# Patient Record
Sex: Male | Born: 1965 | Race: Black or African American | Hispanic: No | Marital: Single | Smoking: Current every day smoker
Health system: Southern US, Community
[De-identification: ages and names within clinical notes are randomized; demographics above are authoritative.]

## PROBLEM LIST (undated history)

## (undated) DIAGNOSIS — F1010 Alcohol abuse, uncomplicated: Secondary | ICD-10-CM

## (undated) DIAGNOSIS — Z720 Tobacco use: Secondary | ICD-10-CM

## (undated) DIAGNOSIS — F1410 Cocaine abuse, uncomplicated: Secondary | ICD-10-CM

## (undated) DIAGNOSIS — I509 Heart failure, unspecified: Secondary | ICD-10-CM

## (undated) DIAGNOSIS — E119 Type 2 diabetes mellitus without complications: Secondary | ICD-10-CM

## (undated) DIAGNOSIS — I10 Essential (primary) hypertension: Secondary | ICD-10-CM

## (undated) DIAGNOSIS — I161 Hypertensive emergency: Secondary | ICD-10-CM

## (undated) HISTORY — DX: Hypertensive emergency: I16.1

---

## 1999-04-14 ENCOUNTER — Inpatient Hospital Stay (HOSPITAL_COMMUNITY): Admission: EM | Admit: 1999-04-14 | Discharge: 1999-04-17 | Payer: Self-pay | Admitting: *Deleted

## 1999-05-29 ENCOUNTER — Inpatient Hospital Stay (HOSPITAL_COMMUNITY): Admission: EM | Admit: 1999-05-29 | Discharge: 1999-06-02 | Payer: Self-pay | Admitting: *Deleted

## 1999-06-05 ENCOUNTER — Other Ambulatory Visit (HOSPITAL_COMMUNITY): Admission: RE | Admit: 1999-06-05 | Discharge: 1999-06-13 | Payer: Self-pay | Admitting: Psychiatry

## 2021-04-11 ENCOUNTER — Emergency Department (HOSPITAL_COMMUNITY)
Admission: EM | Admit: 2021-04-11 | Discharge: 2021-04-12 | Disposition: A | Discharge status: Home / Self Care | Payer: Medicaid Other | Attending: Emergency Medicine | Admitting: Emergency Medicine

## 2021-04-11 ENCOUNTER — Emergency Department (HOSPITAL_COMMUNITY): Payer: Medicaid Other

## 2021-04-11 ENCOUNTER — Encounter (HOSPITAL_COMMUNITY): Payer: Self-pay | Admitting: Emergency Medicine

## 2021-04-11 ENCOUNTER — Other Ambulatory Visit: Payer: Self-pay

## 2021-04-11 DIAGNOSIS — E119 Type 2 diabetes mellitus without complications: Secondary | ICD-10-CM | POA: Insufficient documentation

## 2021-04-11 DIAGNOSIS — I10 Essential (primary) hypertension: Secondary | ICD-10-CM | POA: Diagnosis not present

## 2021-04-11 DIAGNOSIS — Z20822 Contact with and (suspected) exposure to covid-19: Secondary | ICD-10-CM | POA: Diagnosis not present

## 2021-04-11 DIAGNOSIS — J36 Peritonsillar abscess: Secondary | ICD-10-CM | POA: Diagnosis not present

## 2021-04-11 DIAGNOSIS — E876 Hypokalemia: Secondary | ICD-10-CM | POA: Insufficient documentation

## 2021-04-11 DIAGNOSIS — J029 Acute pharyngitis, unspecified: Secondary | ICD-10-CM

## 2021-04-11 DIAGNOSIS — R0602 Shortness of breath: Secondary | ICD-10-CM | POA: Diagnosis present

## 2021-04-11 HISTORY — DX: Type 2 diabetes mellitus without complications: E11.9

## 2021-04-11 HISTORY — DX: Essential (primary) hypertension: I10

## 2021-04-11 LAB — BASIC METABOLIC PANEL
Anion gap: 12 (ref 5–15)
BUN: 7 mg/dL (ref 6–20)
CO2: 22 mmol/L (ref 22–32)
Calcium: 9.3 mg/dL (ref 8.9–10.3)
Chloride: 104 mmol/L (ref 98–111)
Creatinine, Ser: 0.88 mg/dL (ref 0.61–1.24)
GFR, Estimated: >60 mL/min (ref >60)
Glucose, Bld: 88 mg/dL (ref 70–99)
Potassium: 3.4 mmol/L — ABNORMAL LOW (ref 3.5–5.1)
Sodium: 138 mmol/L (ref 135–145)

## 2021-04-11 LAB — CBC WITH DIFFERENTIAL/PLATELET
Abs Immature Granulocytes: 0.06 10*3/uL (ref 0.00–0.07)
Basophils Absolute: 0 10*3/uL (ref 0.0–0.1)
Basophils Relative: 0 %
Eosinophils Absolute: 0 10*3/uL (ref 0.0–0.5)
Eosinophils Relative: 0 %
HCT: 45.2 % (ref 39.0–52.0)
Hemoglobin: 15.1 g/dL (ref 13.0–17.0)
Immature Granulocytes: 1 %
Lymphocytes Relative: 12 %
Lymphs Abs: 1.4 10*3/uL (ref 0.7–4.0)
MCH: 28 pg (ref 26.0–34.0)
MCHC: 33.4 g/dL (ref 30.0–36.0)
MCV: 83.9 fL (ref 80.0–100.0)
Monocytes Absolute: 1.3 10*3/uL — ABNORMAL HIGH (ref 0.1–1.0)
Monocytes Relative: 11 %
Neutro Abs: 9.1 10*3/uL — ABNORMAL HIGH (ref 1.7–7.7)
Neutrophils Relative %: 76 %
Platelets: 258 10*3/uL (ref 150–400)
RBC: 5.39 MIL/uL (ref 4.22–5.81)
RDW: 13.9 % (ref 11.5–15.5)
WBC: 11.9 10*3/uL — ABNORMAL HIGH (ref 4.0–10.5)
nRBC: 0 % (ref 0.0–0.2)

## 2021-04-11 MED ORDER — ACETAMINOPHEN 325 MG PO TABS
650.0000 mg | ORAL_TABLET | Freq: Once | ORAL | Status: AC
  Administered 2021-04-11: 650 mg via ORAL

## 2021-04-11 NOTE — ED Triage Notes (Signed)
Pt reports cough, "hard to breath", sore throat and feeling poorly.  States he has been dizzy b/c he has not taken his bp medications.

## 2021-04-11 NOTE — ED Provider Notes (Signed)
Emergency Medicine Provider Triage Evaluation Note  Joseph Reese , a 55 y.o. male  was evaluated in triage.  Pt complains of productive cough, shortness of breath, sore throat, and generalized weakness for about a week.  Patient reports he has not been taking his blood pressure medication for the past 3 months, and has had some intermittent dizziness and blurred vision.  He is tolerating p.o.  Review of Systems  Positive: Productive cough, sore throat, weakness Negative: Chest pain, abdominal pain, N/V/D  Physical Exam  BP (!) 162/107   Pulse (!) 109   Temp (!) 102 F (38.9 C) (Oral)   Resp 18   Ht 5\' 9"  (1.753 m)   Wt 97.5 kg   SpO2 98%   BMI 31.75 kg/m  Gen:   Awake, no distress   Resp:  Normal effort  MSK:   Moves extremities without difficulty  Other:  Expiratory wheezing and diffuse rhonchorous lung sounds in all fields  Medical Decision Making  Medically screening exam initiated at 10:02 PM.  Appropriate orders placed.  was informed that the remainder of the evaluation will be completed by another provider, this initial triage assessment does not replace that evaluation, and the importance of remaining in the ED until their evaluation is complete.     [name] 04/11/21 2203    2204, MD 04/17/21 1030

## 2021-04-12 ENCOUNTER — Emergency Department (HOSPITAL_COMMUNITY): Payer: Medicaid Other

## 2021-04-12 LAB — RESP PANEL BY RT-PCR (FLU A&B, COVID) ARPGX2
Influenza A by PCR: NEGATIVE
Influenza B by PCR: NEGATIVE
SARS Coronavirus 2 by RT PCR: NEGATIVE

## 2021-04-12 MED ORDER — DEXAMETHASONE SODIUM PHOSPHATE 10 MG/ML IJ SOLN
10.0000 mg | Freq: Once | INTRAMUSCULAR | Status: AC
  Administered 2021-04-12: 10 mg via INTRAVENOUS
  Filled 2021-04-12: qty 1

## 2021-04-12 MED ORDER — IOHEXOL 300 MG/ML  SOLN
75.0000 mL | Freq: Once | INTRAMUSCULAR | Status: AC | PRN
  Administered 2021-04-12: 75 mL via INTRAVENOUS

## 2021-04-12 MED ORDER — KETOROLAC TROMETHAMINE 15 MG/ML IJ SOLN
15.0000 mg | Freq: Once | INTRAMUSCULAR | Status: AC
  Administered 2021-04-12: 15 mg via INTRAVENOUS
  Filled 2021-04-12: qty 1

## 2021-04-12 MED ORDER — HYDROCODONE-ACETAMINOPHEN 7.5-325 MG/15ML PO SOLN: 15.0000 mL | Freq: Four times a day (QID) | ORAL | 0 refills | Status: DC | PRN

## 2021-04-12 MED ORDER — AMOXICILLIN-POT CLAVULANATE 875-125 MG PO TABS: 1.0000 | ORAL_TABLET | Freq: Two times a day (BID) | ORAL | 0 refills | Status: DC

## 2021-04-12 MED ORDER — NAPROXEN 250 MG PO TABS
500.0000 mg | ORAL_TABLET | Freq: Once | ORAL | Status: AC
  Administered 2021-04-12: 500 mg via ORAL
  Filled 2021-04-12: qty 2

## 2021-04-12 MED ORDER — AZITHROMYCIN 250 MG PO TABS: ORAL_TABLET | ORAL | 0 refills | Status: DC

## 2021-04-12 MED ORDER — SODIUM CHLORIDE 0.9 % IV SOLN
3.0000 g | Freq: Once | INTRAVENOUS | Status: AC
  Administered 2021-04-12: 3 g via INTRAVENOUS
  Filled 2021-04-12: qty 8

## 2021-04-12 MED ORDER — SODIUM CHLORIDE 0.9 % IV BOLUS
500.0000 mL | Freq: Once | INTRAVENOUS | Status: AC
  Administered 2021-04-12: 500 mL via INTRAVENOUS

## 2021-04-12 MED ORDER — CLINDAMYCIN PHOSPHATE 300 MG/50ML IV SOLN: 300.0000 mg | Freq: Once | INTRAVENOUS | Status: DC

## 2021-04-12 NOTE — ED Provider Notes (Signed)
[facility] EMERGENCY DEPARTMENT Provider Note   CSN: [id number] Arrival date & time: 04/11/21  2030     History Chief Complaint  Patient presents with   Shortness of Breath   Cough    Joseph Reese is a 55 y.o. male.  Patient presents with productive cough, lightheadedness, sore throat and weakness for almost 1 week.  Patient not been taking his blood pressure meds for 3 months due to financial reasons.  Intermittent dizziness.  No stroke symptoms or signs.  Tolerating oral liquids.  No chest pain or shortness of breath.  No fevers.      Past Medical History:  Diagnosis Date   Diabetes mellitus without complication (HCC)    Hypertension     There are no problems to display for this patient.        No family history on file.     Home Medications Prior to Admission medications   Medication Sig Start Date End Date Taking? Authorizing Provider  amoxicillin-clavulanate (AUGMENTIN) 875-125 MG tablet Take 1 tablet by mouth every 12 (twelve) hours. 04/12/21  Yes Lorre Nick, MD  HYDROcodone-acetaminophen (HYCET) 7.5-325 mg/15 ml solution Take 15 mLs by mouth every 6 (six) hours as needed for moderate pain. 04/12/21  Yes Lorre Nick, MD    Allergies    Patient has no known allergies.  Review of Systems   Review of Systems  Constitutional:  Negative for chills and fever.  HENT:  Positive for congestion.   Eyes:  Negative for visual disturbance.  Respiratory:  Positive for cough. Negative for shortness of breath.   Cardiovascular:  Negative for chest pain.  Gastrointestinal:  Negative for abdominal pain and vomiting.  Genitourinary:  Negative for dysuria and flank pain.  Musculoskeletal:  Positive for arthralgias. Negative for back pain, neck pain and neck stiffness.  Skin:  Negative for rash.  Neurological:  Positive for light-headedness. Negative for headaches.   Physical Exam Updated Vital Signs BP (!) 152/108   Pulse (!) 105   Temp  98.5 F (36.9 C) (Oral)   Resp 18   Ht 5\' 9"  (1.753 m)   Wt 97.5 kg   SpO2 97%   BMI 31.75 kg/m   Physical Exam Vitals and nursing note reviewed.  Constitutional:      General: He is not in acute distress.    Appearance: He is well-developed.  HENT:     Head: Normocephalic and atraumatic.     Comments: Posterior pharyngeal edema, garbled voice, no stridor, no trismus, mild deviation of uvula    Mouth/Throat:     Mouth: Mucous membranes are moist.     Pharynx: No pharyngeal swelling or oropharyngeal exudate.  Eyes:     General:        Right eye: No discharge.        Left eye: No discharge.     Conjunctiva/sclera: Conjunctivae normal.  Neck:     Trachea: No tracheal deviation.  Cardiovascular:     Rate and Rhythm: Normal rate and regular rhythm.     Heart sounds: No murmur heard. Pulmonary:     Effort: Pulmonary effort is normal.     Breath sounds: Normal breath sounds.  Abdominal:     General: There is no distension.     Palpations: Abdomen is soft.     Tenderness: There is no abdominal tenderness. There is no guarding.  Musculoskeletal:     Cervical back: Normal range of motion and neck supple. No rigidity.  Right lower leg: No edema.     Left lower leg: No edema.  Lymphadenopathy:     Cervical: Cervical adenopathy present.  Skin:    General: Skin is warm.     Capillary Refill: Capillary refill takes less than 2 seconds.     Findings: No rash.  Neurological:     General: No focal deficit present.     Mental Status: He is alert.     Cranial Nerves: No cranial nerve deficit.  Psychiatric:        Mood and Affect: Mood normal.    ED Results / Procedures / Treatments   Labs (all labs ordered are listed, but only abnormal results are displayed) Labs Reviewed  CBC WITH DIFFERENTIAL/PLATELET - Abnormal; Notable for the following components:      Result Value   WBC 11.9 (*)    Neutro Abs 9.1 (*)    Monocytes Absolute 1.3 (*)    All other components within  normal limits  BASIC METABOLIC PANEL - Abnormal; Notable for the following components:   Potassium 3.4 (*)    All other components within normal limits  RESP PANEL BY RT-PCR (FLU A&B, COVID) ARPGX2    EKG None  Radiology DG Chest 2 View  Result Date: 04/11/2021 CLINICAL DATA:  Cough and difficulty breathing. EXAM: CHEST - 2 VIEW COMPARISON:  None. FINDINGS: Low lung volumes are seen with subsequent crowding of the bibasilar bronchovascular lung markings. Very mild bibasilar atelectasis is also noted. There is no evidence of acute infiltrate, pleural effusion or pneumothorax. The heart size and mediastinal contours are within normal limits. The visualized skeletal structures are unremarkable. IMPRESSION: Low lung volumes without acute or active cardiopulmonary disease. Electronically Signed   By: Virgina Norfolk M.D.   On: 04/11/2021 23:04   CT Soft Tissue Neck W Contrast  Result Date: 04/12/2021 CLINICAL DATA:  Neck deep tissue abscess.  Hoarseness.  Neck pain EXAM: CT NECK WITH CONTRAST TECHNIQUE: Multidetector CT imaging of the neck was performed using the standard protocol following the bolus administration of intravenous contrast. CONTRAST:  85mL OMNIPAQUE IOHEXOL 300 MG/ML  SOLN COMPARISON:  None. FINDINGS: Pharynx and larynx: Extensive mucosal and submucosal edema involving the left lateral pharyngeal wall and tonsil. This extends down to the piriform sinus. Within the edema there are 2 small areas of fluid density compatible with small abscesses measuring approximately 10 mm each. Airway is displaced to the right. Mild narrowing of the supraglottic airway. Salivary glands: No inflammation, mass, or stone. Thyroid: Negative Lymph nodes: No enlarged or necrotic lymph nodes in the neck. Vascular: Normal vascular enhancement. Limited intracranial: Negative Visualized orbits: Fracture left orbital floor which appears chronic. Mastoids and visualized paranasal sinuses: Mild mucosal edema right  ethmoid sinuses. Remaining sinuses clear. Mastoid clear. Skeleton: Cervical spondylosis causing spinal and foraminal stenosis at multiple levels. No acute skeletal abnormality. Upper chest: Lung apices clear bilaterally. Other: None IMPRESSION: Extensive soft tissue edema in the left lateral pharynx. The airway is displaced to the right and mildly narrowed. There are 2 small areas of abscess formation within the parapharyngeal edema on the left. Findings compatible with acute infection. Electronically Signed   By: Franchot Gallo M.D.   On: 04/12/2021 18:31    Procedures Procedures   Medications Ordered in ED Medications  acetaminophen (TYLENOL) tablet 650 mg (650 mg Oral Given 04/11/21 2202)  naproxen (NAPROSYN) tablet 500 mg (500 mg Oral Given 04/12/21 0204)  ketorolac (TORADOL) 15 MG/ML injection 15 mg (15 mg Intravenous  Given 04/12/21 1525)  dexamethasone (DECADRON) injection 10 mg (10 mg Intravenous Given 04/12/21 1525)  sodium chloride 0.9 % bolus 500 mL (0 mLs Intravenous Stopped 04/12/21 1610)  iohexol (OMNIPAQUE) 300 MG/ML solution 75 mL (75 mLs Intravenous Contrast Given 04/12/21 1753)  Ampicillin-Sulbactam (UNASYN) 3 g in sodium chloride 0.9 % 100 mL IVPB (0 g Intravenous Stopped 04/12/21 2045)    ED Course  I have reviewed the triage vital signs and the nursing notes.  Pertinent labs & imaging results that were available during my care of the patient were reviewed by me and considered in my medical decision making (see chart for details).    MDM Rules/Calculators/A&P                           Patient presents with clinical concern for pharyngitis either bacterial/viral versus viral syndrome versus atypical pneumonia.  Chest x-ray reviewed no acute infiltrate.  Blood work reviewed reassuring white count 11.9, potassium mild low 3.4.  Patient's vital signs improved with medications.  Patient has not taken blood pressure meds he understands he needs to start taking them and  follow-up outpatient.   On reassessment patient has muffled voice, initially in the hallway and able to get further details on pharyngeal exam with significant edema.  Differential includes peritonsillar abscess/deep space infection.  Discussed with nursing IV placed and plan for CT scan to look for PTA, steroid dose, pain meds and IV fluids.  Final Clinical Impression(s) / ED Diagnoses Final diagnoses:  Acute pharyngitis, unspecified etiology  Primary hypertension  Peritonsillar abscess    Rx / DC Orders ED Discharge Orders          Ordered    azithromycin (ZITHROMAX Z-PAK) 250 MG tablet  Status:  Discontinued        04/12/21 1449    amoxicillin-clavulanate (AUGMENTIN) 875-125 MG tablet  Every 12 hours        04/12/21 2029    HYDROcodone-acetaminophen (HYCET) 7.5-325 mg/15 ml solution  Every 6 hours PRN        04/12/21 2029             Elnora Morrison, MD 04/13/21 1320

## 2021-04-12 NOTE — ED Provider Notes (Signed)
Patient signed to me by Dr. Jodi Mourning pending results of his neck CT was done showed small abscesses.  Patient received therapy from doctor's [facility] earlier and his swallowing is greatly improved.  He has no trismus.  He denies any trouble breathing..  Case discussed with ENT on-call, Dr. Annalee Genta, who recommends patient get a dose of IV Unasyn here and be placed on Augmentin and he will see the patient in follow-up.   Lorre Nick, MD 04/12/21 2028

## 2021-04-12 NOTE — ED Notes (Signed)
Patient friend, Gildardo Pounds, calling to check on the patient, [phone].

## 2021-04-12 NOTE — ED Notes (Signed)
Called for patient no answer

## 2021-05-06 ENCOUNTER — Inpatient Hospital Stay (HOSPITAL_COMMUNITY)
Admission: EM | Admit: 2021-05-06 | Discharge: 2021-05-09 | DRG: 291 | Disposition: A | Discharge status: Home / Self Care | Payer: Medicaid Other | Attending: Internal Medicine | Admitting: Internal Medicine

## 2021-05-06 ENCOUNTER — Encounter (HOSPITAL_COMMUNITY): Payer: Self-pay

## 2021-05-06 ENCOUNTER — Emergency Department (HOSPITAL_COMMUNITY): Payer: Medicaid Other

## 2021-05-06 ENCOUNTER — Other Ambulatory Visit: Payer: Self-pay

## 2021-05-06 ENCOUNTER — Observation Stay (HOSPITAL_COMMUNITY): Payer: Medicaid Other

## 2021-05-06 DIAGNOSIS — E669 Obesity, unspecified: Secondary | ICD-10-CM | POA: Diagnosis present

## 2021-05-06 DIAGNOSIS — E1165 Type 2 diabetes mellitus with hyperglycemia: Secondary | ICD-10-CM | POA: Diagnosis present

## 2021-05-06 DIAGNOSIS — I110 Hypertensive heart disease with heart failure: Secondary | ICD-10-CM | POA: Diagnosis present

## 2021-05-06 DIAGNOSIS — E876 Hypokalemia: Secondary | ICD-10-CM | POA: Diagnosis present

## 2021-05-06 DIAGNOSIS — F1010 Alcohol abuse, uncomplicated: Secondary | ICD-10-CM | POA: Diagnosis present

## 2021-05-06 DIAGNOSIS — J810 Acute pulmonary edema: Secondary | ICD-10-CM

## 2021-05-06 DIAGNOSIS — I429 Cardiomyopathy, unspecified: Secondary | ICD-10-CM | POA: Diagnosis present

## 2021-05-06 DIAGNOSIS — Z6831 Body mass index (BMI) 31.0-31.9, adult: Secondary | ICD-10-CM | POA: Diagnosis not present

## 2021-05-06 DIAGNOSIS — F17210 Nicotine dependence, cigarettes, uncomplicated: Secondary | ICD-10-CM | POA: Diagnosis present

## 2021-05-06 DIAGNOSIS — Z833 Family history of diabetes mellitus: Secondary | ICD-10-CM | POA: Diagnosis not present

## 2021-05-06 DIAGNOSIS — Z5986 Financial insecurity: Secondary | ICD-10-CM

## 2021-05-06 DIAGNOSIS — Z8249 Family history of ischemic heart disease and other diseases of the circulatory system: Secondary | ICD-10-CM

## 2021-05-06 DIAGNOSIS — I5023 Acute on chronic systolic (congestive) heart failure: Secondary | ICD-10-CM | POA: Diagnosis present

## 2021-05-06 DIAGNOSIS — E785 Hyperlipidemia, unspecified: Secondary | ICD-10-CM | POA: Diagnosis present

## 2021-05-06 DIAGNOSIS — Z20822 Contact with and (suspected) exposure to covid-19: Secondary | ICD-10-CM | POA: Diagnosis present

## 2021-05-06 DIAGNOSIS — F1410 Cocaine abuse, uncomplicated: Secondary | ICD-10-CM | POA: Diagnosis present

## 2021-05-06 DIAGNOSIS — Z9114 Patient's other noncompliance with medication regimen: Secondary | ICD-10-CM

## 2021-05-06 DIAGNOSIS — Z79899 Other long term (current) drug therapy: Secondary | ICD-10-CM | POA: Diagnosis not present

## 2021-05-06 DIAGNOSIS — I161 Hypertensive emergency: Secondary | ICD-10-CM | POA: Diagnosis present

## 2021-05-06 DIAGNOSIS — R739 Hyperglycemia, unspecified: Secondary | ICD-10-CM

## 2021-05-06 DIAGNOSIS — J9601 Acute respiratory failure with hypoxia: Secondary | ICD-10-CM

## 2021-05-06 HISTORY — DX: Acute respiratory failure with hypoxia: J96.01

## 2021-05-06 HISTORY — DX: Acute pulmonary edema: J81.0

## 2021-05-06 LAB — ECHOCARDIOGRAM COMPLETE
AR max vel: 3.69 cm2
AV Area VTI: 4.18 cm2
AV Area mean vel: 3.26 cm2
AV Mean grad: 3 mmHg
AV Peak grad: 5.5 mmHg
Ao pk vel: 1.17 m/s
Height: 69 in
MV VTI: 3.39 cm2
S' Lateral: 5.1 cm
Weight: 3456.81 oz

## 2021-05-06 LAB — CBC WITH DIFFERENTIAL/PLATELET
Abs Immature Granulocytes: 0.09 10*3/uL — ABNORMAL HIGH (ref 0.00–0.07)
Basophils Absolute: 0 10*3/uL (ref 0.0–0.1)
Basophils Relative: 0 %
Eosinophils Absolute: 0.2 10*3/uL (ref 0.0–0.5)
Eosinophils Relative: 4 %
HCT: 44.7 % (ref 39.0–52.0)
Hemoglobin: 14 g/dL (ref 13.0–17.0)
Immature Granulocytes: 1 %
Lymphocytes Relative: 47 %
Lymphs Abs: 3.2 10*3/uL (ref 0.7–4.0)
MCH: 26.8 pg (ref 26.0–34.0)
MCHC: 31.3 g/dL (ref 30.0–36.0)
MCV: 85.6 fL (ref 80.0–100.0)
Monocytes Absolute: 0.4 10*3/uL (ref 0.1–1.0)
Monocytes Relative: 6 %
Neutro Abs: 2.9 10*3/uL (ref 1.7–7.7)
Neutrophils Relative %: 42 %
Platelets: 266 10*3/uL (ref 150–400)
RBC: 5.22 MIL/uL (ref 4.22–5.81)
RDW: 13.7 % (ref 11.5–15.5)
WBC: 6.9 10*3/uL (ref 4.0–10.5)
nRBC: 0 % (ref 0.0–0.2)

## 2021-05-06 LAB — RAPID URINE DRUG SCREEN, HOSP PERFORMED
Amphetamines: NOT DETECTED
Barbiturates: NOT DETECTED
Benzodiazepines: NOT DETECTED
Cocaine: POSITIVE — AB
Opiates: NOT DETECTED
Tetrahydrocannabinol: NOT DETECTED

## 2021-05-06 LAB — BASIC METABOLIC PANEL
Anion gap: 8 (ref 5–15)
BUN: 12 mg/dL (ref 6–20)
CO2: 23 mmol/L (ref 22–32)
Calcium: 8.8 mg/dL — ABNORMAL LOW (ref 8.9–10.3)
Chloride: 107 mmol/L (ref 98–111)
Creatinine, Ser: 1 mg/dL (ref 0.61–1.24)
GFR, Estimated: >60 mL/min (ref >60)
Glucose, Bld: 97 mg/dL (ref 70–99)
Potassium: 4.2 mmol/L (ref 3.5–5.1)
Sodium: 138 mmol/L (ref 135–145)

## 2021-05-06 LAB — CBC
HCT: 45.4 % (ref 39.0–52.0)
Hemoglobin: 14.7 g/dL (ref 13.0–17.0)
MCH: 27.2 pg (ref 26.0–34.0)
MCHC: 32.4 g/dL (ref 30.0–36.0)
MCV: 84.1 fL (ref 80.0–100.0)
Platelets: 267 10*3/uL (ref 150–400)
RBC: 5.4 MIL/uL (ref 4.22–5.81)
RDW: 13.8 % (ref 11.5–15.5)
WBC: 7.4 10*3/uL (ref 4.0–10.5)
nRBC: 0 % (ref 0.0–0.2)

## 2021-05-06 LAB — COMPREHENSIVE METABOLIC PANEL
ALT: 32 U/L (ref 0–44)
AST: 41 U/L (ref 15–41)
Albumin: 3.2 g/dL — ABNORMAL LOW (ref 3.5–5.0)
Alkaline Phosphatase: 74 U/L (ref 38–126)
Anion gap: 11 (ref 5–15)
BUN: 11 mg/dL (ref 6–20)
CO2: 19 mmol/L — ABNORMAL LOW (ref 22–32)
Calcium: 8.5 mg/dL — ABNORMAL LOW (ref 8.9–10.3)
Chloride: 106 mmol/L (ref 98–111)
Creatinine, Ser: 1.23 mg/dL (ref 0.61–1.24)
GFR, Estimated: >60 mL/min (ref >60)
Glucose, Bld: 294 mg/dL — ABNORMAL HIGH (ref 70–99)
Potassium: 3.3 mmol/L — ABNORMAL LOW (ref 3.5–5.1)
Sodium: 136 mmol/L (ref 135–145)
Total Bilirubin: 0.6 mg/dL (ref 0.3–1.2)
Total Protein: 7.4 g/dL (ref 6.5–8.1)

## 2021-05-06 LAB — RESP PANEL BY RT-PCR (FLU A&B, COVID) ARPGX2
Influenza A by PCR: NEGATIVE
Influenza B by PCR: NEGATIVE
SARS Coronavirus 2 by RT PCR: NEGATIVE

## 2021-05-06 LAB — I-STAT ARTERIAL BLOOD GAS, ED
Acid-base deficit: 4 mmol/L — ABNORMAL HIGH (ref 0.0–2.0)
Bicarbonate: 23.2 mmol/L (ref 20.0–28.0)
Calcium, Ion: 1.24 mmol/L (ref 1.15–1.40)
HCT: 43 % (ref 39.0–52.0)
Hemoglobin: 14.6 g/dL (ref 13.0–17.0)
O2 Saturation: 96 %
Potassium: 3.2 mmol/L — ABNORMAL LOW (ref 3.5–5.1)
Sodium: 141 mmol/L (ref 135–145)
TCO2: 25 mmol/L (ref 22–32)
pCO2 arterial: 50.1 mmHg — ABNORMAL HIGH (ref 32.0–48.0)
pH, Arterial: 7.274 — ABNORMAL LOW (ref 7.350–7.450)
pO2, Arterial: 95 mmHg (ref 83.0–108.0)

## 2021-05-06 LAB — CREATININE, SERUM
Creatinine, Ser: 1.05 mg/dL (ref 0.61–1.24)
GFR, Estimated: >60 mL/min (ref >60)

## 2021-05-06 LAB — HEMOGLOBIN A1C
Hgb A1c MFr Bld: 6.4 % — ABNORMAL HIGH (ref 4.8–5.6)
Mean Plasma Glucose: 136.98 mg/dL

## 2021-05-06 LAB — LIPID PANEL
Cholesterol: 296 mg/dL — ABNORMAL HIGH (ref 0–200)
HDL: 55 mg/dL (ref >40)
LDL Cholesterol: 219 mg/dL — ABNORMAL HIGH (ref 0–99)
Total CHOL/HDL Ratio: 5.4 RATIO
Triglycerides: 111 mg/dL (ref <150)
VLDL: 22 mg/dL (ref 0–40)

## 2021-05-06 LAB — PROTIME-INR
INR: 1 (ref 0.8–1.2)
Prothrombin Time: 12.9 seconds (ref 11.4–15.2)

## 2021-05-06 LAB — HIV ANTIBODY (ROUTINE TESTING W REFLEX): HIV Screen 4th Generation wRfx: NONREACTIVE

## 2021-05-06 LAB — APTT: aPTT: 27 seconds (ref 24–36)

## 2021-05-06 LAB — TSH: TSH: 0.746 u[IU]/mL (ref 0.350–4.500)

## 2021-05-06 LAB — MAGNESIUM: Magnesium: 2.1 mg/dL (ref 1.7–2.4)

## 2021-05-06 LAB — TROPONIN I (HIGH SENSITIVITY)
Troponin I (High Sensitivity): 32 ng/L — ABNORMAL HIGH (ref <18)
Troponin I (High Sensitivity): 58 ng/L — ABNORMAL HIGH (ref <18)
Troponin I (High Sensitivity): 54 ng/L — ABNORMAL HIGH (ref <18)
Troponin I (High Sensitivity): 65 ng/L — ABNORMAL HIGH (ref <18)

## 2021-05-06 LAB — BRAIN NATRIURETIC PEPTIDE: B Natriuretic Peptide: 571.8 pg/mL — ABNORMAL HIGH (ref 0.0–100.0)

## 2021-05-06 MED ORDER — FUROSEMIDE 10 MG/ML IJ SOLN
40.0000 mg | Freq: Every day | INTRAMUSCULAR | Status: DC
  Administered 2021-05-07 – 2021-05-09 (×3): 40 mg via INTRAVENOUS
  Filled 2021-05-06 (×3): qty 4

## 2021-05-06 MED ORDER — LOSARTAN POTASSIUM 50 MG PO TABS: 50.0000 mg | ORAL_TABLET | Freq: Every day | ORAL | Status: DC

## 2021-05-06 MED ORDER — FUROSEMIDE 10 MG/ML IJ SOLN
40.0000 mg | Freq: Once | INTRAMUSCULAR | Status: AC
  Administered 2021-05-06: 05:00:00 40 mg via INTRAVENOUS
  Filled 2021-05-06: qty 4

## 2021-05-06 MED ORDER — SODIUM CHLORIDE 0.9 % IV SOLN: INTRAVENOUS | Status: DC

## 2021-05-06 MED ORDER — LOSARTAN POTASSIUM 50 MG PO TABS
50.0000 mg | ORAL_TABLET | Freq: Every day | ORAL | Status: DC
  Administered 2021-05-06 – 2021-05-09 (×4): 50 mg via ORAL
  Filled 2021-05-06 (×4): qty 1

## 2021-05-06 MED ORDER — ENOXAPARIN SODIUM 60 MG/0.6ML IJ SOSY
50.0000 mg | PREFILLED_SYRINGE | INTRAMUSCULAR | Status: DC
  Administered 2021-05-06 – 2021-05-08 (×3): 50 mg via SUBCUTANEOUS
  Filled 2021-05-06 (×2): qty 0.6
  Filled 2021-05-06: qty 0.5

## 2021-05-06 MED ORDER — LORAZEPAM 2 MG/ML IJ SOLN
0.5000 mg | Freq: Once | INTRAMUSCULAR | Status: AC
  Administered 2021-05-06: 03:00:00 0.5 mg via INTRAVENOUS
  Filled 2021-05-06: qty 1

## 2021-05-06 MED ORDER — LORAZEPAM 1 MG PO TABS: 1.0000 mg | ORAL_TABLET | ORAL | Status: DC | PRN

## 2021-05-06 MED ORDER — THIAMINE HCL 100 MG/ML IJ SOLN
100.0000 mg | Freq: Every day | INTRAMUSCULAR | Status: DC
  Filled 2021-05-06: qty 2

## 2021-05-06 MED ORDER — ADULT MULTIVITAMIN W/MINERALS CH
1.0000 | ORAL_TABLET | Freq: Every day | ORAL | Status: DC
  Administered 2021-05-06 – 2021-05-09 (×4): 1 via ORAL
  Filled 2021-05-06 (×4): qty 1

## 2021-05-06 MED ORDER — ACETAMINOPHEN 325 MG PO TABS
650.0000 mg | ORAL_TABLET | Freq: Four times a day (QID) | ORAL | Status: DC | PRN
  Administered 2021-05-06 – 2021-05-08 (×2): 650 mg via ORAL
  Filled 2021-05-06 (×2): qty 2

## 2021-05-06 MED ORDER — FOLIC ACID 1 MG PO TABS
1.0000 mg | ORAL_TABLET | Freq: Every day | ORAL | Status: DC
  Administered 2021-05-06 – 2021-05-09 (×4): 1 mg via ORAL
  Filled 2021-05-06 (×4): qty 1

## 2021-05-06 MED ORDER — HYDRALAZINE HCL 20 MG/ML IJ SOLN: 10.0000 mg | INTRAMUSCULAR | Status: DC | PRN

## 2021-05-06 MED ORDER — ATORVASTATIN CALCIUM 40 MG PO TABS
40.0000 mg | ORAL_TABLET | Freq: Every day | ORAL | Status: DC
  Administered 2021-05-06 – 2021-05-09 (×4): 40 mg via ORAL
  Filled 2021-05-06 (×4): qty 1

## 2021-05-06 MED ORDER — FUROSEMIDE 10 MG/ML IJ SOLN
40.0000 mg | Freq: Two times a day (BID) | INTRAMUSCULAR | Status: DC
  Administered 2021-05-06: 13:00:00 40 mg via INTRAVENOUS
  Filled 2021-05-06: qty 4

## 2021-05-06 MED ORDER — NITROGLYCERIN IN D5W 200-5 MCG/ML-% IV SOLN: 0.0000 ug/min | INTRAVENOUS | Status: DC

## 2021-05-06 MED ORDER — ASPIRIN 81 MG PO CHEW
324.0000 mg | CHEWABLE_TABLET | Freq: Once | ORAL | Status: AC
  Administered 2021-05-06: 03:00:00 324 mg via ORAL
  Filled 2021-05-06: qty 4

## 2021-05-06 MED ORDER — POTASSIUM CHLORIDE CRYS ER 20 MEQ PO TBCR
40.0000 meq | EXTENDED_RELEASE_TABLET | Freq: Once | ORAL | Status: AC
  Administered 2021-05-06: 07:00:00 40 meq via ORAL
  Filled 2021-05-06: qty 2

## 2021-05-06 MED ORDER — THIAMINE HCL 100 MG PO TABS
100.0000 mg | ORAL_TABLET | Freq: Every day | ORAL | Status: DC
  Administered 2021-05-06 – 2021-05-09 (×4): 100 mg via ORAL
  Filled 2021-05-06 (×4): qty 1

## 2021-05-06 MED ORDER — HEPARIN SODIUM (PORCINE) 5000 UNIT/ML IJ SOLN
4000.0000 [IU] | Freq: Once | INTRAMUSCULAR | Status: DC
  Filled 2021-05-06: qty 1

## 2021-05-06 MED ORDER — LORAZEPAM 2 MG/ML IJ SOLN: 1.0000 mg | INTRAMUSCULAR | Status: DC | PRN

## 2021-05-06 MED ORDER — NITROGLYCERIN IN D5W 200-5 MCG/ML-% IV SOLN
INTRAVENOUS | Status: AC
  Filled 2021-05-06: qty 250

## 2021-05-06 NOTE — ED Provider Notes (Signed)
[facility] EMERGENCY DEPARTMENT Provider Note   CSN: [id number] Arrival date & time:        History Chief Complaint  Patient presents with   Shortness of Breath    Joseph Reese is a 55 y.o. male.  HPI     This is a 55 year old male with a history of diabetes, hypertension, hyperlipidemia, congestive heart failure who presents with shortness of breath and chest pain.  Patient presents by EMS on CPAP.  Noted to be hypoxic in the 70s upon arrival.  Patient reports that he woke up gasping for breath and "I could not take a deep breath."  Also reports chest pressure.  Denies any history of CAD but states he had a brother that died of heart attack at 33.  States that the pain is pressure-like and anterior.  It is nonradiating.  Shortness of breath was sudden onset.  No recent fevers or cough.  Denies any lower extremity swelling.  Has been unable to take his medication.  His primary physicians are in [place].  I reviewed care everywhere.  I cannot see any hospital notes but I do see a hospital follow-up that indicates an EF of approximately 35%.  Level 5 caveat for acuity of condition  Past Medical History:  Diagnosis Date   Diabetes mellitus without complication (HCC)    Hypertension     Patient Active Problem List   Diagnosis Date Noted   Hypertensive emergency     History reviewed. No pertinent surgical history.     History reviewed. No pertinent family history.     Home Medications Prior to Admission medications   Medication Sig Start Date End Date Taking? Authorizing Provider  amoxicillin-clavulanate (AUGMENTIN) 875-125 MG tablet Take 1 tablet by mouth every 12 (twelve) hours. 04/12/21   Lorre Nick, MD  HYDROcodone-acetaminophen (HYCET) 7.5-325 mg/15 ml solution Take 15 mLs by mouth every 6 (six) hours as needed for moderate pain. 04/12/21   Lorre Nick, MD    Allergies    Patient has no known allergies.  Review of Systems   Review  of Systems  Constitutional:  Negative for fever.  Respiratory:  Positive for chest tightness and shortness of breath.   Cardiovascular:  Negative for leg swelling.  Gastrointestinal:  Negative for abdominal pain, nausea and vomiting.  All other systems reviewed and are negative.  Physical Exam Updated Vital Signs BP (!) 144/94    Pulse 96    Temp 98.2 F (36.8 C) (Oral)    Resp (!) 24    Ht 1.753 m (5\' 9" )    Wt 98 kg    SpO2 98%    BMI 31.91 kg/m   Physical Exam Vitals and nursing note reviewed.  Constitutional:      Appearance: He is well-developed. He is obese. He is ill-appearing and diaphoretic.  HENT:     Head: Normocephalic and atraumatic.  Eyes:     Pupils: Pupils are equal, round, and reactive to light.  Cardiovascular:     Rate and Rhythm: Regular rhythm. Tachycardia present.     Heart sounds: Normal heart sounds. No murmur heard. Pulmonary:     Effort: Tachypnea and accessory muscle usage present. No respiratory distress.     Breath sounds: Normal breath sounds. No wheezing.     Comments: Increased work of breathing, speaking in short sentences, on BiPAP, coarse breath sounds bilaterally Abdominal:     General: Bowel sounds are normal.     Palpations: Abdomen is soft.  Tenderness: There is no abdominal tenderness. There is no rebound.  Musculoskeletal:     Cervical back: Neck supple.     Comments: Trace bilateral lower extremity edema  Lymphadenopathy:     Cervical: No cervical adenopathy.  Skin:    General: Skin is warm.  Neurological:     Mental Status: He is alert and oriented to person, place, and time.  Psychiatric:     Comments: Anxious appearing    ED Results / Procedures / Treatments   Labs (all labs ordered are listed, but only abnormal results are displayed) Labs Reviewed  CBC WITH DIFFERENTIAL/PLATELET - Abnormal; Notable for the following components:      Result Value   Abs Immature Granulocytes 0.09 (*)    All other components within  normal limits  HEMOGLOBIN A1C - Abnormal; Notable for the following components:   Hgb A1c MFr Bld 6.4 (*)    All other components within normal limits  COMPREHENSIVE METABOLIC PANEL - Abnormal; Notable for the following components:   Potassium 3.3 (*)    CO2 19 (*)    Glucose, Bld 294 (*)    Calcium 8.5 (*)    Albumin 3.2 (*)    All other components within normal limits  LIPID PANEL - Abnormal; Notable for the following components:   Cholesterol 296 (*)    LDL Cholesterol 219 (*)    All other components within normal limits  I-STAT ARTERIAL BLOOD GAS, ED - Abnormal; Notable for the following components:   pH, Arterial 7.274 (*)    pCO2 arterial 50.1 (*)    Acid-base deficit 4.0 (*)    Potassium 3.2 (*)    All other components within normal limits  TROPONIN I (HIGH SENSITIVITY) - Abnormal; Notable for the following components:   Troponin I (High Sensitivity) 32 (*)    All other components within normal limits  RESP PANEL BY RT-PCR (FLU A&B, COVID) ARPGX2  PROTIME-INR  APTT  BRAIN NATRIURETIC PEPTIDE  TROPONIN I (HIGH SENSITIVITY)    EKG EKG Interpretation  Date/Time:  Saturday May 06 2021 02:23:47 EST Ventricular Rate:  129 PR Interval:  150 QRS Duration: 100 QT Interval:  331 QTC Calculation: 485 R Axis:   67 Text Interpretation: Sinus tachycardia Left atrial enlargement LVH with secondary repolarization abnormality Anterior ST elevation No prior for comparison Confirmed by Ross Marcus ([id number]) on 05/06/2021 2:53:31 AM  Radiology DG Chest Portable 1 View  Result Date: 05/06/2021 CLINICAL DATA:  Shortness of breath. EXAM: PORTABLE CHEST 1 VIEW COMPARISON:  April 11, 2021 FINDINGS: Mild, diffusely increased interstitial lung markings are noted. Mild to moderate severity areas of atelectasis and/or infiltrate are seen within the bilateral lung bases. There is no evidence of a pleural effusion or pneumothorax. The cardiac silhouette is borderline in size and  unchanged in appearance. The visualized skeletal structures are unremarkable. IMPRESSION: Mild interstitial edema with mild to moderate severity bibasilar atelectasis and/or infiltrate. Electronically Signed   By: Aram Candela M.D.   On: 05/06/2021 02:44    Procedures .Critical Care Performed by: Shon Baton, MD Authorized by: Shon Baton, MD   Critical care provider statement:    Critical care time (minutes):  60   Critical care was necessary to treat or prevent imminent or life-threatening deterioration of the following conditions:  Respiratory failure   Critical care was time spent personally by me on the following activities:  Development of treatment plan with patient or surrogate, discussions with consultants, evaluation of patient's response  to treatment, examination of patient, ordering and review of laboratory studies, ordering and review of radiographic studies, ordering and performing treatments and interventions, pulse oximetry, re-evaluation of patient's condition and review of old charts   Medications Ordered in ED Medications  0.9 %  sodium chloride infusion ( Intravenous Not Given 05/06/21 0248)  heparin injection 4,000 Units (4,000 Units Intravenous Not Given 05/06/21 0248)  furosemide (LASIX) injection 40 mg (has no administration in time range)  aspirin chewable tablet 324 mg (324 mg Oral Given 05/06/21 0259)  LORazepam (ATIVAN) injection 0.5 mg (0.5 mg Intravenous Given 05/06/21 0259)    ED Course  I have reviewed the triage vital signs and the nursing notes.  Pertinent labs & imaging results that were available during my care of the patient were reviewed by me and considered in my medical decision making (see chart for details).  Clinical Course as of 05/06/21 0415  Sat May 06, 2021  0240 spoke with Dr. Allyson Sabal, cardiology.  Does not feel EKG consistent with STEMI.  We will continue to manage patient for likely CHF and hypertensive urgency/emergency.  [CH]  0414 On recheck, patient resting comfortably on BiPAP.  Vital signs have normalized.  O2 sat 100%, heart rate in the 90s, blood pressure has trended down without intervention.  Patient was given a dose of Lasix.  States he feels much better on BiPAP.  Will attempt to wean. [CH]    Clinical Course User Index [CH] Adysen Raphael, Mayer Masker, MD   MDM Rules/Calculators/A&P                          Patient presents in acute respiratory distress.  He has tachypnea with increased work of breathing.  Clinically highly suggestive of flash pulmonary edema and heart failure.  Reports a history of heart failure in the past but has never been on BiPAP.  He is also significantly diaphoretic and having chest discomfort.  Initial EKG shows some anterior ST elevation with lateral depressions and T wave inversions.  Could be rate related although given that he is having chest pain, will initiate STEMI.  Please see clinical course above.  This was canceled by Dr. Allyson Sabal.  Labs obtained and notable for elevated hemoglobin A1c and glucose, no leukocytosis, no significant metabolic derangements, COVID-negative.  Chest x-ray obtained and shows interstitial edema.  I feel this is consistent with patient's presentation.  Troponin slightly elevated at 32.  No prior for comparison.  Could be demand related versus ACS.  Cardiology is following and has consulted on the patient.  We will plan for admission to the hospitalist given hypoxic respiratory failure in the setting of likely CHF.     Final Clinical Impression(s) / ED Diagnoses Final diagnoses:  Acute respiratory failure with hypoxia (HCC)  Acute on chronic systolic heart failure [facility])    Rx / DC Orders ED Discharge Orders     None        Shon Baton, MD 05/06/21 [phone]

## 2021-05-06 NOTE — ED Triage Notes (Signed)
Pt BIB [facility] for eval of SOB acute onset this evening. Pt reports hx of CHF, states has not felt like this before. Pt states he started earlier this evenign w/ sx, EMS reports initial sats in the 70s, placed on CPAP en route w/ improvement of sats to 97 on arrival. EMS admin 125mg  of solumedrol en route. Pt diaphoretic and tachypneic on arrival to ED

## 2021-05-06 NOTE — ED Notes (Signed)
Per Dr. Toniann Fail, pt removed from BiPAP by this RN and tolerating well. Pt placed on 2l O2 via nasal cannula.

## 2021-05-06 NOTE — Progress Notes (Signed)
° °  Progress Note  Patient Name: Joseph Reese Date of Encounter: 05/06/2021  Primary Cardiologist:  New to [facility]  Cardiology consultation note from Dr. Deforest Hoyles reviewed.  Patient presents with hypertensive emergency and flash pulmonary edema, UDS positive for cocaine.  High-sensitivity troponin I levels are minimally elevated and flat pattern not suggestive of ACS.  Records indicate history of hypertension and type 2 diabetes mellitus.  LDL is significantly elevated at 219 as well.  Hemoglobin A1c 6.4%.  ECG shows sinus tachycardia with LVH and repolarization abnormalities.  Echocardiogram shows severe LV dysfunction with LVEF 15 to 20%, global hypokinesis with akinesis of the mid to basal inferoseptal wall, mildly reduced RV contraction as well.  Outside records indicate prior documentation of LV dysfunction as well, LVEF 25 to 30% in January 2021.  Agree with initiation of losartan for now, although he may ultimately be a candidate for Entresto.  Increase Lipitor to 80 mg daily.  Continue to optimize fluid status.  He could have a largely nonischemic cardiomyopathy with history of uncontrolled hypertension and substance abuse, although his risk factor profile is significant and he needs to have exclusion of underlying CAD.  Would suggest having him transferred over to the hospitalist service at [facility], we will follow him and ultimately make plans for a right and left heart catheterization and further medication adjustments.  Signed, Nona Dell, MD  05/06/2021, 1:30 PM

## 2021-05-06 NOTE — H&P (Signed)
History and Physical    Joseph Reese MPN:[id number] DOB: 08/03/1965 DOA: 05/06/2021  PCP: Pcp, No  Patient coming from: Home.  Chief Complaint: Shortness of breath.  HPI: Joseph Reese is a 55 y.o. male with history of hypertension, hyperlipidemia, CHF presents to the ER with sudden onset of shortness of breath when he was sleeping which woke him up from the sleep.  Denies any chest pain.  Patient states last few days he has been mildly short of breath.  Patient states he has history of hypertension which was diagnosed in 2019 and was prescribed medications which he has not taken for the last 3 months because he was not able to afford.  Admits to taking cocaine about 3 days ago drinks alcohol every day and smokes cigarettes.  ED Course: In the ER patient was found to have blood pressure systolic more than 200 diastolic more than 110.  Cardiology on-call was consulted.  EKG shows sinus tachycardia chest x-ray shows congestion.  Cardiology recommended admitting for acute pulmonary edema Lasix 40 mg IV was given but blood pressure improved before nitroglycerin could be started.  BNP is 571 isolated troponin 32 hemoglobin A1c is 6.3.  COVID test negative.  Patient admitted for acute pulmonary edema with hypertensive emergency.  Review of Systems: As per HPI, rest all negative.   Past Medical History:  Diagnosis Date   Diabetes mellitus without complication (HCC)    Hypertension     History reviewed. No pertinent surgical history.   reports that he has been smoking cigarettes. He has never used smokeless tobacco. He reports current alcohol use. He reports current drug use. Drug: Cocaine.  No Known Allergies  Family History  Problem Relation Age of Onset   Diabetes Mellitus II Brother     Prior to Admission medications   Medication Sig Start Date End Date Taking? Authorizing Provider  HYDROcodone-acetaminophen (HYCET) 7.5-325 mg/15 ml solution Take 15 mLs by mouth every 6 (six) hours as  needed for moderate pain. 04/12/21  Yes Lorre Nick, MD  amoxicillin-clavulanate (AUGMENTIN) 875-125 MG tablet Take 1 tablet by mouth every 12 (twelve) hours. Patient not taking: Reported on 05/06/2021 04/12/21   Lorre Nick, MD  atorvastatin (LIPITOR) 40 MG tablet Take 40 mg by mouth daily. Patient not taking: Reported on 05/06/2021 05/25/19   [provider]  carvedilol (COREG) 6.25 MG tablet Take 6.25 mg by mouth 2 (two) times daily. Patient not taking: Reported on 05/06/2021 05/25/19   [provider]  furosemide (LASIX) 40 MG tablet Take 40 mg by mouth daily. Patient not taking: Reported on 05/06/2021 05/25/19   [provider]  losartan (COZAAR) 50 MG tablet Take 50 mg by mouth daily. Patient not taking: Reported on 05/06/2021 05/25/19   [provider]  spironolactone (ALDACTONE) 25 MG tablet Take 25 mg by mouth daily. Patient not taking: Reported on 05/06/2021 05/25/19   [provider]    Physical Exam: Constitutional: Moderately built and nourished. Vitals:   05/06/21 0306 05/06/21 0306 05/06/21 0321 05/06/21 0525  BP:    (!) 134/100  Pulse:   96 (!) 101  Resp:   (!) 24 18  Temp: 98.2 F (36.8 C)     TempSrc: Oral     SpO2:   98% 96%  Weight:  98 kg    Height:  5\' 9"  (1.753 m)     Eyes: Anicteric no pallor. ENMT: No discharge from the ears eyes nose and mouth. Neck: JVD elevated no mass felt.  Respiratory: No rhonchi or crepitations. Cardiovascular: S1-S2 heard. Abdomen: Soft nontender bowel sound present. Musculoskeletal: No edema. Skin: No rash. Neurologic: Alert awake oriented time place and person.  Moves all extremities. Psychiatric: Appears normal.  Normal affect.   Labs on Admission: I have personally reviewed following labs and imaging studies  CBC: Recent Labs  Lab 05/06/21 0226 05/06/21 0249  WBC 6.9  --   NEUTROABS 2.9  --   HGB 14.0 14.6  HCT 44.7 43.0  MCV 85.6  --   PLT 266  --    Basic  Metabolic Panel: Recent Labs  Lab 05/06/21 0233 05/06/21 0249  NA 136 141  K 3.3* 3.2*  CL 106  --   CO2 19*  --   GLUCOSE 294*  --   BUN 11  --   CREATININE 1.23  --   CALCIUM 8.5*  --    GFR: Estimated Creatinine Clearance: 76.5 mL/min (by C-G formula based on SCr of 1.23 mg/dL). Liver Function Tests: Recent Labs  Lab 05/06/21 0233  AST 41  ALT 32  ALKPHOS 74  BILITOT 0.6  PROT 7.4  ALBUMIN 3.2*   No results for input(s): LIPASE, AMYLASE in the last 168 hours. No results for input(s): AMMONIA in the last 168 hours. Coagulation Profile: Recent Labs  Lab 05/06/21 0226  INR 1.0   Cardiac Enzymes: No results for input(s): CKTOTAL, CKMB, CKMBINDEX, TROPONINI in the last 168 hours. BNP (last 3 results) No results for input(s): PROBNP in the last 8760 hours. HbA1C: Recent Labs    05/06/21 0233  HGBA1C 6.4*   CBG: No results for input(s): GLUCAP in the last 168 hours. Lipid Profile: Recent Labs    05/06/21 0233  CHOL 296*  HDL 55  LDLCALC 219*  TRIG 111  CHOLHDL 5.4   Thyroid Function Tests: No results for input(s): TSH, T4TOTAL, FREET4, T3FREE, THYROIDAB in the last 72 hours. Anemia Panel: No results for input(s): VITAMINB12, FOLATE, FERRITIN, TIBC, IRON, RETICCTPCT in the last 72 hours. Urine analysis: No results found for: COLORURINE, APPEARANCEUR, LABSPEC, PHURINE, GLUCOSEU, HGBUR, BILIRUBINUR, KETONESUR, PROTEINUR, UROBILINOGEN, NITRITE, LEUKOCYTESUR Sepsis Labs: @LABRCNTIP (procalcitonin:4,lacticidven:4) ) Recent Results (from the past 240 hour(s))  Resp Panel by RT-PCR (Flu A&B, Covid) Nasopharyngeal Swab     Status: None   Collection Time: 05/06/21  2:27 AM   Specimen: Nasopharyngeal Swab; Nasopharyngeal(NP) swabs in vial transport medium  Result Value Ref Range Status   SARS Coronavirus 2 by RT PCR NEGATIVE NEGATIVE Final    Comment: (NOTE) SARS-CoV-2 target nucleic acids are NOT DETECTED.  The SARS-CoV-2 RNA is generally detectable in  upper respiratory specimens during the acute phase of infection. The lowest concentration of SARS-CoV-2 viral copies this assay can detect is 138 copies/mL. A negative result does not preclude SARS-Cov-2 infection and should not be used as the sole basis for treatment or other patient management decisions. A negative result may occur with  improper specimen collection/handling, submission of specimen other than nasopharyngeal swab, presence of viral mutation(s) within the areas targeted by this assay, and inadequate number of viral copies(<138 copies/mL). A negative result must be combined with clinical observations, patient history, and epidemiological information. The expected result is Negative.  Fact Sheet for Patients:  05/08/21  Fact Sheet for Healthcare Providers:  BloggerCourse.com  This test is no t yet approved or cleared by the [place] FDA and  has been authorized for detection and/or diagnosis of SARS-CoV-2 by FDA under an Emergency Use Authorization (EUA). This EUA will  remain  in effect (meaning this test can be used) for the duration of the COVID-19 declaration under Section 564(b)(1) of the Act, 21 U.S.C.section 360bbb-3(b)(1), unless the authorization is terminated  or revoked sooner.       Influenza A by PCR NEGATIVE NEGATIVE Final   Influenza B by PCR NEGATIVE NEGATIVE Final    Comment: (NOTE) The Xpert Xpress SARS-CoV-2/FLU/RSV plus assay is intended as an aid in the diagnosis of influenza from Nasopharyngeal swab specimens and should not be used as a sole basis for treatment. Nasal washings and aspirates are unacceptable for Xpert Xpress SARS-CoV-2/FLU/RSV testing.  Fact Sheet for Patients: BloggerCourse.com  Fact Sheet for Healthcare Providers: SeriousBroker.it  This test is not yet approved or cleared by the [place] FDA and has been  authorized for detection and/or diagnosis of SARS-CoV-2 by FDA under an Emergency Use Authorization (EUA). This EUA will remain in effect (meaning this test can be used) for the duration of the COVID-19 declaration under Section 564(b)(1) of the Act, 21 U.S.C. section 360bbb-3(b)(1), unless the authorization is terminated or revoked.  Performed at [facility], [address] [place], [place] [address]      Radiological Exams on Admission: DG Chest Portable 1 View  Result Date: 05/06/2021 CLINICAL DATA:  Shortness of breath. EXAM: PORTABLE CHEST 1 VIEW COMPARISON:  April 11, 2021 FINDINGS: Mild, diffusely increased interstitial lung markings are noted. Mild to moderate severity areas of atelectasis and/or infiltrate are seen within the bilateral lung bases. There is no evidence of a pleural effusion or pneumothorax. The cardiac silhouette is borderline in size and unchanged in appearance. The visualized skeletal structures are unremarkable. IMPRESSION: Mild interstitial edema with mild to moderate severity bibasilar atelectasis and/or infiltrate. Electronically Signed   By: Aram Candela M.D.   On: 05/06/2021 02:44    EKG: Independently reviewed.  Sinus tachycardia with LVH and early repolarization changes.  Assessment/Plan Principal Problem:   Acute respiratory failure with hypoxia (HCC) Active Problems:   Hypertensive emergency   Acute pulmonary edema (HCC)    Acute respiratory failure with hypoxia on BiPAP secondary to acute pulmonary edema in the setting of hypertensive emergency -appreciate cardiology consult.  Patient was given Lasix 40 mg IV blood pressure improved without starting nitroglycerin.  Denies any chest pain.  We will try to wean off BiPAP.  Trend cardiac markers check 2D echo.  Based on the response to Lasix we will decide further dose of Lasix.  Follow intake output Daily weights and metabolic panel. Hypertensive emergency patient had stopped taking all  his antihypertensives and diuretics.  Will restart ARB keep patient on as needed IV hydralazine.  If blood pressure does increase again may need nitroglycerin infusion. Cocaine alcohol and tobacco abuse advised about quitting.  We will keep patient on CIWA. History of hyperlipidemia used to be on statins.  Check lipid panel. Mild hypokalemia replace and recheck. Hyperglycemia hemoglobin A1c is 6.4 patient is prediabetic will need nutrition counseling.   DVT prophylaxis: Lovenox. Code Status: Full code. Family Communication: Discussed with patient. Disposition Plan: Home. Consults called: Cardiology. Admission status: Observation.   Eduard Clos MD [facility] Pager [phone].  If 7PM-7AM, please contact night-coverage [email or web address] Password [facility]  05/06/2021, 5:38 AM

## 2021-05-06 NOTE — ED Notes (Signed)
Echo at the bedside °

## 2021-05-06 NOTE — Progress Notes (Addendum)
PROGRESS NOTE    Joseph Reese  OBS:[id number] DOB: 06/16/1965 DOA: 05/06/2021 PCP: Oneita Hurt, No   Brief Narrative:  Joseph Reese is a 55 y.o. male with history of hypertension, hyperlipidemia, CHF presents to the ER with sudden onset of shortness of breath when he was sleeping which woke him up from the sleep.  Denies any chest pain.  Patient states last few days he has been mildly short of breath.  Patient states he has history of hypertension which was diagnosed in 2019 and was prescribed medications which he has not taken for the last 3 months because he was not able to afford.  Admits to taking cocaine about 3 days ago drinks alcohol every day and smokes cigarettes.   ED Course: In the ER patient was found to have blood pressure systolic more than 200 diastolic more than 110.  Cardiology on-call was consulted.  EKG shows sinus tachycardia chest x-ray shows congestion.  Cardiology recommended admitting for acute pulmonary edema Lasix 40 mg IV was given but blood pressure improved before nitroglycerin could be started.  BNP is 571 isolated troponin 32 hemoglobin A1c is 6.3.  COVID test negative.  Patient admitted for acute pulmonary edema with hypertensive emergency.  Assessment & Plan:  Acute hypoxemic respiratory failure in the setting of flash pulmonary edema and hypertensive emergency: -Initially requiring BiPAP however currently on 4 L nasal cannula. -Chest x-ray suggestive of flash pulmonary edema with severely elevated blood pressure -Trend troponin.-58--> 32--> 54--> 65.  BNP: 571.  TSH: WNL - serial EKG.  Transthoracic echo shows severe LV dysfunction with ejection fraction of 15 to 20%, global hypokinesis with akinesis of the mid to basal inferior septal wall, mildly reduced RV contraction as well.-Lasix 40 mg IV  daily -Appreciate cardiology recommendations-plan is right and left heart catheterization  -Strict INO's and daily weight.  Monitor electrolytes closely and kidney  function  Hypertensive emergency: Secondary to noncompliance with medications. -Blood pressure is improving. -Continue ARB and IV hydralazine as needed. -Monitor closely.  Substance abuse disorder: -History of cocaine abuse, tobacco abuse and alcohol abuse -Urine drug is positive for cocaine. -On CIWA protocol.  Continue multivitamin, thiamine and folic acid -Recommend cessation.  Hypokalemia: Replenished.  Repeat BMP tomorrow a.m. magnesium: WNL  Prediabetes: A1c 6.4% -Monitor blood sugar closely.  Hyperlipidemia: Elevated total cholesterol and LDL -Start atorvastatin 80 mg once daily. Obesity with BMI of 31: Healthy lifestyle modifications recommended   DVT prophylaxis: Lovenox Code Status: Full code Family Communication: None present at bedside.  Plan of care discussed with patient in length and he verbalized understanding and agreed with it. Disposition Plan: to be determined  Consultants:  cardilogy  Procedures:  none  Antimicrobials:  none   Status is: Observation    Subjective: Patient seen and examined.  Sitting comfortably on the bed.  On nasal cannula.  Tells me that his breathing is better right now.  Denies chest pain, palpitations, leg swelling, lightheadedness, dizziness, fever, chills, nausea, vomiting.  His last dose of cocaine was 2 days ago.  Objective: Vitals:   05/06/21 1015 05/06/21 1030 05/06/21 1045 05/06/21 1100  BP:  (!) 145/118  (!) 126/92  Pulse: 97 89 91 82  Resp: (!) 22 15 20  (!) 32  Temp:      TempSrc:      SpO2: 99% 98% 98% 98%  Weight:      Height:        Intake/Output Summary (Last 24 hours) at 05/06/2021 1146 Last data filed at  05/06/2021 0725 Gross per 24 hour  Intake --  Output 1000 ml  Net -1000 ml   Filed Weights   05/06/21 0306  Weight: 98 kg    Examination:  General exam: Appears calm and comfortable, on [place], sitting comfortably but appears tired Respiratory system: decrease BS on the bases, no wheezing or  crackles Cardiovascular system: S1 & S2 heard, RRR. No JVD, murmurs, rubs, gallops or clicks. No pedal edema. Gastrointestinal system: Abdomen is nondistended, soft and nontender. No organomegaly or masses felt. Normal bowel sounds heard. Central nervous system: Alert and oriented. No focal neurological deficits. Extremities: Symmetric 5 x 5 power. Skin: No rashes, lesions or ulcers Psychiatry: Judgement and insight appear normal. Mood & affect appropriate.    Data Reviewed: I have personally reviewed following labs and imaging studies  CBC: Recent Labs  Lab 05/06/21 0226 05/06/21 0249 05/06/21 0645  WBC 6.9  --  7.4  NEUTROABS 2.9  --   --   HGB 14.0 14.6 14.7  HCT 44.7 43.0 45.4  MCV 85.6  --  84.1  PLT 266  --  267   Basic Metabolic Panel: Recent Labs  Lab 05/06/21 0233 05/06/21 0249 05/06/21 0535 05/06/21 0645  NA 136 141 138  --   K 3.3* 3.2* 4.2  --   CL 106  --  107  --   CO2 19*  --  23  --   GLUCOSE 294*  --  97  --   BUN 11  --  12  --   CREATININE 1.23  --  1.00 1.05  CALCIUM 8.5*  --  8.8*  --   MG  --   --   --  2.1   GFR: Estimated Creatinine Clearance: 89.6 mL/min (by C-G formula based on SCr of 1.05 mg/dL). Liver Function Tests: Recent Labs  Lab 05/06/21 0233  AST 41  ALT 32  ALKPHOS 74  BILITOT 0.6  PROT 7.4  ALBUMIN 3.2*   No results for input(s): LIPASE, AMYLASE in the last 168 hours. No results for input(s): AMMONIA in the last 168 hours. Coagulation Profile: Recent Labs  Lab 05/06/21 0226  INR 1.0   Cardiac Enzymes: No results for input(s): CKTOTAL, CKMB, CKMBINDEX, TROPONINI in the last 168 hours. BNP (last 3 results) No results for input(s): PROBNP in the last 8760 hours. HbA1C: Recent Labs    05/06/21 0233  HGBA1C 6.4*   CBG: No results for input(s): GLUCAP in the last 168 hours. Lipid Profile: Recent Labs    05/06/21 0233  CHOL 296*  HDL 55  LDLCALC 219*  TRIG 111  CHOLHDL 5.4   Thyroid Function  Tests: Recent Labs    05/06/21 0645  TSH 0.746   Anemia Panel: No results for input(s): VITAMINB12, FOLATE, FERRITIN, TIBC, IRON, RETICCTPCT in the last 72 hours. Sepsis Labs: No results for input(s): PROCALCITON, LATICACIDVEN in the last 168 hours.  Recent Results (from the past 240 hour(s))  Resp Panel by RT-PCR (Flu A&B, Covid) Nasopharyngeal Swab     Status: None   Collection Time: 05/06/21  2:27 AM   Specimen: Nasopharyngeal Swab; Nasopharyngeal(NP) swabs in vial transport medium  Result Value Ref Range Status   SARS Coronavirus 2 by RT PCR NEGATIVE NEGATIVE Final    Comment: (NOTE) SARS-CoV-2 target nucleic acids are NOT DETECTED.  The SARS-CoV-2 RNA is generally detectable in upper respiratory specimens during the acute phase of infection. The lowest concentration of SARS-CoV-2 viral copies this assay can detect is  138 copies/mL. A negative result does not preclude SARS-Cov-2 infection and should not be used as the sole basis for treatment or other patient management decisions. A negative result may occur with  improper specimen collection/handling, submission of specimen other than nasopharyngeal swab, presence of viral mutation(s) within the areas targeted by this assay, and inadequate number of viral copies(<138 copies/mL). A negative result must be combined with clinical observations, patient history, and epidemiological information. The expected result is Negative.  Fact Sheet for Patients:  BloggerCourse.com  Fact Sheet for Healthcare Providers:  SeriousBroker.it  This test is no t yet approved or cleared by the [place] FDA and  has been authorized for detection and/or diagnosis of SARS-CoV-2 by FDA under an Emergency Use Authorization (EUA). This EUA will remain  in effect (meaning this test can be used) for the duration of the COVID-19 declaration under Section 564(b)(1) of the Act, 21 U.S.C.section  360bbb-3(b)(1), unless the authorization is terminated  or revoked sooner.       Influenza A by PCR NEGATIVE NEGATIVE Final   Influenza B by PCR NEGATIVE NEGATIVE Final    Comment: (NOTE) The Xpert Xpress SARS-CoV-2/FLU/RSV plus assay is intended as an aid in the diagnosis of influenza from Nasopharyngeal swab specimens and should not be used as a sole basis for treatment. Nasal washings and aspirates are unacceptable for Xpert Xpress SARS-CoV-2/FLU/RSV testing.  Fact Sheet for Patients: BloggerCourse.com  Fact Sheet for Healthcare Providers: SeriousBroker.it  This test is not yet approved or cleared by the [place] FDA and has been authorized for detection and/or diagnosis of SARS-CoV-2 by FDA under an Emergency Use Authorization (EUA). This EUA will remain in effect (meaning this test can be used) for the duration of the COVID-19 declaration under Section 564(b)(1) of the Act, 21 U.S.C. section 360bbb-3(b)(1), unless the authorization is terminated or revoked.  Performed at [facility], [address], [place], [place] [address]       Radiology Studies: DG Chest Portable 1 View  Result Date: 05/06/2021 CLINICAL DATA:  Shortness of breath. EXAM: PORTABLE CHEST 1 VIEW COMPARISON:  April 11, 2021 FINDINGS: Mild, diffusely increased interstitial lung markings are noted. Mild to moderate severity areas of atelectasis and/or infiltrate are seen within the bilateral lung bases. There is no evidence of a pleural effusion or pneumothorax. The cardiac silhouette is borderline in size and unchanged in appearance. The visualized skeletal structures are unremarkable. IMPRESSION: Mild interstitial edema with mild to moderate severity bibasilar atelectasis and/or infiltrate. Electronically Signed   By: Aram Candela M.D.   On: 05/06/2021 02:44    Scheduled Meds:  enoxaparin (LOVENOX) injection  50 mg Subcutaneous Q24H    folic acid  1 mg Oral Daily   losartan  50 mg Oral Daily   multivitamin with minerals  1 tablet Oral Daily   thiamine  100 mg Oral Daily   Or   thiamine  100 mg Intravenous Daily   Continuous Infusions:   LOS: 0 days   Time spent: 35 minutes   Darcell Yacoub Estill Cotta, MD [facility]  If 7PM-7AM, please contact night-coverage [email or web address] 05/06/2021, 11:46 AM

## 2021-05-06 NOTE — Progress Notes (Signed)
Pt stated he has an 8/10 headache. Notified provider

## 2021-05-06 NOTE — Consult Note (Signed)
[facility] Consult Note   Primary Physician: NONE Primary Cardiologist:  NONE  Reason for Consultation: Acute onset shortness of breath  HPI:    Joseph Reese is a 55 year old gentleman with a past medical history of ? Cardiomyopathy, essential hypertension, obesity, tobacco abuse, and hyperlipidemia, not taking any medications, who was admitted with acute onset of shortness of breath.  In the ED initial vital signs were: Blood pressure 192/133 mmHg, heart rate 129 bpm, respiratory rate 38.  EMS were called to his house for shortness of breath.  The patient reported being unable to breathe in the evening.  Initial sats were in the 70s.  He was placed on CPAP and given 125 mg of Solu-Medrol en route. In the hospital, the ECG from 05/06/2021 02:23, that I reviewed personally, showed sinus tachycardia, rate 129 bpm, LVH and repolarization abnormality.  Initially code STEMI was called but later canceled.  For BP of 210/140 mmHg, patient placed on IV nitroglycerin infusion.  Also given 324 mg of aspirin.  Chest x-ray showing mild interstitial edema with mild to moderate severity bibasilar atelectasis and/or infiltrate   Home Medications Prior to Admission medications   Medication Sig Start Date End Date Taking? Authorizing Provider  amoxicillin-clavulanate (AUGMENTIN) 875-125 MG tablet Take 1 tablet by mouth every 12 (twelve) hours. 04/12/21   Lorre Nick, MD  HYDROcodone-acetaminophen (HYCET) 7.5-325 mg/15 ml solution Take 15 mLs by mouth every 6 (six) hours as needed for moderate pain. 04/12/21   Lorre Nick, MD    Past Medical History: Past Medical History:  Diagnosis Date   Diabetes mellitus without complication (HCC)    Hypertension     Past Surgical History: History reviewed. No pertinent surgical history.  Family History: History reviewed. No pertinent family history.  Social History: Social History   Socioeconomic History   Marital status: Divorced     Spouse name: Not on file   Number of children: Not on file   Years of education: Not on file   Highest education level: Not on file  Occupational History   Not on file  Tobacco Use   Smoking status: Not on file   Smokeless tobacco: Not on file  Substance and Sexual Activity   Alcohol use: Not on file   Drug use: Not on file   Sexual activity: Not on file  Other Topics Concern   Not on file  Social History Narrative   Not on file   Social Determinants of Health   Financial Resource Strain: Not on file  Food Insecurity: Not on file  Transportation Needs: Not on file  Physical Activity: Not on file  Stress: Not on file  Social Connections: Not on file    Allergies:  No Known Allergies   Review of Systems: [y] = yes, [ ]  = no   General: Weight gain [ ] ; Weight loss [ ] ; Anorexia [ ] ; Fatigue [ ] ; Fever [ ] ; Chills [ ] ; Weakness [ ]   Cardiac: Chest pain/pressure [Y]; Resting SOB [Y]; Exertional SOB [Y]; Orthopnea [ ] ; Pedal Edema [ ] ; Palpitations [ ] ; Syncope [ ] ; Presyncope [ ] ; Paroxysmal nocturnal dyspnea[ ]   Pulmonary: Cough [ ] ; Wheezing[ ] ; Hemoptysis[ ] ; Sputum [ ] ; Snoring [ ]   GI: Vomiting[ ] ; Dysphagia[ ] ; Melena[ ] ; Hematochezia [ ] ; Heartburn[ ] ; Abdominal pain [ ] ; Constipation [ ] ; Diarrhea [ ] ; BRBPR [ ]   GU: Hematuria[ ] ; Dysuria [ ] ; Nocturia[ ]   Vascular: Pain in legs with walking [ ] ; Pain  in feet with lying flat [ ] ; Non-healing sores [ ] ; Stroke [ ] ; TIA [ ] ; Slurred speech [ ] ;  Neuro: Headaches[ ] ; Vertigo[ ] ; Seizures[ ] ; Paresthesias[ ] ;Blurred vision [ ] ; Diplopia [ ] ; Vision changes [ ]   Ortho/Skin: Arthritis [ ] ; Joint pain [ ] ; Muscle pain [ ] ; Joint swelling [ ] ; Back Pain [ ] ; Rash [ ]   Psych: Depression[ ] ; Anxiety[ ]   Heme: Bleeding problems [ ] ; Clotting disorders [ ] ; Anemia [ ]   Endocrine: Diabetes [ ] ; Thyroid dysfunction[ ]      Objective:    Vital Signs:   Pulse Rate:  [105-130] 105 (12/24 0245) Resp:  [21-38] 21 (12/24 0245) BP:  (146-210)/(98-140) 146/98 (12/24 0245) SpO2:  [98 %-100 %] 98 % (12/24 0245) FiO2 (%):  [60 %] 60 % (12/24 0225)    Weight change: There were no vitals filed for this visit.  Intake/Output:  No intake or output data in the 24 hours ending 05/06/21 0256    Physical Exam    General: Significantly tachypneic, appears in distress, obese, diaphoretic HEENT: normal Neck: supple. Carotids 2+ bilat; no bruits. No lymphadenopathy or thyromegaly appreciated. Cor: Tachycardic Lungs: Diminished air movement bilaterally, + use of accessory muscles Abdomen: soft, nontender, nondistended. No hepatosplenomegaly. No bruits or masses.  Extremities: no cyanosis, clubbing, rash, trace edema Neuro: alert & orientedx3,  Affect: agitated, anxious    EKG    the ECG from 05/06/2021 02:23, that I reviewed personally, showed sinus tachycardia, rate 129 bpm, LVH and repolarization abnormality.   Labs   Basic Metabolic Panel: Recent Labs  Lab 05/06/21 0249  NA 141  K 3.2*    Liver Function Tests: No results for input(s): AST, ALT, ALKPHOS, BILITOT, PROT, ALBUMIN in the last 168 hours. No results for input(s): LIPASE, AMYLASE in the last 168 hours. No results for input(s): AMMONIA in the last 168 hours.  CBC: Recent Labs  Lab 05/06/21 0226 05/06/21 0249  WBC 6.9  --   NEUTROABS 2.9  --   HGB 14.0 14.6  HCT 44.7 43.0  MCV 85.6  --   PLT 266  --     Cardiac Enzymes: No results for input(s): CKTOTAL, CKMB, CKMBINDEX, TROPONINI in the last 168 hours.  BNP: BNP (last 3 results) No results for input(s): BNP in the last 8760 hours.  ProBNP (last 3 results) No results for input(s): PROBNP in the last 8760 hours.   CBG: No results for input(s): GLUCAP in the last 168 hours.  Coagulation Studies: No results for input(s): LABPROT, INR in the last 72 hours.   Imaging   DG Chest Portable 1 View  Result Date: 05/06/2021 CLINICAL DATA:  Shortness of breath. EXAM: PORTABLE CHEST 1  VIEW COMPARISON:  April 11, 2021 FINDINGS: Mild, diffusely increased interstitial lung markings are noted. Mild to moderate severity areas of atelectasis and/or infiltrate are seen within the bilateral lung bases. There is no evidence of a pleural effusion or pneumothorax. The cardiac silhouette is borderline in size and unchanged in appearance. The visualized skeletal structures are unremarkable. IMPRESSION: Mild interstitial edema with mild to moderate severity bibasilar atelectasis and/or infiltrate. Electronically Signed   By: Virgina Norfolk M.D.   On: 05/06/2021 02:44     Medications:     Current Medications:  aspirin  324 mg Oral Once   heparin  4,000 Units Intravenous Once   LORazepam  0.5 mg Intravenous Once    Infusions:  sodium chloride     nitroGLYCERIN  Assessment/Plan   Hypertensive emergency with flash pulmonary edema  Patient has a history of hypertension but is currently not on any medical therapy.  He reports acute onset of shortness of breath this evening.  Clinical exam and chest x-ray are suggestive of flash pulmonary edema with severely elevated blood pressure. Clinic note from 05/25/2019 (scanned in Care Everywhere) seems to indicate that he has an EF of 25-30% on echo.  -Infusion of IV vasodilator (e.g: nitroglycerin) -Monitor I and Os -IV lasix bolus -Trend cardiac enzymes -Serial ECGs -Check BNP -Correct electrolyte abnormalities (maintain K >4.0 and Mg >2.0) -Transthoracic echocardiogram  If initial troponin elevated then would consider IV heparin   Meade Maw, MD  05/06/2021, 2:56 AM  Cardiology Overnight Team Please contact [facility] for night-coverage after hours (4p -7a ) and weekends on [email or web address]

## 2021-05-06 NOTE — Progress Notes (Signed)
°  Echocardiogram 2D Echocardiogram has been performed.  Joseph Reese 05/06/2021, 9:11 AM

## 2021-05-07 LAB — BASIC METABOLIC PANEL
Anion gap: 8 (ref 5–15)
BUN: 13 mg/dL (ref 6–20)
CO2: 26 mmol/L (ref 22–32)
Calcium: 9 mg/dL (ref 8.9–10.3)
Chloride: 105 mmol/L (ref 98–111)
Creatinine, Ser: 0.96 mg/dL (ref 0.61–1.24)
GFR, Estimated: >60 mL/min (ref >60)
Glucose, Bld: 115 mg/dL — ABNORMAL HIGH (ref 70–99)
Potassium: 3.9 mmol/L (ref 3.5–5.1)
Sodium: 139 mmol/L (ref 135–145)

## 2021-05-07 LAB — MAGNESIUM: Magnesium: 1.9 mg/dL (ref 1.7–2.4)

## 2021-05-07 MED ORDER — POTASSIUM CHLORIDE 20 MEQ PO PACK
40.0000 meq | PACK | Freq: Once | ORAL | Status: AC
  Administered 2021-05-07: 16:00:00 40 meq via ORAL
  Filled 2021-05-07: qty 2

## 2021-05-07 MED ORDER — MAGNESIUM SULFATE 2 GM/50ML IV SOLN
2.0000 g | Freq: Once | INTRAVENOUS | Status: AC
  Administered 2021-05-07: 09:00:00 2 g via INTRAVENOUS
  Filled 2021-05-07: qty 50

## 2021-05-07 MED ORDER — CARVEDILOL 6.25 MG PO TABS
6.2500 mg | ORAL_TABLET | Freq: Two times a day (BID) | ORAL | Status: DC
  Administered 2021-05-07 – 2021-05-09 (×3): 6.25 mg via ORAL
  Filled 2021-05-07 (×4): qty 1

## 2021-05-07 NOTE — Progress Notes (Signed)
PROGRESS NOTE    Joseph Reese  XBM:[id number] DOB: June 19, 1965 DOA: 05/06/2021 PCP: Oneita Hurt, No   Brief Narrative:  Joseph Reese is a 55 y.o. male with history of hypertension, hyperlipidemia, CHF presents to the ER with sudden onset of shortness of breath when he was sleeping which woke him up from the sleep.  Denies any chest pain.  Patient states last few days he has been mildly short of breath.  Patient states he has history of hypertension which was diagnosed in 2019 and was prescribed medications which he has not taken for the last 3 months because he was not able to afford.  Admits to taking cocaine about 3 days ago drinks alcohol every day and smokes cigarettes.   ED Course: In the ER patient was found to have blood pressure systolic more than 200 diastolic more than 110.  Cardiology on-call was consulted.  EKG shows sinus tachycardia chest x-ray shows congestion.  Cardiology recommended admitting for acute pulmonary edema Lasix 40 mg IV was given but blood pressure improved before nitroglycerin could be started.  BNP is 571 isolated troponin 32 hemoglobin A1c is 6.3.  COVID test negative.  Patient admitted for acute pulmonary edema with hypertensive emergency.  Assessment & Plan:  Acute hypoxemic respiratory failure in the setting of flash pulmonary edema and hypertensive emergency: Improving -Initially requiring BiPAP, now on room air -Chest x-ray suggestive of flash pulmonary edema with severely elevated blood pressure -troponin.-58--> 32--> 54--> 65.  BNP: 571.  TSH: WNL -Transthoracic echo shows severe LV dysfunction with ejection fraction of 15 to 20%, global hypokinesis with akinesis of the mid to basal inferior septal wall, mildly reduced RV contraction as well. -Lasix 40 mg IV  daily-transition to p.o. tomorrow.  Added Coreg by cardiology -Appreciate cardiology recommendations-plan is left heart catheterization  -Strict INO's and daily weight.  Monitor electrolytes closely and kidney  function  Hypertensive emergency: Improving -Secondary to noncompliance with medications. -Blood pressure is morning -Continue ARB, Coreg and IV hydralazine as needed. -Monitor closely.  Substance abuse disorder: -History of cocaine abuse, tobacco abuse and alcohol abuse -Urine drug is positive for cocaine. -On CIWA protocol.  Continue multivitamin, thiamine and folic acid -Recommend cessation.  Hypokalemia/hypomagnesemia: Replenished.   -Repeat BMP and magnesium level tomorrow a.m.  Prediabetes: A1c 6.4% -Monitor blood sugar closely.  Hyperlipidemia: Elevated total cholesterol and LDL -Start atorvastatin 80 mg once daily.  Obesity with BMI of 31: Healthy lifestyle modifications recommended   DVT prophylaxis: Lovenox Code Status: Full code Family Communication: None present at bedside.  Plan of care discussed with patient in length and he verbalized understanding and agreed with it. Disposition Plan: to be determined  Consultants:  cardilogy  Procedures:  none  Antimicrobials:  none   Status is: Observation    Subjective: Patient seen and examined.  Sitting comfortably on the bed.  On nasal cannula.  Tells me that patient seen and examined.  Resting comfortably on the bed.  Tells me that he is doing much better this morning and wishes to go home.  Denies chest pain, shortness of breath, leg swelling, orthopnea or PND  Objective: Vitals:   05/06/21 1522 05/06/21 2026 05/07/21 0535 05/07/21 0919  BP: (!) 142/102 127/90 (!) 135/102 (!) 136/92  Pulse: 97 93 (!) 103 (!) 102  Resp: Temp: 98.9 F (37.2 C) 98.9 F (37.2 C) 98.1 F (36.7 C)   TempSrc: Oral Oral Oral   SpO2: 100% 100% 98% 97%  Weight: 101.3  kg  98.3 kg   Height: 5\' 9"  (1.753 m)       Intake/Output Summary (Last 24 hours) at 05/07/2021 1350 Last data filed at 05/07/2021 1310 Gross per 24 hour  Intake 290 ml  Output 2700 ml  Net -2410 ml    Filed Weights   05/06/21 0306 05/06/21  1522 05/07/21 0535  Weight: 98 kg 101.3 kg 98.3 kg    Examination:  General exam: Appears calm and comfortable, on [place], sitting comfortably but appears tired Respiratory system: Clear to auscultation.  No wheezing or rhonchi.   Cardiovascular system: S1 & S2 heard, RRR. No JVD, murmurs, rubs, gallops or clicks. No pedal edema. Gastrointestinal system: Abdomen is nondistended, soft and nontender. No organomegaly or masses felt. Normal bowel sounds heard. Central nervous system: Alert and oriented. No focal neurological deficits. Extremities: Symmetric 5 x 5 power. Skin: No rashes, lesions or ulcers Psychiatry: Judgement and insight appear normal. Mood & affect appropriate.    Data Reviewed: I have personally reviewed following labs and imaging studies  CBC: Recent Labs  Lab 05/06/21 0226 05/06/21 0249 05/06/21 0645  WBC 6.9  --  7.4  NEUTROABS 2.9  --   --   HGB 14.0 14.6 14.7  HCT 44.7 43.0 45.4  MCV 85.6  --  84.1  PLT 266  --  267    Basic Metabolic Panel: Recent Labs  Lab 05/06/21 0233 05/06/21 0249 05/06/21 0535 05/06/21 0645 05/07/21 0334  NA 136 141 138  --  139  K 3.3* 3.2* 4.2  --  3.9  CL 106  --  107  --  105  CO2 19*  --  23  --  26  GLUCOSE 294*  --  97  --  115*  BUN 11  --  12  --  13  CREATININE 1.23  --  1.00 1.05 0.96  CALCIUM 8.5*  --  8.8*  --  9.0  MG  --   --   --  2.1 1.9    GFR: Estimated Creatinine Clearance: 98.1 mL/min (by C-G formula based on SCr of 0.96 mg/dL). Liver Function Tests: Recent Labs  Lab 05/06/21 0233  AST 41  ALT 32  ALKPHOS 74  BILITOT 0.6  PROT 7.4  ALBUMIN 3.2*    No results for input(s): LIPASE, AMYLASE in the last 168 hours. No results for input(s): AMMONIA in the last 168 hours. Coagulation Profile: Recent Labs  Lab 05/06/21 0226  INR 1.0    Cardiac Enzymes: No results for input(s): CKTOTAL, CKMB, CKMBINDEX, TROPONINI in the last 168 hours. BNP (last 3 results) No results for input(s): PROBNP in  the last 8760 hours. HbA1C: Recent Labs    05/06/21 0233  HGBA1C 6.4*    CBG: No results for input(s): GLUCAP in the last 168 hours. Lipid Profile: Recent Labs    05/06/21 0233  CHOL 296*  HDL 55  LDLCALC 219*  TRIG 111  CHOLHDL 5.4    Thyroid Function Tests: Recent Labs    05/06/21 0645  TSH 0.746    Anemia Panel: No results for input(s): VITAMINB12, FOLATE, FERRITIN, TIBC, IRON, RETICCTPCT in the last 72 hours. Sepsis Labs: No results for input(s): PROCALCITON, LATICACIDVEN in the last 168 hours.  Recent Results (from the past 240 hour(s))  Resp Panel by RT-PCR (Flu A&B, Covid) Nasopharyngeal Swab     Status: None   Collection Time: 05/06/21  2:27 AM   Specimen: Nasopharyngeal Swab; Nasopharyngeal(NP) swabs in vial transport medium  Result Value Ref Range Status   SARS Coronavirus 2 by RT PCR NEGATIVE NEGATIVE Final    Comment: (NOTE) SARS-CoV-2 target nucleic acids are NOT DETECTED.  The SARS-CoV-2 RNA is generally detectable in upper respiratory specimens during the acute phase of infection. The lowest concentration of SARS-CoV-2 viral copies this assay can detect is 138 copies/mL. A negative result does not preclude SARS-Cov-2 infection and should not be used as the sole basis for treatment or other patient management decisions. A negative result may occur with  improper specimen collection/handling, submission of specimen other than nasopharyngeal swab, presence of viral mutation(s) within the areas targeted by this assay, and inadequate number of viral copies(<138 copies/mL). A negative result must be combined with clinical observations, patient history, and epidemiological information. The expected result is Negative.  Fact Sheet for Patients:  BloggerCourse.com  Fact Sheet for Healthcare Providers:  SeriousBroker.it  This test is no t yet approved or cleared by the [place] FDA and  has been  authorized for detection and/or diagnosis of SARS-CoV-2 by FDA under an Emergency Use Authorization (EUA). This EUA will remain  in effect (meaning this test can be used) for the duration of the COVID-19 declaration under Section 564(b)(1) of the Act, 21 U.S.C.section 360bbb-3(b)(1), unless the authorization is terminated  or revoked sooner.       Influenza A by PCR NEGATIVE NEGATIVE Final   Influenza B by PCR NEGATIVE NEGATIVE Final    Comment: (NOTE) The Xpert Xpress SARS-CoV-2/FLU/RSV plus assay is intended as an aid in the diagnosis of influenza from Nasopharyngeal swab specimens and should not be used as a sole basis for treatment. Nasal washings and aspirates are unacceptable for Xpert Xpress SARS-CoV-2/FLU/RSV testing.  Fact Sheet for Patients: BloggerCourse.com  Fact Sheet for Healthcare Providers: SeriousBroker.it  This test is not yet approved or cleared by the [place] FDA and has been authorized for detection and/or diagnosis of SARS-CoV-2 by FDA under an Emergency Use Authorization (EUA). This EUA will remain in effect (meaning this test can be used) for the duration of the COVID-19 declaration under Section 564(b)(1) of the Act, 21 U.S.C. section 360bbb-3(b)(1), unless the authorization is terminated or revoked.  Performed at [facility], [address] [place], [place] [address]        Radiology Studies: DG Chest Portable 1 View  Result Date: 05/06/2021 CLINICAL DATA:  Shortness of breath. EXAM: PORTABLE CHEST 1 VIEW COMPARISON:  April 11, 2021 FINDINGS: Mild, diffusely increased interstitial lung markings are noted. Mild to moderate severity areas of atelectasis and/or infiltrate are seen within the bilateral lung bases. There is no evidence of a pleural effusion or pneumothorax. The cardiac silhouette is borderline in size and unchanged in appearance. The visualized skeletal structures are  unremarkable. IMPRESSION: Mild interstitial edema with mild to moderate severity bibasilar atelectasis and/or infiltrate. Electronically Signed   By: Aram Candela M.D.   On: 05/06/2021 02:44   ECHOCARDIOGRAM COMPLETE  Result Date: 05/06/2021    ECHOCARDIOGRAM REPORT   Patient Name:   MECCA BARGA Date of Exam: 05/06/2021 Medical Rec #:  [id number]   Height:       69.0 in Accession #:    [id number]  Weight:       216.0 lb Date of Birth:  May 07, 1966    BSA:          2.135 m Patient Age:    55 years    BP:  134/100 mmHg Patient Gender: M           HR:           84 bpm. Exam Location:  Inpatient Procedure: 2D Echo, Cardiac Doppler and Color Doppler Indications:    CHF  History:        Patient has no prior history of Echocardiogram examinations.                 CHF, Signs/Symptoms:Shortness of Breath; Risk Factors:Diabetes,                 Hypertension, Dyslipidemia and Current Smoker. Cocaine use.  Sonographer:    Ross Ludwig RDCS (AE) Referring Phys: [id number] [address]eryle Ready [facility] IMPRESSIONS  1. Left ventricular ejection fraction, by estimation, is 15-20%. The left ventricle has severely decreased function. The left ventricle demonstrates regional wall motion abnormalities (see scoring diagram/findings for description). There is moderate left ventricular hypertrophy. Left ventricular diastolic parameters were normal.  2. Right ventricular systolic function is mildly reduced. The right ventricular size is normal. Tricuspid regurgitation signal is inadequate for assessing PA pressure.  3. Left atrial size was mild to moderately dilated.  4. The mitral valve is grossly normal. Mild mitral valve regurgitation.  5. The aortic valve is tricuspid. There is mild calcification of the aortic valve. Aortic valve regurgitation is mild. No aortic stenosis is present. Aortic valve mean gradient measures 3.0 mmHg.  6. The inferior vena cava is normal in size with greater than 50% respiratory variability, suggesting right  atrial pressure of 3 mmHg. Comparison(s): No prior Echocardiogram. FINDINGS  Left Ventricle: Left ventricular ejection fraction, by estimation, is 15-20%. The left ventricle has severely decreased function. The left ventricle demonstrates regional wall motion abnormalities. The left ventricular internal cavity size was normal in  size. There is moderate left ventricular hypertrophy. Left ventricular diastolic parameters were normal.  LV Wall Scoring: The inferior wall, mid inferolateral segment, mid inferoseptal segment, and basal inferoseptal segment are akinetic. The entire anterior wall, antero-lateral wall, entire anterior septum, entire apex, and basal inferolateral segment are hypokinetic. Right Ventricle: The right ventricular size is normal. No increase in right ventricular wall thickness. Right ventricular systolic function is mildly reduced. Tricuspid regurgitation signal is inadequate for assessing PA pressure. Left Atrium: Left atrial size was mild to moderately dilated. Right Atrium: Right atrial size was normal in size. Pericardium: There is no evidence of pericardial effusion. Mitral Valve: The mitral valve is grossly normal. There is mild thickening of the mitral valve leaflet(s). Mild mitral valve regurgitation. MV peak gradient, 3.0 mmHg. The mean mitral valve gradient is 2.0 mmHg. Tricuspid Valve: The tricuspid valve is grossly normal. Tricuspid valve regurgitation is trivial. Aortic Valve: The aortic valve is tricuspid. There is mild calcification of the aortic valve. There is mild aortic valve annular calcification. Aortic valve regurgitation is mild. No aortic stenosis is present. Aortic valve mean gradient measures 3.0 mmHg. Aortic valve peak gradient measures 5.5 mmHg. Aortic valve area, by VTI measures 4.18 cm. Pulmonic Valve: The pulmonic valve was grossly normal. Pulmonic valve regurgitation is trivial. Aorta: The aortic root is normal in size and structure. Venous: The inferior vena cava  is normal in size with greater than 50% respiratory variability, suggesting right atrial pressure of 3 mmHg. IAS/Shunts: No atrial level shunt detected by color flow Doppler.  LEFT VENTRICLE PLAX 2D LVIDd:         5.60 cm   Diastology LVIDs:  5.10 cm   LV e' medial:  5.77 cm/s LV PW:         2.00 cm   LV e' lateral: 7.15 cm/s LV IVS:        1.60 cm LVOT diam:     2.60 cm LV SV:         75 LV SV Index:   35 LVOT Area:     5.31 cm  RIGHT VENTRICLE            IVC RV Basal diam:  3.50 cm    IVC diam: 1.90 cm RV S prime:     8.73 cm/s TAPSE (M-mode): 1.7 cm LEFT ATRIUM             Index        RIGHT ATRIUM           Index LA diam:        5.40 cm 2.53 cm/m   RA Area:     11.10 cm LA Vol (A2C):   99.0 ml 46.37 ml/m  RA Volume:   23.30 ml  10.91 ml/m LA Vol (A4C):   91.3 ml 42.77 ml/m LA Biplane Vol: 98.5 ml 46.14 ml/m  AORTIC VALVE AV Area (Vmax):    3.69 cm AV Area (Vmean):   3.26 cm AV Area (VTI):     4.18 cm AV Vmax:           117.00 cm/s AV Vmean:          84.200 cm/s AV VTI:            0.179 m AV Peak Grad:      5.5 mmHg AV Mean Grad:      3.0 mmHg LVOT Vmax:         81.30 cm/s LVOT Vmean:        51.700 cm/s LVOT VTI:          0.141 m LVOT/AV VTI ratio: 0.79  AORTA Ao Root diam: 3.60 cm Ao Asc diam:  3.50 cm MITRAL VALVE MV Area VTI:  3.39 cm   SHUNTS MV Peak grad: 3.0 mmHg   Systemic VTI:  0.14 m MV Mean grad: 2.0 mmHg   Systemic Diam: 2.60 cm MV Vmax:      0.87 m/s MV Vmean:     65.4 cm/s Nona Dell MD Electronically signed by Nona Dell MD Signature Date/Time: 05/06/2021/1:11:44 PM    Final     Scheduled Meds:  atorvastatin  40 mg Oral Daily   carvedilol  6.25 mg Oral BID WC   enoxaparin (LOVENOX) injection  50 mg Subcutaneous Q24H   folic acid  1 mg Oral Daily   furosemide  40 mg Intravenous Daily   losartan  50 mg Oral Daily   multivitamin with minerals  1 tablet Oral Daily   thiamine  100 mg Oral Daily   Or   thiamine  100 mg Intravenous Daily   Continuous Infusions:    LOS: 1 day   Time spent: 35 minutes   Greer Koeppen Estill Cotta, MD [facility]  If 7PM-7AM, please contact night-coverage [email or web address] 05/07/2021, 1:50 PM

## 2021-05-07 NOTE — Progress Notes (Addendum)
Progress Note  Patient Name: Joseph Reese Date of Encounter: 05/07/2021  [facility] Cardiologist: used to be followed by cardiologist in [place]  Subjective   Denies any CP or SOB. Felt his breathing is back to baseline  Inpatient Medications    Scheduled Meds:  atorvastatin  40 mg Oral Daily   enoxaparin (LOVENOX) injection  50 mg Subcutaneous Q24H   folic acid  1 mg Oral Daily   furosemide  40 mg Intravenous Daily   losartan  50 mg Oral Daily   multivitamin with minerals  1 tablet Oral Daily   thiamine  100 mg Oral Daily   Or   thiamine  100 mg Intravenous Daily   Continuous Infusions:  magnesium sulfate bolus IVPB     PRN Meds: acetaminophen, hydrALAZINE, LORazepam **OR** LORazepam   Vital Signs    Vitals:   05/06/21 1445 05/06/21 1522 05/06/21 2026 05/07/21 0535  BP:  (!) 142/102 127/90 (!) 135/102  Pulse: 90 97 93 (!) 103  Resp: (!) Temp:  98.9 F (37.2 C) 98.9 F (37.2 C) 98.1 F (36.7 C)  TempSrc:  Oral Oral Oral  SpO2: 98% 100% 100% 98%  Weight:  101.3 kg  98.3 kg  Height:   (1.753 m)      Intake/Output Summary (Last 24 hours) at 05/07/2021 0801 Last data filed at 05/07/2021 0745 Gross per 24 hour  Intake 240 ml  Output 300 ml  Net -60 ml   Last 3 Weights 05/07/2021 05/06/2021 05/06/2021  Weight (lbs) 216 lb 11.2 oz 223 lb 5.2 oz 216 lb 0.8 oz  Weight (kg) 98.294 kg 101.3 kg 98 kg      Telemetry    NSR without significant ventricular ectopy - Personally Reviewed  ECG    Sinus tachycardia, ST depression in the lateral leads - Personally Reviewed  Physical Exam   GEN: No acute distress.   Neck: No JVD Cardiac: RRR, no murmurs, rubs, or gallops.  Respiratory: Clear to auscultation bilaterally. GI: Soft, nontender, non-distended  MS: No edema; No deformity. Neuro:  Nonfocal  Psych: Normal affect   Labs    High Sensitivity Troponin:   Recent Labs  Lab 05/06/21 0222 05/06/21 0226 05/06/21 0535  05/06/21 0645  TROPONINIHS 58* 32* 54* 65*     Chemistry Recent Labs  Lab 05/06/21 0233 05/06/21 0249 05/06/21 0535 05/06/21 0645 05/07/21 0334  NA 136 141 138  --  139  K 3.3* 3.2* 4.2  --  3.9  CL 106  --  107  --  105  CO2 19*  --  23  --  26  GLUCOSE 294*  --  97  --  115*  BUN 11  --  12  --  13  CREATININE 1.23  --  1.00 1.05 0.96  CALCIUM 8.5*  --  8.8*  --  9.0  MG  --   --   --  2.1 1.9  PROT 7.4  --   --   --   --   ALBUMIN 3.2*  --   --   --   --   AST 41  --   --   --   --   ALT 32  --   --   --   --   ALKPHOS 74  --   --   --   --   BILITOT 0.6  --   --   --   --  GFRNONAA >60  --  >60 >60 >60  ANIONGAP 11  --  8  --  8    Lipids  Recent Labs  Lab 05/06/21 0233  CHOL 296*  TRIG 111  HDL 55  LDLCALC 219*  CHOLHDL 5.4    Hematology Recent Labs  Lab 05/06/21 0226 05/06/21 0249 05/06/21 0645  WBC 6.9  --  7.4  RBC 5.22  --  5.40  HGB 14.0 14.6 14.7  HCT 44.7 43.0 45.4  MCV 85.6  --  84.1  MCH 26.8  --  27.2  MCHC 31.3  --  32.4  RDW 13.7  --  13.8  PLT 266  --  267   Thyroid  Recent Labs  Lab 05/06/21 0645  TSH 0.746    BNP Recent Labs  Lab 05/06/21 0226  BNP 571.8*    DDimer No results for input(s): DDIMER in the last 168 hours.   Radiology    DG Chest Portable 1 View  Result Date: 05/06/2021 CLINICAL DATA:  Shortness of breath. EXAM: PORTABLE CHEST 1 VIEW COMPARISON:  April 11, 2021 FINDINGS: Mild, diffusely increased interstitial lung markings are noted. Mild to moderate severity areas of atelectasis and/or infiltrate are seen within the bilateral lung bases. There is no evidence of a pleural effusion or pneumothorax. The cardiac silhouette is borderline in size and unchanged in appearance. The visualized skeletal structures are unremarkable. IMPRESSION: Mild interstitial edema with mild to moderate severity bibasilar atelectasis and/or infiltrate. Electronically Signed   By: Aram Candela M.D.   On: 05/06/2021 02:44    ECHOCARDIOGRAM COMPLETE  Result Date: 05/06/2021    ECHOCARDIOGRAM REPORT   Patient Name:   Joseph Reese Date of Exam: 05/06/2021 Medical Rec #:  [id number]   Height:       69.0 in Accession #:    [id number]  Weight:       216.0 lb Date of Birth:  Apr 07, 1966    BSA:          2.135 m Patient Age:    55 years    BP:           134/100 mmHg Patient Gender: M           HR:           84 bpm. Exam Location:  Inpatient Procedure: 2D Echo, Cardiac Doppler and Color Doppler Indications:    CHF  History:        Patient has no prior history of Echocardiogram examinations.                 CHF, Signs/Symptoms:Shortness of Breath; Risk Factors:Diabetes,                 Hypertension, Dyslipidemia and Current Smoker. Cocaine use.  Sonographer:    Ross Ludwig RDCS (AE) Referring Phys: [id number] Meryle Ready [facility] IMPRESSIONS  1. Left ventricular ejection fraction, by estimation, is 15-20%. The left ventricle has severely decreased function. The left ventricle demonstrates regional wall motion abnormalities (see scoring diagram/findings for description). There is moderate left ventricular hypertrophy. Left ventricular diastolic parameters were normal.  2. Right ventricular systolic function is mildly reduced. The right ventricular size is normal. Tricuspid regurgitation signal is inadequate for assessing PA pressure.  3. Left atrial size was mild to moderately dilated.  4. The mitral valve is grossly normal. Mild mitral valve regurgitation.  5. The aortic valve is tricuspid. There is mild calcification of the aortic valve. Aortic valve regurgitation is mild. No aortic  stenosis is present. Aortic valve mean gradient measures 3.0 mmHg.  6. The inferior vena cava is normal in size with greater than 50% respiratory variability, suggesting right atrial pressure of 3 mmHg. Comparison(s): No prior Echocardiogram. FINDINGS  Left Ventricle: Left ventricular ejection fraction, by estimation, is 15-20%. The left ventricle has severely decreased  function. The left ventricle demonstrates regional wall motion abnormalities. The left ventricular internal cavity size was normal in  size. There is moderate left ventricular hypertrophy. Left ventricular diastolic parameters were normal.  LV Wall Scoring: The inferior wall, mid inferolateral segment, mid inferoseptal segment, and basal inferoseptal segment are akinetic. The entire anterior wall, antero-lateral wall, entire anterior septum, entire apex, and basal inferolateral segment are hypokinetic. Right Ventricle: The right ventricular size is normal. No increase in right ventricular wall thickness. Right ventricular systolic function is mildly reduced. Tricuspid regurgitation signal is inadequate for assessing PA pressure. Left Atrium: Left atrial size was mild to moderately dilated. Right Atrium: Right atrial size was normal in size. Pericardium: There is no evidence of pericardial effusion. Mitral Valve: The mitral valve is grossly normal. There is mild thickening of the mitral valve leaflet(s). Mild mitral valve regurgitation. MV peak gradient, 3.0 mmHg. The mean mitral valve gradient is 2.0 mmHg. Tricuspid Valve: The tricuspid valve is grossly normal. Tricuspid valve regurgitation is trivial. Aortic Valve: The aortic valve is tricuspid. There is mild calcification of the aortic valve. There is mild aortic valve annular calcification. Aortic valve regurgitation is mild. No aortic stenosis is present. Aortic valve mean gradient measures 3.0 mmHg. Aortic valve peak gradient measures 5.5 mmHg. Aortic valve area, by VTI measures 4.18 cm. Pulmonic Valve: The pulmonic valve was grossly normal. Pulmonic valve regurgitation is trivial. Aorta: The aortic root is normal in size and structure. Venous: The inferior vena cava is normal in size with greater than 50% respiratory variability, suggesting right atrial pressure of 3 mmHg. IAS/Shunts: No atrial level shunt detected by color flow Doppler.  LEFT VENTRICLE PLAX  2D LVIDd:         5.60 cm   Diastology LVIDs:         5.10 cm   LV e' medial:  5.77 cm/s LV PW:         2.00 cm   LV e' lateral: 7.15 cm/s LV IVS:        1.60 cm LVOT diam:     2.60 cm LV SV:         75 LV SV Index:   35 LVOT Area:     5.31 cm  RIGHT VENTRICLE            IVC RV Basal diam:  3.50 cm    IVC diam: 1.90 cm RV S prime:     8.73 cm/s TAPSE (M-mode): 1.7 cm LEFT ATRIUM             Index        RIGHT ATRIUM           Index LA diam:        5.40 cm 2.53 cm/m   RA Area:     11.10 cm LA Vol (A2C):   99.0 ml 46.37 ml/m  RA Volume:   23.30 ml  10.91 ml/m LA Vol (A4C):   91.3 ml 42.77 ml/m LA Biplane Vol: 98.5 ml 46.14 ml/m  AORTIC VALVE AV Area (Vmax):    3.69 cm AV Area (Vmean):   3.26 cm AV Area (VTI):     4.18  cm AV Vmax:           117.00 cm/s AV Vmean:          84.200 cm/s AV VTI:            0.179 m AV Peak Grad:      5.5 mmHg AV Mean Grad:      3.0 mmHg LVOT Vmax:         81.30 cm/s LVOT Vmean:        51.700 cm/s LVOT VTI:          0.141 m LVOT/AV VTI ratio: 0.79  AORTA Ao Root diam: 3.60 cm Ao Asc diam:  3.50 cm MITRAL VALVE MV Area VTI:  3.39 cm   SHUNTS MV Peak grad: 3.0 mmHg   Systemic VTI:  0.14 m MV Mean grad: 2.0 mmHg   Systemic Diam: 2.60 cm MV Vmax:      0.87 m/s MV Vmean:     65.4 cm/s Nona Dell MD Electronically signed by Nona Dell MD Signature Date/Time: 05/06/2021/1:11:44 PM    Final     Cardiac Studies   Echo 05/06/2021 IMPRESSIONS     1. Left ventricular ejection fraction, by estimation, is 15-20%. The left  ventricle has severely decreased function. The left ventricle demonstrates  regional wall motion abnormalities (see scoring diagram/findings for  description). There is moderate  left ventricular hypertrophy. Left ventricular diastolic parameters were  normal.   2. Right ventricular systolic function is mildly reduced. The right  ventricular size is normal. Tricuspid regurgitation signal is inadequate  for assessing PA pressure.   3. Left atrial size  was mild to moderately dilated.   4. The mitral valve is grossly normal. Mild mitral valve regurgitation.   5. The aortic valve is tricuspid. There is mild calcification of the  aortic valve. Aortic valve regurgitation is mild. No aortic stenosis is  present. Aortic valve mean gradient measures 3.0 mmHg.   6. The inferior vena cava is normal in size with greater than 50%  respiratory variability, suggesting right atrial pressure of 3 mmHg.   Comparison(s): No prior Echocardiogram.  Patient Profile     55 y.o. male with PMH of HTN, HLD, tobacco abuse, and cardiomyopathy presented with hypertensive emergency and acute CHF.   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   - I/O -1.3 L. Volume status near euvolemic level. Consider addition of home dose of coreg 6.25mg  BID (nonselective BB better in case he does cocaine again) and 12.5mg  daily spironolactone (home dose  daily). Potentially transition to  daily PO lasix tomorrow. Hesitant to add entresto or SGLT 2i given financial restraint unless he qualifies for medication assistance program.   - per patient, he works as a Designer, fashion/clothing. Used to follow by a cardiologist in [place]. He has not been able to pay for medications for the past 3 month. Denies any prior cardiac catheterization or stress testing. Likely Sonya Gunnoe need L and R heart cath for ischemic eval. Xaiden Fleig defer to MD.   Hypertensive emergency: out of cardiac meds for 3 month due to financial restraint.   +Cocaine: positive UDT. Need to stop. Discussed cocaine's effect on the heart.  HLD: continue home lipitor.   Prediabetes: hemoglobin A1C 6.4.  For questions or updates, please contact [facility] Please consult [email or web address] for contact  info under        Signed, Azalee Course, PA  05/07/2021, 8:01 AM    I have seen and examined this patient with Azalee Course.  Agree with above, note added to reflect my findings.  Currently feeling well.  Breathing is close to normal.  He continues to diurese well.  Net -1.7 L.  Creatinine has improved with diuresis.  GEN: Well nourished, well developed, in no acute distress  HEENT: normal  Neck: no JVD, carotid bruits, or masses Cardiac: RRR; no murmurs, rubs, or gallops,no edema  Respiratory:  clear to auscultation bilaterally, normal work of breathing GI: soft, nontender, nondistended, + BS MS: no deformity or atrophy  Skin: warm and dry Neuro:  Strength and sensation are intact Psych: euthymic mood, full affect   Acute on chronic systolic heart failure: Outside records show an ejection fraction of 25 to 30%.  Echo here shows an ejection fraction of 15 to 20%.  His volume status appears euvolemic.  Agree with adding carvedilol.  We Nataya Bastedo transition to p.o. Lasix tomorrow.  Left heart catheterization would be a reasonable option to determine the cause of his reduced ejection fraction.  We Rowen Wilmer continue to evaluate. Hypertensive emergency: Blood pressure better controlled.  Continue with current management.  Lenzie Sandler M. Liona Wengert MD 05/07/2021 10:55 AM

## 2021-05-08 LAB — BASIC METABOLIC PANEL
Anion gap: 10 (ref 5–15)
BUN: 13 mg/dL (ref 6–20)
CO2: 24 mmol/L (ref 22–32)
Calcium: 9 mg/dL (ref 8.9–10.3)
Chloride: 103 mmol/L (ref 98–111)
Creatinine, Ser: 0.8 mg/dL (ref 0.61–1.24)
GFR, Estimated: >60 mL/min (ref >60)
Glucose, Bld: 98 mg/dL (ref 70–99)
Potassium: 3.8 mmol/L (ref 3.5–5.1)
Sodium: 137 mmol/L (ref 135–145)

## 2021-05-08 LAB — MAGNESIUM: Magnesium: 1.8 mg/dL (ref 1.7–2.4)

## 2021-05-08 MED ORDER — MAGNESIUM SULFATE 2 GM/50ML IV SOLN
2.0000 g | Freq: Once | INTRAVENOUS | Status: AC
  Administered 2021-05-08: 08:00:00 2 g via INTRAVENOUS
  Filled 2021-05-08: qty 50

## 2021-05-08 MED ORDER — SPIRONOLACTONE 12.5 MG HALF TABLET: 12.5000 mg | ORAL_TABLET | Freq: Every day | ORAL | Status: DC

## 2021-05-08 MED ORDER — SPIRONOLACTONE 25 MG PO TABS
25.0000 mg | ORAL_TABLET | Freq: Every day | ORAL | Status: DC
  Administered 2021-05-08 – 2021-05-09 (×2): 25 mg via ORAL
  Filled 2021-05-08 (×2): qty 1

## 2021-05-08 MED ORDER — POTASSIUM CHLORIDE 20 MEQ PO PACK
40.0000 meq | PACK | Freq: Once | ORAL | Status: AC
  Administered 2021-05-08: 08:00:00 40 meq via ORAL
  Filled 2021-05-08: qty 2

## 2021-05-08 NOTE — Progress Notes (Signed)
Mobility Specialist Progress Note    05/08/21 1654  Mobility  Activity Ambulated in hall  Level of Assistance Independent  Assistive Device None  Distance Ambulated (ft) 480 ft  Mobility Ambulated independently in hallway  Mobility Response Tolerated well  Mobility performed by Mobility specialist  Bed Position Chair  $Mobility charge 1 Mobility   Pre-Mobility: 94 HR During Mobility: 100 HR  Pt received in bed and agreeable. No complaints. Returned to chair with call bell in reach.   [facility] Mobility Specialist  M.S. Primary Phone: [phone] M.S. Secondary Phone: [phone]

## 2021-05-08 NOTE — Progress Notes (Signed)
Progress Note  Patient Name: Joseph Reese Date of Encounter: 05/08/2021  [facility] Cardiologist: used to be followed by cardiologist in [place]  Subjective   He is feeling better today and close to his baseline. He still thinks he needs another day.   Crt remains normal I/O net negative 1.9 L; total 3.2 L   Inpatient Medications    Scheduled Meds:  atorvastatin  40 mg Oral Daily   carvedilol  6.25 mg Oral BID WC   enoxaparin (LOVENOX) injection  50 mg Subcutaneous Q24H   folic acid  1 mg Oral Daily   furosemide  40 mg Intravenous Daily   losartan  50 mg Oral Daily   multivitamin with minerals  1 tablet Oral Daily   thiamine  100 mg Oral Daily   Or   thiamine  100 mg Intravenous Daily   Continuous Infusions:   PRN Meds: acetaminophen, hydrALAZINE, LORazepam **OR** LORazepam   Vital Signs    Vitals:   05/07/21 2000 05/08/21 0013 05/08/21 0426 05/08/21 0757  BP: 116/89 (!) 118/97 (!) 122/97 (!) 121/98  Pulse: 90 81 91 88  Resp: 18     Temp: 98.1 F (36.7 C) 98.4 F (36.9 C) 97.8 F (36.6 C)   TempSrc: Oral Oral Oral   SpO2: 98% 96% 94%   Weight:   96.8 kg   Height:        Intake/Output Summary (Last 24 hours) at 05/08/2021 0909 Last data filed at 05/08/2021 0700 Gross per 24 hour  Intake 770 ml  Output 3000 ml  Net -2230 ml   Last 3 Weights 05/08/2021 05/07/2021 05/06/2021  Weight (lbs) 213 lb 6.4 oz 216 lb 11.2 oz 223 lb 5.2 oz  Weight (kg) 96.798 kg 98.294 kg 101.3 kg      Telemetry    NSR without significant ventricular ectopy - Personally Reviewed  ECG   No new-personally reviewed   Prior Sinus tachycardia, ST depression in the lateral leads  Physical Exam   GEN: No acute distress.  Lying flat comfortably Neck: No JVD Cardiac: RRR, no murmurs, rubs, or gallops.  Respiratory: Clear to auscultation bilaterally. GI: Soft, nontender, non-distended  MS: No edema; No deformity. Neuro:  Nonfocal  Psych: Normal affect    Labs    High Sensitivity Troponin:   Recent Labs  Lab 05/06/21 0222 05/06/21 0226 05/06/21 0535 05/06/21 0645  TROPONINIHS 58* 32* 54* 65*     Chemistry Recent Labs  Lab 05/06/21 0233 05/06/21 0249 05/06/21 0535 05/06/21 0645 05/07/21 0334 05/08/21 0456  NA 136   < > 138  --  139 137  K 3.3*   < > 4.2  --  3.9 3.8  CL 106  --  107  --  105 103  CO2 19*  --  23  --  26 24  GLUCOSE 294*  --  97  --  115* 98  BUN 11  --  12  --  13 13  CREATININE 1.23  --  1.00 1.05 0.96 0.80  CALCIUM 8.5*  --  8.8*  --  9.0 9.0  MG  --   --   --  2.1 1.9 1.8  PROT 7.4  --   --   --   --   --   ALBUMIN 3.2*  --   --   --   --   --   AST 41  --   --   --   --   --  ALT 32  --   --   --   --   --   ALKPHOS 74  --   --   --   --   --   BILITOT 0.6  --   --   --   --   --   GFRNONAA >60  --  >60 >60 >60 >60  ANIONGAP 11  --  8  --  8 10   < > = values in this interval not displayed.    Lipids  Recent Labs  Lab 05/06/21 0233  CHOL 296*  TRIG 111  HDL 55  LDLCALC 219*  CHOLHDL 5.4    Hematology Recent Labs  Lab 05/06/21 0226 05/06/21 0249 05/06/21 0645  WBC 6.9  --  7.4  RBC 5.22  --  5.40  HGB 14.0 14.6 14.7  HCT 44.7 43.0 45.4  MCV 85.6  --  84.1  MCH 26.8  --  27.2  MCHC 31.3  --  32.4  RDW 13.7  --  13.8  PLT 266  --  267   Thyroid  Recent Labs  Lab 05/06/21 0645  TSH 0.746    BNP Recent Labs  Lab 05/06/21 0226  BNP 571.8*    DDimer No results for input(s): DDIMER in the last 168 hours.   Radiology    ECHOCARDIOGRAM COMPLETE  Result Date: 05/06/2021    ECHOCARDIOGRAM REPORT   Patient Name:   Joseph Reese Date of Exam: 05/06/2021 Medical Rec #:  [id number]   Height:       69.0 in Accession #:    [id number]  Weight:       216.0 lb Date of Birth:  23-Feb-1966    BSA:          2.135 m Patient Age:    55 years    BP:           134/100 mmHg Patient Gender: M           HR:           84 bpm. Exam Location:  Inpatient Procedure: 2D Echo, Cardiac Doppler and Color  Doppler Indications:    CHF  History:        Patient has no prior history of Echocardiogram examinations.                 CHF, Signs/Symptoms:Shortness of Breath; Risk Factors:Diabetes,                 Hypertension, Dyslipidemia and Current Smoker. Cocaine use.  Sonographer:    Ross Ludwig RDCS (AE) Referring Phys: [id number] Meryle Ready [facility] IMPRESSIONS  1. Left ventricular ejection fraction, by estimation, is 15-20%. The left ventricle has severely decreased function. The left ventricle demonstrates regional wall motion abnormalities (see scoring diagram/findings for description). There is moderate left ventricular hypertrophy. Left ventricular diastolic parameters were normal.  2. Right ventricular systolic function is mildly reduced. The right ventricular size is normal. Tricuspid regurgitation signal is inadequate for assessing PA pressure.  3. Left atrial size was mild to moderately dilated.  4. The mitral valve is grossly normal. Mild mitral valve regurgitation.  5. The aortic valve is tricuspid. There is mild calcification of the aortic valve. Aortic valve regurgitation is mild. No aortic stenosis is present. Aortic valve mean gradient measures 3.0 mmHg.  6. The inferior vena cava is normal in size with greater than 50% respiratory variability, suggesting right atrial pressure of 3 mmHg. Comparison(s): No prior Echocardiogram. FINDINGS  Left Ventricle: Left ventricular ejection fraction, by estimation, is 15-20%. The left ventricle has severely decreased function. The left ventricle demonstrates regional wall motion abnormalities. The left ventricular internal cavity size was normal in  size. There is moderate left ventricular hypertrophy. Left ventricular diastolic parameters were normal.  LV Wall Scoring: The inferior wall, mid inferolateral segment, mid inferoseptal segment, and basal inferoseptal segment are akinetic. The entire anterior wall, antero-lateral wall, entire anterior septum, entire apex, and  basal inferolateral segment are hypokinetic. Right Ventricle: The right ventricular size is normal. No increase in right ventricular wall thickness. Right ventricular systolic function is mildly reduced. Tricuspid regurgitation signal is inadequate for assessing PA pressure. Left Atrium: Left atrial size was mild to moderately dilated. Right Atrium: Right atrial size was normal in size. Pericardium: There is no evidence of pericardial effusion. Mitral Valve: The mitral valve is grossly normal. There is mild thickening of the mitral valve leaflet(s). Mild mitral valve regurgitation. MV peak gradient, 3.0 mmHg. The mean mitral valve gradient is 2.0 mmHg. Tricuspid Valve: The tricuspid valve is grossly normal. Tricuspid valve regurgitation is trivial. Aortic Valve: The aortic valve is tricuspid. There is mild calcification of the aortic valve. There is mild aortic valve annular calcification. Aortic valve regurgitation is mild. No aortic stenosis is present. Aortic valve mean gradient measures 3.0 mmHg. Aortic valve peak gradient measures 5.5 mmHg. Aortic valve area, by VTI measures 4.18 cm. Pulmonic Valve: The pulmonic valve was grossly normal. Pulmonic valve regurgitation is trivial. Aorta: The aortic root is normal in size and structure. Venous: The inferior vena cava is normal in size with greater than 50% respiratory variability, suggesting right atrial pressure of 3 mmHg. IAS/Shunts: No atrial level shunt detected by color flow Doppler.  LEFT VENTRICLE PLAX 2D LVIDd:         5.60 cm   Diastology LVIDs:         5.10 cm   LV e' medial:  5.77 cm/s LV PW:         2.00 cm   LV e' lateral: 7.15 cm/s LV IVS:        1.60 cm LVOT diam:     2.60 cm LV SV:         75 LV SV Index:   35 LVOT Area:     5.31 cm  RIGHT VENTRICLE            IVC RV Basal diam:  3.50 cm    IVC diam: 1.90 cm RV S prime:     8.73 cm/s TAPSE (M-mode): 1.7 cm LEFT ATRIUM             Index        RIGHT ATRIUM           Index LA diam:        5.40 cm  2.53 cm/m   RA Area:     11.10 cm LA Vol (A2C):   99.0 ml 46.37 ml/m  RA Volume:   23.30 ml  10.91 ml/m LA Vol (A4C):   91.3 ml 42.77 ml/m LA Biplane Vol: 98.5 ml 46.14 ml/m  AORTIC VALVE AV Area (Vmax):    3.69 cm AV Area (Vmean):   3.26 cm AV Area (VTI):     4.18 cm AV Vmax:           117.00 cm/s AV Vmean:          84.200 cm/s AV VTI:  0.179 m AV Peak Grad:      5.5 mmHg AV Mean Grad:      3.0 mmHg LVOT Vmax:         81.30 cm/s LVOT Vmean:        51.700 cm/s LVOT VTI:          0.141 m LVOT/AV VTI ratio: 0.79  AORTA Ao Root diam: 3.60 cm Ao Asc diam:  3.50 cm MITRAL VALVE MV Area VTI:  3.39 cm   SHUNTS MV Peak grad: 3.0 mmHg   Systemic VTI:  0.14 m MV Mean grad: 2.0 mmHg   Systemic Diam: 2.60 cm MV Vmax:      0.87 m/s MV Vmean:     65.4 cm/s Nona Dell MD Electronically signed by Nona Dell MD Signature Date/Time: 05/06/2021/1:11:44 PM    Final     Cardiac Studies   Echo 05/06/2021 IMPRESSIONS     1. Left ventricular ejection fraction, by estimation, is 15-20%. The left  ventricle has severely decreased function. The left ventricle demonstrates  regional wall motion abnormalities (see scoring diagram/findings for  description). There is moderate  left ventricular hypertrophy. Left ventricular diastolic parameters were  normal.   2. Right ventricular systolic function is mildly reduced. The right  ventricular size is normal. Tricuspid regurgitation signal is inadequate  for assessing PA pressure.   3. Left atrial size was mild to moderately dilated.   4. The mitral valve is grossly normal. Mild mitral valve regurgitation.   5. The aortic valve is tricuspid. There is mild calcification of the  aortic valve. Aortic valve regurgitation is mild. No aortic stenosis is  present. Aortic valve mean gradient measures 3.0 mmHg.   6. The inferior vena cava is normal in size with greater than 50%  respiratory variability, suggesting right atrial pressure of 3 mmHg.    Comparison(s): No prior Echocardiogram.  Patient Profile     55 y.o. male with PMH of HTN, HLD, tobacco abuse, and cardiomyopathy presented with hypertensive emergency and acute CHF.   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   -Likely can transition to oral lasix tomorrow (40 mg daily)  -continue prescribed coreg 6.25 mg BID (with cocaine +)  -restarted prescribed spironolactone 25 mg daily  -Hesitant to add entresto or SGLT 2i given financial restraint unless he qualifies for medication assistance program.(Can engage HF pharmacy for assistance)  - per patient, he works as a Designer, fashion/clothing. Used to follow by a cardiologist in [place]. He has not been able to pay for medications for the past 3 month. Denies any prior cardiac catheterization or stress testing. Likely will need L and R heart cath for ischemic eval in the future, can be done as an outpatient  Hypertensive emergency: out of cardiac meds for 3 month due to financial restraint. Restarted here.  +Cocaine: positive UDT. Need to stop. Discussed cocaine's effect on the heart.  HLD: continue home lipitor.   Prediabetes: hemoglobin A1C 6.4.       For questions or updates, please contact [facility] Please consult [email or web address] for contact info under        Signed, Maisie Fus, MD  05/08/2021, 9:09 AM

## 2021-05-08 NOTE — Progress Notes (Addendum)
Progress Note  Patient Name: Joseph Reese Date of Encounter: 05/09/2021  [facility] Cardiologist: Maisie Fus, MD New this admission   Subjective   Patient reports that he feels great today and is ready to go home.  Denies any chest pain, shortness of breath, palpitations, lightheadedness.  Reports that his cough and swelling in his legs have resolved.   Inpatient Medications    Scheduled Meds:  atorvastatin  40 mg Oral Daily   carvedilol  6.25 mg Oral BID WC   enoxaparin (LOVENOX) injection  50 mg Subcutaneous Q24H   folic acid  1 mg Oral Daily   furosemide  40 mg Intravenous Daily   losartan  50 mg Oral Daily   multivitamin with minerals  1 tablet Oral Daily   spironolactone  25 mg Oral Daily   thiamine  100 mg Oral Daily   Or   thiamine  100 mg Intravenous Daily   Continuous Infusions:  PRN Meds: acetaminophen, hydrALAZINE   Vital Signs    Vitals:   05/08/21 1838 05/08/21 2128 05/09/21 0433 05/09/21 0813  BP: 92/80 (!) 124/98 (!) 131/98 113/64  Pulse: 85 81 70 87  Resp:  18 16   Temp:  98.5 F (36.9 C) 98.3 F (36.8 C)   TempSrc:  Oral Oral   SpO2:  95% 97%   Weight:   96.8 kg   Height:        Intake/Output Summary (Last 24 hours) at 05/09/2021 0853 Last data filed at 05/09/2021 0230 Gross per 24 hour  Intake 50 ml  Output 1680 ml  Net -1630 ml   Last 3 Weights 05/09/2021 05/08/2021 05/07/2021  Weight (lbs) 213 lb 6.4 oz 213 lb 6.4 oz 216 lb 11.2 oz  Weight (kg) 96.798 kg 96.798 kg 98.294 kg      Telemetry    Normal sinus rhythm, occasional PVCs, heart rate in the 80s- Personally Reviewed  ECG    No new tracings - Personally Reviewed  Physical Exam   GEN: No acute distress.   Neck: No JVD, no carotid bruits  Cardiac: RRR, no murmurs, rubs, or gallops.  Respiratory: Distant lung sounds, no wheezes, rales, rhonchi  GI: Soft, nontender, non-distended  MS: No edema; No deformity. Neuro:  Nonfocal  Psych: Normal affect   Labs     High Sensitivity Troponin:   Recent Labs  Lab 05/06/21 0222 05/06/21 0226 05/06/21 0535 05/06/21 0645  TROPONINIHS 58* 32* 54* 65*     Chemistry Recent Labs  Lab 05/06/21 0233 05/06/21 0249 05/07/21 0334 05/08/21 0456 05/09/21 0320  NA 136   < > 139 137 138  K 3.3*   < > 3.9 3.8 3.8  CL 106   < > 105 103 102  CO2 19*   < > 26 24 26   GLUCOSE 294*   < > 115* 98 103*  BUN 11   < > 13 13 16   CREATININE 1.23   < > 0.96 0.80 0.93  CALCIUM 8.5*   < > 9.0 9.0 9.2  MG  --    < > 1.9 1.8 2.1  PROT 7.4  --   --   --   --   ALBUMIN 3.2*  --   --   --   --   AST 41  --   --   --   --   ALT 32  --   --   --   --   ALKPHOS 74  --   --   --   --  BILITOT 0.6  --   --   --   --   GFRNONAA >60   < > >60 >60 >60  ANIONGAP 11   < > 8 10 10    < > = values in this interval not displayed.    Lipids  Recent Labs  Lab 05/06/21 0233  CHOL 296*  TRIG 111  HDL 55  LDLCALC 219*  CHOLHDL 5.4    Hematology Recent Labs  Lab 05/06/21 0226 05/06/21 0249 05/06/21 0645  WBC 6.9  --  7.4  RBC 5.22  --  5.40  HGB 14.0 14.6 14.7  HCT 44.7 43.0 45.4  MCV 85.6  --  84.1  MCH 26.8  --  27.2  MCHC 31.3  --  32.4  RDW 13.7  --  13.8  PLT 266  --  267   Thyroid  Recent Labs  Lab 05/06/21 0645  TSH 0.746    BNP Recent Labs  Lab 05/06/21 0226  BNP 571.8*    DDimer No results for input(s): DDIMER in the last 168 hours.   Radiology    No results found.  Cardiac Studies   Echo 05/06/2021 IMPRESSIONS   1. Left ventricular ejection fraction, by estimation, is 15-20%. The left  ventricle has severely decreased function. The left ventricle demonstrates  regional wall motion abnormalities (see scoring diagram/findings for  description). There is moderate  left ventricular hypertrophy. Left ventricular diastolic parameters were  normal.   2. Right ventricular systolic function is mildly reduced. The right  ventricular size is normal. Tricuspid regurgitation signal is inadequate   for assessing PA pressure.   3. Left atrial size was mild to moderately dilated.   4. The mitral valve is grossly normal. Mild mitral valve regurgitation.   5. The aortic valve is tricuspid. There is mild calcification of the  aortic valve. Aortic valve regurgitation is mild. No aortic stenosis is  present. Aortic valve mean gradient measures 3.0 mmHg.   6. The inferior vena cava is normal in size with greater than 50%  respiratory variability, suggesting right atrial pressure of 3 mmHg.   Comparison(s): No prior Echocardiogram.  Patient Profile     55 y.o. male with PMH of HTN, HLD, tobacco abuse, and cardiomyopathy who presented with hypertensive emergency and acute CHF.  Assessment & Plan    #Acute on Chronic Combined Systolic Heart Failure: -Outside record indicate prior echo in Jan 2021 showed EF 25-30% -Echo 05/06/2021 showed EF 15-20%, severe LV dysfunction, moderate LVH, (normal diastolic parameter?), mildly reduced RVSF, mild AI - Patient euvolemic with net -5.12 L fluids since admission  - Transition to oral lasix 40 mg daily (home dose)  - Continue coreg 6.25mg  BID (nonselective BB chosen due to cocaine, counseled on abstinence of cocaine this admission) - Continue spiro 25mg  daily - Patient on losartan 50 mg daily, consider transitioning to entresto but would like to minimize barriers to initial GDMT as cost has a been problem - Consider SGLT2i as outpatient - Will discuss timing of R/LHC with MD prior to discharge - may be somewhat challenging as OP given lack of insurance - Reached out to [facility] HF navigator, they will also see to arrange close f/u  #Hypertensive Emergency: Occurred in the setting of being off medications due to financial issues. Medications restarted inpatient  - BP 113/64 on 12/27  #Cocaine abuse/tobacco abuse/ETOH abuse: - Patient counseled on the effects of substances on the heart and encouraged to stop   #HLD: LDL 219 and  HDL 55 on 12/24 -  Increased lipitor to 80 mg daily   #Mildly elevated troponin - low/flat peak of 65, suspect demand ischemia but will clarify cath plan  For questions or updates, please contact [facility] Please consult [email or web address] for contact info under     Signed, Jonita Albee, PA-C  05/09/2021, 8:53 AM     Patient seen and examined and agree with Robet Leu, PA-C as detailed above.  In brief, the patient is a 55 year old male with history of known heart failure with reduced ejection fraction, HTN, HLD, tobacco abuse, alcohol abuse, and history of noncompliance who presented to the ER with increasing SOB found to have acute on chronic HF exacerbation and hypertensive emergency for which Cardiology was consulted.  Of note, the patient has history of HFrEF with EF in 05/2019 from OSH 25-30%. Repeat TTE here with EF 15-20%, moderate LVH, mildly reduced RV systolic function and mild AI. Had been followed in [place] and records not available. Per patient, no history of cath/ischemic work-up. Has been noncompliant with medications and ran out prior to his admission here likely contributing to his acute presentation.  Here, he has been successfully diuresed and resumed on antihypertensives with significant improvement. Given concern for noncompliance, RHC/LHC deferred to the out-patient setting to ensure he is following up regularly and is compliant with medications as would need to be compliant with DAPT if PCI needed/performed.  GEN: No acute distress.   Neck: No JVD Cardiac: RRR, no murmurs, rubs, or gallops.  Respiratory: Diminished but clear GI: Soft, nontender, non-distended  MS: No edema; No deformity. Neuro:  Nonfocal  Psych: Normal affect    Plan: -Transitioned to lasix 40mg  PO daily -Continue coreg 6.25mg  BID (needs nonselective BB due to cocaine use) -Continue spiro 25mg  daily, continue losartan 50mg  daily -Goal eventually is to transition to entresto and start SGLT2i,  however, patient does not have insurance at this time -TOC HF consulted and follow-up arranged; will try to get him on Medicaid -Encourage alcohol, drug and tobacco cessation -If compliant as an out-patient, will need RHC/LHC at that time  , MD

## 2021-05-08 NOTE — Progress Notes (Signed)
PROGRESS NOTE    Joseph Reese  JXB:[id number] DOB: October 24, 1965 DOA: 05/06/2021 PCP: Oneita Hurt, No   Brief Narrative:  Joseph Reese is a 55 y.o. male with history of hypertension, hyperlipidemia, CHF presents to the ER with sudden onset of shortness of breath when he was sleeping which woke him up from the sleep.  Denies any chest pain.  Patient states last few days he has been mildly short of breath.  Patient states he has history of hypertension which was diagnosed in 2019 and was prescribed medications which he has not taken for the last 3 months because he was not able to afford.  Admits to taking cocaine about 3 days ago drinks alcohol every day and smokes cigarettes.   ED Course: In the ER patient was found to have blood pressure systolic more than 200 diastolic more than 110.  Cardiology on-call was consulted.  EKG shows sinus tachycardia chest x-ray shows congestion.  Cardiology recommended admitting for acute pulmonary edema Lasix 40 mg IV was given but blood pressure improved before nitroglycerin could be started.  BNP is 571 isolated troponin 32 hemoglobin A1c is 6.3.  COVID test negative.  Patient admitted for acute pulmonary edema with hypertensive emergency.  Assessment & Plan:  Acute hypoxemic respiratory failure in the setting of flash pulmonary edema and hypertensive emergency: Improving -Initially requiring BiPAP, now on room air -Chest x-ray suggestive of flash pulmonary edema with severely elevated blood pressure -troponin.-58--> 32--> 54--> 65.  BNP: 571.  TSH: WNL -Transthoracic echo shows severe LV dysfunction with ejection fraction of 15 to 20%, global hypokinesis with akinesis of the mid to basal inferior septal wall, mildly reduced RV contraction as well. -Continue Lasix 40 mg IV  daily-transition to p.o. tomorrow.  Continue Coreg.  Added Aldactone 25 mg daily-Appreciate cardiology recommendations-plan is right and left heart catheterization (can be done outpatient). -Strict INO's  and daily weight.  Monitor electrolytes closely and kidney function  Hypertensive emergency: Improving -Secondary to noncompliance with medications. -Continue ARB, Coreg, Aldactone and IV hydralazine as needed. -Monitor closely.  Substance abuse disorder: -History of cocaine abuse, tobacco abuse and alcohol abuse -Urine drug is positive for cocaine. -On CIWA protocol.  Continue multivitamin, thiamine and folic acid -Recommend cessation.  Hypokalemia/hypomagnesemia: Replenished.   -Goal potassium more than 4, magnesium more than 2 -Repeat BMP and magnesium level tomorrow a.m.  Prediabetes: A1c 6.4% -Monitor blood sugar closely.  Hyperlipidemia: Elevated total cholesterol and LDL -Continue atorvastatin 80 mg once daily.  Obesity with BMI of 31: Healthy lifestyle modifications recommended   DVT prophylaxis: Lovenox Code Status: Full code Family Communication: None present at bedside.  Plan of care discussed with patient in length and he verbalized understanding and agreed with it. Disposition Plan: Home Consultants:  cardilogy  Procedures:  none  Antimicrobials:  none   Status is: Observation    Subjective: Patient seen and examined.  Resting comfortably on the bed.  On room air.  Tells me that he is doing fine, no shortness of breath, chest pain, leg swelling, orthopnea or PND.  No acute events overnight.  Objective: Vitals:   05/08/21 0426 05/08/21 0757 05/08/21 0800 05/08/21 0940  BP: (!) 122/97 (!) 121/98 (!) 121/98 116/81  Pulse: 91 88 82   Resp:      Temp: 97.8 F (36.6 C)     TempSrc: Oral     SpO2: 94%  97%   Weight: 96.8 kg     Height:        Intake/Output  Summary (Last 24 hours) at 05/08/2021 1158 Last data filed at 05/08/2021 1114 Gross per 24 hour  Intake 820 ml  Output 2600 ml  Net -1780 ml    Filed Weights   05/06/21 1522 05/07/21 0535 05/08/21 0426  Weight: 101.3 kg 98.3 kg 96.8 kg    Examination:  General exam: Appears calm and  comfortable, sitting comfortably, on room air, communicating well  respiratory system: Clear to auscultation.  No wheezing or rhonchi.   Cardiovascular system: S1 & S2 heard, RRR. No JVD, murmurs, rubs, gallops or clicks. No pedal edema. Gastrointestinal system: Abdomen is nondistended, soft and nontender. No organomegaly or masses felt. Normal bowel sounds heard. Central nervous system: Alert and oriented. No focal neurological deficits. Extremities: Symmetric 5 x 5 power. Skin: No rashes, lesions or ulcers Psychiatry: Judgement and insight appear normal. Mood & affect appropriate.    Data Reviewed: I have personally reviewed following labs and imaging studies  CBC: Recent Labs  Lab 05/06/21 0226 05/06/21 0249 05/06/21 0645  WBC 6.9  --  7.4  NEUTROABS 2.9  --   --   HGB 14.0 14.6 14.7  HCT 44.7 43.0 45.4  MCV 85.6  --  84.1  PLT 266  --  267    Basic Metabolic Panel: Recent Labs  Lab 05/06/21 0233 05/06/21 0249 05/06/21 0535 05/06/21 0645 05/07/21 0334 05/08/21 0456  NA 136 141 138  --  139 137  K 3.3* 3.2* 4.2  --  3.9 3.8  CL 106  --  107  --  105 103  CO2 19*  --  23  --  26 24  GLUCOSE 294*  --  97  --  115* 98  BUN 11  --  12  --  13 13  CREATININE 1.23  --  1.00 1.05 0.96 0.80  CALCIUM 8.5*  --  8.8*  --  9.0 9.0  MG  --   --   --  2.1 1.9 1.8    GFR: Estimated Creatinine Clearance: 116.9 mL/min (by C-G formula based on SCr of 0.8 mg/dL). Liver Function Tests: Recent Labs  Lab 05/06/21 0233  AST 41  ALT 32  ALKPHOS 74  BILITOT 0.6  PROT 7.4  ALBUMIN 3.2*    No results for input(s): LIPASE, AMYLASE in the last 168 hours. No results for input(s): AMMONIA in the last 168 hours. Coagulation Profile: Recent Labs  Lab 05/06/21 0226  INR 1.0    Cardiac Enzymes: No results for input(s): CKTOTAL, CKMB, CKMBINDEX, TROPONINI in the last 168 hours. BNP (last 3 results) No results for input(s): PROBNP in the last 8760 hours. HbA1C: Recent Labs     05/06/21 0233  HGBA1C 6.4*    CBG: No results for input(s): GLUCAP in the last 168 hours. Lipid Profile: Recent Labs    05/06/21 0233  CHOL 296*  HDL 55  LDLCALC 219*  TRIG 111  CHOLHDL 5.4    Thyroid Function Tests: Recent Labs    05/06/21 0645  TSH 0.746    Anemia Panel: No results for input(s): VITAMINB12, FOLATE, FERRITIN, TIBC, IRON, RETICCTPCT in the last 72 hours. Sepsis Labs: No results for input(s): PROCALCITON, LATICACIDVEN in the last 168 hours.  Recent Results (from the past 240 hour(s))  Resp Panel by RT-PCR (Flu A&B, Covid) Nasopharyngeal Swab     Status: None   Collection Time: 05/06/21  2:27 AM   Specimen: Nasopharyngeal Swab; Nasopharyngeal(NP) swabs in vial transport medium  Result Value Ref Range Status  SARS Coronavirus 2 by RT PCR NEGATIVE NEGATIVE Final    Comment: (NOTE) SARS-CoV-2 target nucleic acids are NOT DETECTED.  The SARS-CoV-2 RNA is generally detectable in upper respiratory specimens during the acute phase of infection. The lowest concentration of SARS-CoV-2 viral copies this assay can detect is 138 copies/mL. A negative result does not preclude SARS-Cov-2 infection and should not be used as the sole basis for treatment or other patient management decisions. A negative result may occur with  improper specimen collection/handling, submission of specimen other than nasopharyngeal swab, presence of viral mutation(s) within the areas targeted by this assay, and inadequate number of viral copies(<138 copies/mL). A negative result must be combined with clinical observations, patient history, and epidemiological information. The expected result is Negative.  Fact Sheet for Patients:  BloggerCourse.com  Fact Sheet for Healthcare Providers:  SeriousBroker.it  This test is no t yet approved or cleared by the [place] FDA and  has been authorized for detection and/or diagnosis of  SARS-CoV-2 by FDA under an Emergency Use Authorization (EUA). This EUA will remain  in effect (meaning this test can be used) for the duration of the COVID-19 declaration under Section 564(b)(1) of the Act, 21 U.S.C.section 360bbb-3(b)(1), unless the authorization is terminated  or revoked sooner.       Influenza A by PCR NEGATIVE NEGATIVE Final   Influenza B by PCR NEGATIVE NEGATIVE Final    Comment: (NOTE) The Xpert Xpress SARS-CoV-2/FLU/RSV plus assay is intended as an aid in the diagnosis of influenza from Nasopharyngeal swab specimens and should not be used as a sole basis for treatment. Nasal washings and aspirates are unacceptable for Xpert Xpress SARS-CoV-2/FLU/RSV testing.  Fact Sheet for Patients: BloggerCourse.com  Fact Sheet for Healthcare Providers: SeriousBroker.it  This test is not yet approved or cleared by the [place] FDA and has been authorized for detection and/or diagnosis of SARS-CoV-2 by FDA under an Emergency Use Authorization (EUA). This EUA will remain in effect (meaning this test can be used) for the duration of the COVID-19 declaration under Section 564(b)(1) of the Act, 21 U.S.C. section 360bbb-3(b)(1), unless the authorization is terminated or revoked.  Performed at [facility], [address], [place], [place] [address]        Radiology Studies: No results found.  Scheduled Meds:  atorvastatin  40 mg Oral Daily   carvedilol  6.25 mg Oral BID WC   enoxaparin (LOVENOX) injection  50 mg Subcutaneous Q24H   folic acid  1 mg Oral Daily   furosemide  40 mg Intravenous Daily   losartan  50 mg Oral Daily   multivitamin with minerals  1 tablet Oral Daily   spironolactone  25 mg Oral Daily   thiamine  100 mg Oral Daily   Or   thiamine  100 mg Intravenous Daily   Continuous Infusions:   LOS: 2 days   Time spent: 35 minutes   Fountain Derusha Estill Cotta, MD [facility]  If  7PM-7AM, please contact night-coverage [email or web address] 05/08/2021, 11:58 AM

## 2021-05-09 ENCOUNTER — Other Ambulatory Visit (HOSPITAL_COMMUNITY): Payer: Self-pay

## 2021-05-09 ENCOUNTER — Encounter (HOSPITAL_COMMUNITY): Payer: Self-pay | Admitting: Internal Medicine

## 2021-05-09 DIAGNOSIS — I5023 Acute on chronic systolic (congestive) heart failure: Secondary | ICD-10-CM

## 2021-05-09 LAB — MAGNESIUM: Magnesium: 2.1 mg/dL (ref 1.7–2.4)

## 2021-05-09 LAB — BASIC METABOLIC PANEL
Anion gap: 10 (ref 5–15)
BUN: 16 mg/dL (ref 6–20)
CO2: 26 mmol/L (ref 22–32)
Calcium: 9.2 mg/dL (ref 8.9–10.3)
Chloride: 102 mmol/L (ref 98–111)
Creatinine, Ser: 0.93 mg/dL (ref 0.61–1.24)
GFR, Estimated: >60 mL/min (ref >60)
Glucose, Bld: 103 mg/dL — ABNORMAL HIGH (ref 70–99)
Potassium: 3.8 mmol/L (ref 3.5–5.1)
Sodium: 138 mmol/L (ref 135–145)

## 2021-05-09 MED ORDER — SPIRONOLACTONE 25 MG PO TABS
25.0000 mg | ORAL_TABLET | Freq: Every day | ORAL | 0 refills | Status: DC
  Filled 2021-05-09: qty 30, 30d supply, fill #0

## 2021-05-09 MED ORDER — CARVEDILOL 6.25 MG PO TABS
6.2500 mg | ORAL_TABLET | Freq: Two times a day (BID) | ORAL | 0 refills | Status: DC
  Filled 2021-05-09: qty 60, 30d supply, fill #0

## 2021-05-09 MED ORDER — FUROSEMIDE 40 MG PO TABS: 40.0000 mg | ORAL_TABLET | Freq: Every day | ORAL | Status: DC

## 2021-05-09 MED ORDER — LOSARTAN POTASSIUM 50 MG PO TABS
50.0000 mg | ORAL_TABLET | Freq: Every day | ORAL | 0 refills | Status: DC
  Filled 2021-05-09: qty 30, 30d supply, fill #0

## 2021-05-09 MED ORDER — ATORVASTATIN CALCIUM 80 MG PO TABS: 80.0000 mg | ORAL_TABLET | Freq: Every day | ORAL | Status: DC

## 2021-05-09 MED ORDER — FUROSEMIDE 40 MG PO TABS
40.0000 mg | ORAL_TABLET | Freq: Every day | ORAL | 0 refills | Status: DC
Start: 2021-05-10 — End: 2021-05-11
  Filled 2021-05-09: qty 30, 30d supply, fill #0

## 2021-05-09 MED ORDER — ATORVASTATIN CALCIUM 80 MG PO TABS
80.0000 mg | ORAL_TABLET | Freq: Every day | ORAL | 0 refills | Status: DC
  Filled 2021-05-09: qty 30, 30d supply, fill #0

## 2021-05-09 NOTE — Plan of Care (Signed)
  Problem: Pain Managment: Goal: General experience of comfort will improve Outcome: Progressing   Problem: Safety: Goal: Ability to remain free from injury will improve Outcome: Progressing   Problem: Skin Integrity: Goal: Risk for impaired skin integrity will decrease Outcome: Progressing   

## 2021-05-09 NOTE — Progress Notes (Signed)
Pt's HR went down to 39.Pt.was sleeping  & asymptomatic when went to assess pt.Will continue to monitor pt.Right now HR=78

## 2021-05-09 NOTE — Progress Notes (Signed)
[facility] CSW reached out to [employer] to see about screening Mr. Deskin for Medicaid as he doesn't have health insurance.    Gladis Soley, MSW, LCSWA [phone] Heart Failure Social Worker

## 2021-05-09 NOTE — TOC CAGE-AID Note (Signed)
Transition of Care [facility]) - CAGE-AID Screening   Patient Details  Name: Joseph Reese MRN: [id number] Date of Birth: Mar 26, 1966  Transition of Care [facility]) CM/SW Contact:    Lawerance Sabal, RN Phone Number: 05/09/2021, 9:37 AM   Clinical Narrative:\  Patient declined resources  CAGE-AID Screening: Substance Abuse Screening unable to be completed due to: : Patient Refused  Have You Ever Felt You Ought to Cut Down on Your Drinking or Drug Use?: Yes Have People Annoyed You By Critizing Your Drinking Or Drug Use?: No Have You Felt Bad Or Guilty About Your Drinking Or Drug Use?: Yes Have You Ever Had a Drink or Used Drugs First Thing In The Morning to Steady Your Nerves or to Get Rid of a Hangover?: Yes CAGE-AID Score: 3  Substance Abuse Education Offered: Yes  Substance abuse interventions: Other (must comment)

## 2021-05-09 NOTE — Progress Notes (Signed)
PROGRESS NOTE    Joseph Reese  EXH:[id number] DOB: 1966-05-14 DOA: 05/06/2021 PCP: Oneita Hurt, No   Brief Narrative:  Joseph Reese is a 55 y.o. male with history of hypertension, hyperlipidemia, CHF presents to the ER with sudden onset of shortness of breath when he was sleeping which woke him up from the sleep.  Denies any chest pain.  Patient states last few days he has been mildly short of breath.  Patient states he has history of hypertension which was diagnosed in 2019 and was prescribed medications which he has not taken for the last 3 months because he was not able to afford.  Admits to taking cocaine about 3 days ago drinks alcohol every day and smokes cigarettes. Patient has been diuresed and now there are talks for possible cath-- inpt vs outpatient  Assessment & Plan:  Acute hypoxemic respiratory failure in the setting of flash pulmonary edema and hypertensive emergency: Improving -Initially requiring BiPAP, now on room air -Chest x-ray suggestive of flash pulmonary edema with severely elevated blood pressure -troponin.-58--> 32--> 54--> 65.  BNP: 571.  TSH: WNL -Transthoracic echo shows severe LV dysfunction with ejection fraction of 15 to 20%, global hypokinesis with akinesis of the mid to basal inferior septal wall, mildly reduced RV contraction as well. -transition to lasix.  Continue Coreg.  Added Aldactone 25 mg daily-Appreciate cardiology recommendations-plan is right and left heart catheterization (can be done outpatient) vs inpatient  Hypertensive emergency: Improving -Secondary to noncompliance with medications. -Continue ARB, Coreg, Aldactone and IV hydralazine as needed. -Monitor closely.  Substance abuse disorder: -History of cocaine abuse, tobacco abuse and alcohol abuse -Urine drug is positive for cocaine. -On CIWA protocol.  Continue multivitamin, thiamine and folic acid -Recommend cessation.  Hypokalemia/hypomagnesemia: Replenished.   -Goal potassium more than 4,  magnesium more than 2  Prediabetes: A1c 6.4% -Monitor blood sugar closely. -carb mod diet  Hyperlipidemia: Elevated total cholesterol and LDL -Continue atorvastatin 80 mg once daily.  Obesity with BMI of 31: Healthy lifestyle modifications recommended   DVT prophylaxis: Lovenox Code Status: Full code Disposition Plan: Home when ok with cards Consultants:  cardilogy     Subjective: Feels well, no complaints   Objective: Vitals:   05/08/21 1838 05/08/21 2128 05/09/21 0433 05/09/21 0813  BP: 92/80 (!) 124/98 (!) 131/98 113/64  Pulse: 85 81 70 87  Resp:  18 16   Temp:  98.5 F (36.9 C) 98.3 F (36.8 C)   TempSrc:  Oral Oral   SpO2:  95% 97%   Weight:   96.8 kg   Height:        Intake/Output Summary (Last 24 hours) at 05/09/2021 1153 Last data filed at 05/09/2021 0230 Gross per 24 hour  Intake --  Output 480 ml  Net -480 ml   Filed Weights   05/07/21 0535 05/08/21 0426 05/09/21 0433  Weight: 98.3 kg 96.8 kg 96.8 kg    Examination:   General: Appearance:    Mildly obese male in no acute distress     Lungs:     Clear to auscultation bilaterally, respirations unlabored  Heart:    Normal heart rate. Normal rhythm. No murmurs, rubs, or gallops.    MS:   All extremities are intact.    Neurologic:   Awake, alert, oriented x 3. No apparent focal neurological           defect.       Data Reviewed: I have personally reviewed following labs and imaging studies  CBC: Recent  Labs  Lab 05/06/21 0226 05/06/21 0249 05/06/21 0645  WBC 6.9  --  7.4  NEUTROABS 2.9  --   --   HGB 14.0 14.6 14.7  HCT 44.7 43.0 45.4  MCV 85.6  --  84.1  PLT 266  --  267   Basic Metabolic Panel: Recent Labs  Lab 05/06/21 0233 05/06/21 0249 05/06/21 0535 05/06/21 0645 05/07/21 0334 05/08/21 0456 05/09/21 0320  NA 136 141 138  --  139 137 138  K 3.3* 3.2* 4.2  --  3.9 3.8 3.8  CL 106  --  107  --  105 103 102  CO2 19*  --  23  --  26 24 26   GLUCOSE 294*  --  97  --   115* 98 103*  BUN 11  --  12  --  13 13 16   CREATININE 1.23  --  1.00 1.05 0.96 0.80 0.93  CALCIUM 8.5*  --  8.8*  --  9.0 9.0 9.2  MG  --   --   --  2.1 1.9 1.8 2.1   GFR: Estimated Creatinine Clearance: 100.5 mL/min (by C-G formula based on SCr of 0.93 mg/dL). Liver Function Tests: Recent Labs  Lab 05/06/21 0233  AST 41  ALT 32  ALKPHOS 74  BILITOT 0.6  PROT 7.4  ALBUMIN 3.2*   No results for input(s): LIPASE, AMYLASE in the last 168 hours. No results for input(s): AMMONIA in the last 168 hours. Coagulation Profile: Recent Labs  Lab 05/06/21 0226  INR 1.0   Cardiac Enzymes: No results for input(s): CKTOTAL, CKMB, CKMBINDEX, TROPONINI in the last 168 hours. BNP (last 3 results) No results for input(s): PROBNP in the last 8760 hours. HbA1C: No results for input(s): HGBA1C in the last 72 hours. CBG: No results for input(s): GLUCAP in the last 168 hours. Lipid Profile: No results for input(s): CHOL, HDL, LDLCALC, TRIG, CHOLHDL, LDLDIRECT in the last 72 hours. Thyroid Function Tests: No results for input(s): TSH, T4TOTAL, FREET4, T3FREE, THYROIDAB in the last 72 hours. Anemia Panel: No results for input(s): VITAMINB12, FOLATE, FERRITIN, TIBC, IRON, RETICCTPCT in the last 72 hours. Sepsis Labs: No results for input(s): PROCALCITON, LATICACIDVEN in the last 168 hours.  Recent Results (from the past 240 hour(s))  Resp Panel by RT-PCR (Flu A&B, Covid) Nasopharyngeal Swab     Status: None   Collection Time: 05/06/21  2:27 AM   Specimen: Nasopharyngeal Swab; Nasopharyngeal(NP) swabs in vial transport medium  Result Value Ref Range Status   SARS Coronavirus 2 by RT PCR NEGATIVE NEGATIVE Final    Comment: (NOTE) SARS-CoV-2 target nucleic acids are NOT DETECTED.  The SARS-CoV-2 RNA is generally detectable in upper respiratory specimens during the acute phase of infection. The lowest concentration of SARS-CoV-2 viral copies this assay can detect is 138 copies/mL. A negative  result does not preclude SARS-Cov-2 infection and should not be used as the sole basis for treatment or other patient management decisions. A negative result may occur with  improper specimen collection/handling, submission of specimen other than nasopharyngeal swab, presence of viral mutation(s) within the areas targeted by this assay, and inadequate number of viral copies(<138 copies/mL). A negative result must be combined with clinical observations, patient history, and epidemiological information. The expected result is Negative.  Fact Sheet for Patients:  05/08/21  Fact Sheet for Healthcare Providers:  05/08/21  This test is no t yet approved or cleared by the [employer] FDA and  has been authorized for detection  and/or diagnosis of SARS-CoV-2 by FDA under an Emergency Use Authorization (EUA). This EUA will remain  in effect (meaning this test can be used) for the duration of the COVID-19 declaration under Section 564(b)(1) of the Act, 21 U.S.C.section 360bbb-3(b)(1), unless the authorization is terminated  or revoked sooner.       Influenza A by PCR NEGATIVE NEGATIVE Final   Influenza B by PCR NEGATIVE NEGATIVE Final    Comment: (NOTE) The Xpert Xpress SARS-CoV-2/FLU/RSV plus assay is intended as an aid in the diagnosis of influenza from Nasopharyngeal swab specimens and should not be used as a sole basis for treatment. Nasal washings and aspirates are unacceptable for Xpert Xpress SARS-CoV-2/FLU/RSV testing.  Fact Sheet for Patients: BloggerCourse.com  Fact Sheet for Healthcare Providers: SeriousBroker.it  This test is not yet approved or cleared by the [place] FDA and has been authorized for detection and/or diagnosis of SARS-CoV-2 by FDA under an Emergency Use Authorization (EUA). This EUA will remain in effect (meaning this test can be used)  for the duration of the COVID-19 declaration under Section 564(b)(1) of the Act, 21 U.S.C. section 360bbb-3(b)(1), unless the authorization is terminated or revoked.  Performed at [facility], [address], [place], [place] [address]        Radiology Studies: No results found.  Scheduled Meds:  [START ON 05/10/2021] atorvastatin  80 mg Oral Daily   carvedilol  6.25 mg Oral BID WC   enoxaparin (LOVENOX) injection  50 mg Subcutaneous Q24H   folic acid  1 mg Oral Daily   furosemide  40 mg Intravenous Daily   losartan  50 mg Oral Daily   multivitamin with minerals  1 tablet Oral Daily   spironolactone  25 mg Oral Daily   thiamine  100 mg Oral Daily   Or   thiamine  100 mg Intravenous Daily   Continuous Infusions:   LOS: 3 days     Joseph Art, DO [facility]  If 7PM-7AM, please contact night-coverage [email or web address] 05/09/2021, 11:53 AM

## 2021-05-09 NOTE — Progress Notes (Addendum)
Heart Failure Nurse Navigator Progress Note  PCP: Pcp, No PCP-Cardiologist: None Admission Diagnosis: resp failure with hypoxia, CHF  Admitted from: home with roommate  Presentation:   Joseph Reese presented 12/24 with increased SOB. Pt resting in recliner with legs down on room air. Patient interactive with interview process. Pt moved to [place] from [place] in 11/2020. Endorses medication non-compliance, "ran out of meds and didn't have money to get back down to [place]". Pt does not drive---agreeable to enrollment in [employer], Navigator completed and arranged ride to HV TOC. Drinks 3-4 beers 4x/week, 2-3 shot of liquor 3-4x/week x 40 years. Smokes 0.5PPD x 40 years. Will do cocaine 3-4x/mo when friends offer as pt is not able to buy. 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  Pt states he gets $250/mo in food stamps. Pt lives with a roommate and pays for renting a room---does odd-end jobs to meet rent. Pt has no insurance, states he has never applied for medicaid.   Explained benefits of Heart & Vascular Transitions of Care Clinic appointment, patient agreeable.    ECHO/ LVEF: 15-20%, mod LVH  Clinical Course:  Past Medical History:  Diagnosis Date   Diabetes mellitus without complication (HCC)    Hypertension      Social History   Socioeconomic History   Marital status: Single    Spouse name: Not on file   Number of children: 4   Years of education: Not on file   Highest education level: High school graduate  Occupational History   Occupation: unemployed    Comment: contract roofer--unable to work since 12/2020, d/t physical  Tobacco Use   Smoking status: Every Day    Packs/day: 0.50    Years: 40.00    Pack years: 20.00    Types: Cigarettes   Smokeless tobacco: Never  Vaping Use   Vaping Use: Never used  Substance and Sexual Activity   Alcohol  use: Yes    Alcohol/week: 18.0 standard drinks    Types: 12 Cans of beer, 6 Shots of liquor per week    Comment: 40 years of consistent   Drug use: Yes    Frequency: 1.0 times per week    Types: Cocaine    Comment: last use 12/21, 3-4x/mo   Sexual activity: Not on file  Other Topics Concern   Not on file  Social History Narrative   Not on file   Social Determinants of Health   Financial Resource Strain: High Risk   Difficulty of Paying Living Expenses: Very hard  Food Insecurity: No Food Insecurity   Worried About Programme researcher, broadcasting/film/video in the Last Year: Never true   Barista in the Last Year: Never true  Transportation Needs: Unmet Transportation Needs   Lack of Transportation (Medical): Yes   Lack of Transportation (Non-Medical): Yes  Physical Activity: Not on file  Stress: Not on file  Social Connections: Not on file    High Risk Criteria for Readmission and/or Poor Patient Outcomes: Heart failure hospital admissions (last 6 months): 1  No Show rate: NA Difficult social situation: YES Demonstrates medication adherence: NO Primary Language: English Literacy level: able to read/write and comprehend.  Education Assessment and Provision:  Detailed education and instructions provided on heart failure disease management including the following:  Signs and symptoms of Heart Failure When to call the physician Importance of daily weights Low  sodium diet Fluid restriction Medication management Anticipated future follow-up appointments  Patient education given on each of the above topics.  Patient acknowledges understanding via teach back method and acceptance of all instructions.  Education Materials:  "Living Better With Heart Failure" Booklet, HF zone tool, & Daily Weight Tracker Tool.  Patient has scale at home: no, will plan to give at [facility] clinic Patient has pill box at home: no, declined.   Barriers of Care:   -no PCP -no insurance -medication  noncompliance -finances tight -no transportation -smokes -cocaine use -alcohol use  Considerations/Referrals:   Referral made to Heart Failure Pharmacist Stewardship: yes, appreciated Referral made to Heart Failure CSW/NCM TOC: no, will see at [facility] clinic as potential DC today. Referral made to Heart & Vascular TOC clinic: yes, 1/6 @ 10AM  Items for Follow-up on DC/TOC: -optimize -polysubstance abuse cessation -no insurance -arrange PCP -medication compliance issues -medication cost/pt assistance -cardiology f/u plan   Ozella Rocks, MSN, RN Heart Failure Nurse Navigator [phone]

## 2021-05-09 NOTE — Discharge Summary (Signed)
Physician Discharge Summary  Joseph Reese ZOX:[id number] DOB: Sep 19, 1965 DOA: 05/06/2021  PCP: Pcp, No  Admit date: 05/06/2021 Discharge date: 05/09/2021  Admitted From: home Discharge disposition: home   Recommendations for Outpatient Follow-Up:   Outpatient cath if compliant with drug cessation/meds   Discharge Diagnosis:   Principal Problem:   Acute respiratory failure with hypoxia [facility]) Active Problems:   Hypertensive emergency   Acute pulmonary edema (HCC)    Discharge Condition: Improved.  Diet recommendation: Low sodium, heart healthy.  Carbohydrate-modified.  Regular.  Wound care: None.  Code status: Full.   History of Present Illness:   Joseph Reese is a 55 y.o. male with history of hypertension, hyperlipidemia, CHF presents to the ER with sudden onset of shortness of breath when he was sleeping which woke him up from the sleep.  Denies any chest pain.  Patient states last few days he has been mildly short of breath.  Patient states he has history of hypertension which was diagnosed in 2019 and was prescribed medications which he has not taken for the last 3 months because he was not able to afford.  Admits to taking cocaine about 3 days ago drinks alcohol every day and smokes cigarettes.   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: -Outside record indicate prior echo in Jan 2021 showed EF 25-30% -Echo 05/06/2021 showed EF 15-20%, severe LV dysfunction, moderate LVH, (normal diastolic parameter?), mildly reduced RVSF, mild AI -Transitioned to lasix  PO daily -Continue coreg 6.25mg  BID (needs nonselective BB due to cocaine use) -Continue spiro  daily, continue losartan  daily -Goal eventually is to transition to entresto and start SGLT2i, however, patient does not have insurance at this time -TOC HF consulted and follow-up arranged; will try to get him on Medicaid -Encourage alcohol, drug and tobacco  cessation   #Hypertensive Emergency: Occurred in the setting of being off medications due to financial issues. Medications restarted inpatient    #Cocaine abuse/tobacco abuse/ETOH abuse: - Patient counseled on the effects of substances on the heart and encouraged to stop    #HLD: LDL 219 and HDL 55 on 12/24 - Increased lipitor to 80 mg daily    #Mildly elevated troponin - low/flat peak of 65 -outpatient cath with cards: If compliant as an out-patient, will need RHC/LHC at that time    Medical Consultants:   cards   Discharge Exam:   Vitals:   05/09/21 0433 05/09/21 0813  BP: (!) 131/98 113/64  Pulse: 70 87  Resp: 16   Temp: 98.3 F (36.8 C)   SpO2: 97%    Vitals:   05/08/21 1838 05/08/21 2128 05/09/21 0433 05/09/21 0813  BP: 92/80 (!) 124/98 (!) 131/98 113/64  Pulse: 85 81 70 87  Resp:  18 16   Temp:  98.5 F (36.9 C) 98.3 F (36.8 C)   TempSrc:  Oral Oral   SpO2:  95% 97%   Weight:   96.8 kg   Height:        General exam: Appears calm and comfortable.    The results of significant diagnostics from this hospitalization (including imaging, microbiology, ancillary and laboratory) are listed below for reference.     Procedures and Diagnostic Studies:   DG Chest Portable 1 View  Result Date: 05/06/2021 CLINICAL DATA:  Shortness of breath. EXAM: PORTABLE CHEST 1 VIEW COMPARISON:  April 11, 2021 FINDINGS: Mild, diffusely increased interstitial lung markings are noted. Mild to moderate severity areas  of atelectasis and/or infiltrate are seen within the bilateral lung bases. There is no evidence of a pleural effusion or pneumothorax. The cardiac silhouette is borderline in size and unchanged in appearance. The visualized skeletal structures are unremarkable. IMPRESSION: Mild interstitial edema with mild to moderate severity bibasilar atelectasis and/or infiltrate. Electronically Signed   By: Joseph Reese M.D.   On: 05/06/2021 02:44   ECHOCARDIOGRAM  COMPLETE  Result Date: 05/06/2021    ECHOCARDIOGRAM REPORT   Patient Name:   Joseph Reese Date of Exam: 05/06/2021 Medical Rec #:  [id number]   Height:       69.0 in Accession #:    [id number]  Weight:       216.0 lb Date of Birth:  Aug 02, 1965    BSA:          2.135 m Patient Age:    55 years    BP:           134/100 mmHg Patient Gender: M           HR:           84 bpm. Exam Location:  Inpatient Procedure: 2D Echo, Cardiac Doppler and Color Doppler Indications:    CHF  History:        Patient has no prior history of Echocardiogram examinations.                 CHF, Signs/Symptoms:Shortness of Breath; Risk Factors:Diabetes,                 Hypertension, Dyslipidemia and Current Smoker. Cocaine use.  Sonographer:    Joseph Reese RDCS (AE) Referring Phys: [id number] Joseph Reese Joseph Reese IMPRESSIONS  1. Left ventricular ejection fraction, by estimation, is 15-20%. The left ventricle has severely decreased function. The left ventricle demonstrates regional wall motion abnormalities (see scoring diagram/findings for description). There is moderate left ventricular hypertrophy. Left ventricular diastolic parameters were normal.  2. Right ventricular systolic function is mildly reduced. The right ventricular size is normal. Tricuspid regurgitation signal is inadequate for assessing PA pressure.  3. Left atrial size was mild to moderately dilated.  4. The mitral valve is grossly normal. Mild mitral valve regurgitation.  5. The aortic valve is tricuspid. There is mild calcification of the aortic valve. Aortic valve regurgitation is mild. No aortic stenosis is present. Aortic valve mean gradient measures 3.0 mmHg.  6. The inferior vena cava is normal in size with greater than 50% respiratory variability, suggesting right atrial pressure of 3 mmHg. Comparison(s): No prior Echocardiogram. FINDINGS  Left Ventricle: Left ventricular ejection fraction, by estimation, is 15-20%. The left ventricle has severely decreased function. The left  ventricle demonstrates regional wall motion abnormalities. The left ventricular internal cavity size was normal in  size. There is moderate left ventricular hypertrophy. Left ventricular diastolic parameters were normal.  LV Wall Scoring: The inferior wall, mid inferolateral segment, mid inferoseptal segment, and basal inferoseptal segment are akinetic. The entire anterior wall, antero-lateral wall, entire anterior septum, entire apex, and basal inferolateral segment are hypokinetic. Right Ventricle: The right ventricular size is normal. No increase in right ventricular wall thickness. Right ventricular systolic function is mildly reduced. Tricuspid regurgitation signal is inadequate for assessing PA pressure. Left Atrium: Left atrial size was mild to moderately dilated. Right Atrium: Right atrial size was normal in size. Pericardium: There is no evidence of pericardial effusion. Mitral Valve: The mitral valve is grossly normal. There is mild thickening of the mitral valve leaflet(s). Mild mitral valve regurgitation.  MV peak gradient, 3.0 mmHg. The mean mitral valve gradient is 2.0 mmHg. Tricuspid Valve: The tricuspid valve is grossly normal. Tricuspid valve regurgitation is trivial. Aortic Valve: The aortic valve is tricuspid. There is mild calcification of the aortic valve. There is mild aortic valve annular calcification. Aortic valve regurgitation is mild. No aortic stenosis is present. Aortic valve mean gradient measures 3.0 mmHg. Aortic valve peak gradient measures 5.5 mmHg. Aortic valve area, by VTI measures 4.18 cm. Pulmonic Valve: The pulmonic valve was grossly normal. Pulmonic valve regurgitation is trivial. Aorta: The aortic root is normal in size and structure. Venous: The inferior vena cava is normal in size with greater than 50% respiratory variability, suggesting right atrial pressure of 3 mmHg. IAS/Shunts: No atrial level shunt detected by color flow Doppler.  LEFT VENTRICLE PLAX 2D LVIDd:          5.60 cm   Diastology LVIDs:         5.10 cm   LV e' medial:  5.77 cm/s LV PW:         2.00 cm   LV e' lateral: 7.15 cm/s LV IVS:        1.60 cm LVOT diam:     2.60 cm LV SV:         75 LV SV Index:   35 LVOT Area:     5.31 cm  RIGHT VENTRICLE            IVC RV Basal diam:  3.50 cm    IVC diam: 1.90 cm RV S prime:     8.73 cm/s TAPSE (M-mode): 1.7 cm LEFT ATRIUM             Index        RIGHT ATRIUM           Index LA diam:        5.40 cm 2.53 cm/m   RA Area:     11.10 cm LA Vol (A2C):   99.0 ml 46.37 ml/m  RA Volume:   23.30 ml  10.91 ml/m LA Vol (A4C):   91.3 ml 42.77 ml/m LA Biplane Vol: 98.5 ml 46.14 ml/m  AORTIC VALVE AV Area (Vmax):    3.69 cm AV Area (Vmean):   3.26 cm AV Area (VTI):     4.18 cm AV Vmax:           117.00 cm/s AV Vmean:          84.200 cm/s AV VTI:            0.179 m AV Peak Grad:      5.5 mmHg AV Mean Grad:      3.0 mmHg LVOT Vmax:         81.30 cm/s LVOT Vmean:        51.700 cm/s LVOT VTI:          0.141 m LVOT/AV VTI ratio: 0.79  AORTA Ao Root diam: 3.60 cm Ao Asc diam:  3.50 cm MITRAL VALVE MV Area VTI:  3.39 cm   SHUNTS MV Peak grad: 3.0 mmHg   Systemic VTI:  0.14 m MV Mean grad: 2.0 mmHg   Systemic Diam: 2.60 cm MV Vmax:      0.87 m/s MV Vmean:     65.4 cm/s Nona Dell MD Electronically signed by Nona Dell MD Signature Date/Time: 05/06/2021/1:11:44 PM    Final      Labs:   Basic Metabolic Panel: Recent Labs  Lab 05/06/21 0233 05/06/21 0249  05/06/21 0535 05/06/21 0645 05/07/21 0334 05/08/21 0456 05/09/21 0320  NA 136 141 138  --  139 137 138  K 3.3* 3.2* 4.2  --  3.9 3.8 3.8  CL 106  --  107  --  105 103 102  CO2 19*  --  23  --  GLUCOSE 294*  --  97  --  115* 98 103*  BUN 11  --  12  --  CREATININE 1.23  --  1.00 1.05 0.96 0.80 0.93  CALCIUM 8.5*  --  8.8*  --  9.0 9.0 9.2  MG  --   --   --  2.1 1.9 1.8 2.1   GFR Estimated Creatinine Clearance: 100.5 mL/min (by C-G formula based on SCr of 0.93 mg/dL). Liver Function  Tests: Recent Labs  Lab 05/06/21 0233  AST 41  ALT 32  ALKPHOS 74  BILITOT 0.6  PROT 7.4  ALBUMIN 3.2*   No results for input(s): LIPASE, AMYLASE in the last 168 hours. No results for input(s): AMMONIA in the last 168 hours. Coagulation profile Recent Labs  Lab 05/06/21 0226  INR 1.0    CBC: Recent Labs  Lab 05/06/21 0226 05/06/21 0249 05/06/21 0645  WBC 6.9  --  7.4  NEUTROABS 2.9  --   --   HGB 14.0 14.6 14.7  HCT 44.7 43.0 45.4  MCV 85.6  --  84.1  PLT 266  --  267   Cardiac Enzymes: No results for input(s): CKTOTAL, CKMB, CKMBINDEX, TROPONINI in the last 168 hours. BNP: Invalid input(s): POCBNP CBG: No results for input(s): GLUCAP in the last 168 hours. D-Dimer No results for input(s): DDIMER in the last 72 hours. Hgb A1c No results for input(s): HGBA1C in the last 72 hours. Lipid Profile No results for input(s): CHOL, HDL, LDLCALC, TRIG, CHOLHDL, LDLDIRECT in the last 72 hours. Thyroid function studies No results for input(s): TSH, T4TOTAL, T3FREE, THYROIDAB in the last 72 hours.  Invalid input(s): FREET3 Anemia work up No results for input(s): VITAMINB12, FOLATE, FERRITIN, TIBC, IRON, RETICCTPCT in the last 72 hours. Microbiology Recent Results (from the past 240 hour(s))  Resp Panel by RT-PCR (Flu A&B, Covid) Nasopharyngeal Swab     Status: None   Collection Time: 05/06/21  2:27 AM   Specimen: Nasopharyngeal Swab; Nasopharyngeal(NP) swabs in vial transport medium  Result Value Ref Range Status   SARS Coronavirus 2 by RT PCR NEGATIVE NEGATIVE Final    Comment: (NOTE) SARS-CoV-2 target nucleic acids are NOT DETECTED.  The SARS-CoV-2 RNA is generally detectable in upper respiratory specimens during the acute phase of infection. The lowest concentration of SARS-CoV-2 viral copies this assay can detect is 138 copies/mL. A negative result does not preclude SARS-Cov-2 infection and should not be used as the sole basis for treatment or other patient  management decisions. A negative result may occur with  improper specimen collection/handling, submission of specimen other than nasopharyngeal swab, presence of viral mutation(s) within the areas targeted by this assay, and inadequate number of viral copies(<138 copies/mL). A negative result must be combined with clinical observations, patient history, and epidemiological information. The expected result is Negative.  Fact Sheet for Patients:  BloggerCourse.com  Fact Sheet for Healthcare Providers:  SeriousBroker.it  This test is no t yet approved or cleared by the [place] FDA and  has been authorized for detection and/or diagnosis of SARS-CoV-2 by FDA under an Emergency Use Authorization (EUA). This EUA will  remain  in effect (meaning this test can be used) for the duration of the COVID-19 declaration under Section 564(b)(1) of the Act, 21 U.S.C.section 360bbb-3(b)(1), unless the authorization is terminated  or revoked sooner.       Influenza A by PCR NEGATIVE NEGATIVE Final   Influenza B by PCR NEGATIVE NEGATIVE Final    Comment: (NOTE) The Xpert Xpress SARS-CoV-2/FLU/RSV plus assay is intended as an aid in the diagnosis of influenza from Nasopharyngeal swab specimens and should not be used as a sole basis for treatment. Nasal washings and aspirates are unacceptable for Xpert Xpress SARS-CoV-2/FLU/RSV testing.  Fact Sheet for Patients: BloggerCourse.com  Fact Sheet for Healthcare Providers: SeriousBroker.it  This test is not yet approved or cleared by the [place] FDA and has been authorized for detection and/or diagnosis of SARS-CoV-2 by FDA under an Emergency Use Authorization (EUA). This EUA will remain in effect (meaning this test can be used) for the duration of the COVID-19 declaration under Section 564(b)(1) of the Act, 21 U.S.C. section 360bbb-3(b)(1),  unless the authorization is terminated or revoked.  Performed at [facility], [address], [place], [place] [address]      Discharge Instructions:   Discharge Instructions     (HEART FAILURE PATIENTS) Call MD:  Anytime you have any of the following symptoms: 1) 3 pound weight gain in 24 hours or 5 pounds in 1 week 2) shortness of breath, with or without a dry hacking cough 3) swelling in the hands, feet or stomach 4) if you have to sleep on extra pillows at night in order to breathe.   Complete by: As directed    Diet - low sodium heart healthy   Complete by: As directed    Diet Carb Modified   Complete by: As directed    Discharge instructions   Complete by: As directed    Encourage alcohol, drug and tobacco cessation   Increase activity slowly   Complete by: As directed       Allergies as of 05/09/2021   No Known Allergies      Medication List     STOP taking these medications    amoxicillin-clavulanate 875-125 MG tablet Commonly known as: AUGMENTIN   HYDROcodone-acetaminophen 7.5-325 mg/15 ml solution Commonly known as: HYCET       TAKE these medications    atorvastatin 80 MG tablet Commonly known as: LIPITOR Take 1 tablet (80 mg total) by mouth daily. Start taking on: May 10, 2021 What changed:  medication strength how much to take   carvedilol 6.25 MG tablet Commonly known as: COREG Take 1 tablet (6.25 mg total) by mouth 2 (two) times daily with a meal. What changed: when to take this   furosemide 40 MG tablet Commonly known as: LASIX Take 1 tablet (40 mg total) by mouth daily. Start taking on: May 10, 2021   losartan 50 MG tablet Commonly known as: COZAAR Take 1 tablet (50 mg total) by mouth daily.   spironolactone 25 MG tablet Commonly known as: ALDACTONE Take 1 tablet (25 mg total) by mouth daily.        Follow-up Information     Anders Simmonds, PA-C Follow up.   Specialty: Family Medicine Why: TIME : 9:30  AM DATE: Barnetta Hammersmith [facility] Contact information: [address] [place] [address] [phone]                  Time coordinating discharge: 35 min  Signed:  Joseph Art DO  [facility] 05/09/2021, 1:31 PM

## 2021-05-09 NOTE — Discharge Instructions (Addendum)
A1c 6.4%- puts you in the "pre-diabetic" range so you need to start making dietary changes

## 2021-05-09 NOTE — TOC Transition Note (Addendum)
Transition of Care [facility]) - CM/SW Discharge Note   Patient Details  Name: Joshuah Minella MRN: [id number] Date of Birth: [birth year]  Transition of [facility]) CM/SW Contact:  Lawerance Sabal, RN Phone Number: 05/09/2021, 9:40 AM   Clinical Narrative:    Sherron Monday w patient at bedside. Confirmed uninsured w no PCP.  Requested CMA to schedule at a [facility]. Appointment on AVS Please send DC meds through [facility] pharmacy, and CM provided MATCH. Declined SA resources.      Barriers to Discharge: No Barriers Identified   Patient Goals and CMS Choice        Discharge Placement                       Discharge Plan and Services   Discharge Planning Services: CM Consult                                 Social Determinants of Health (SDOH) Interventions     Readmission Risk Interventions No flowsheet data found.

## 2021-05-11 ENCOUNTER — Ambulatory Visit: Payer: Medicaid Other | Attending: Physician Assistant | Admitting: Physician Assistant

## 2021-05-11 ENCOUNTER — Other Ambulatory Visit: Payer: Self-pay

## 2021-05-11 ENCOUNTER — Encounter: Payer: Self-pay | Admitting: Physician Assistant

## 2021-05-11 VITALS — BP 127/87 | HR 69 | Ht 69.0 in | Wt 217.2 lb

## 2021-05-11 DIAGNOSIS — I10 Essential (primary) hypertension: Secondary | ICD-10-CM | POA: Diagnosis not present

## 2021-05-11 DIAGNOSIS — R7303 Prediabetes: Secondary | ICD-10-CM

## 2021-05-11 DIAGNOSIS — F1910 Other psychoactive substance abuse, uncomplicated: Secondary | ICD-10-CM | POA: Diagnosis not present

## 2021-05-11 DIAGNOSIS — E785 Hyperlipidemia, unspecified: Secondary | ICD-10-CM | POA: Diagnosis not present

## 2021-05-11 DIAGNOSIS — Z91199 Patient's noncompliance with other medical treatment and regimen due to unspecified reason: Secondary | ICD-10-CM

## 2021-05-11 DIAGNOSIS — Z09 Encounter for follow-up examination after completed treatment for conditions other than malignant neoplasm: Secondary | ICD-10-CM

## 2021-05-11 MED ORDER — ATORVASTATIN CALCIUM 80 MG PO TABS
80.0000 mg | ORAL_TABLET | Freq: Every day | ORAL | 1 refills | Status: DC
  Filled 2021-05-11: qty 90, 90d supply, fill #0

## 2021-05-11 MED ORDER — CARVEDILOL 6.25 MG PO TABS
6.2500 mg | ORAL_TABLET | Freq: Two times a day (BID) | ORAL | 1 refills | Status: DC
  Filled 2021-05-11: qty 180, 90d supply, fill #0

## 2021-05-11 MED ORDER — FUROSEMIDE 40 MG PO TABS
40.0000 mg | ORAL_TABLET | Freq: Every day | ORAL | 1 refills | Status: DC
  Filled 2021-05-11: qty 90, 90d supply, fill #0

## 2021-05-11 MED ORDER — LOSARTAN POTASSIUM 50 MG PO TABS
50.0000 mg | ORAL_TABLET | Freq: Every day | ORAL | 1 refills | Status: DC
  Filled 2021-05-11: qty 90, 90d supply, fill #0

## 2021-05-11 MED ORDER — SPIRONOLACTONE 25 MG PO TABS
25.0000 mg | ORAL_TABLET | Freq: Every day | ORAL | 1 refills | Status: DC
  Filled 2021-05-11: qty 90, 90d supply, fill #0

## 2021-05-11 MED ORDER — METFORMIN HCL 500 MG PO TABS
500.0000 mg | ORAL_TABLET | Freq: Two times a day (BID) | ORAL | 3 refills | Status: DC
  Filled 2021-05-11: qty 60, 30d supply, fill #0

## 2021-05-11 NOTE — Progress Notes (Signed)
Patient ID: Joseph Reese, male   DOB: [birth year], 54 y.o.   MRN: [id number]    Junious Ragone, is a 55 y.o. male  LOV:[id number]  OAC:[id number]  DOB - [birth year]  Chief Complaint  Patient presents with   Hospitalization Follow-up       Subjective:   Joseph Reese is a 55 y.o. male here today for a follow up visit and to establish care After hospitalization 12/24-12/27/2022 for SOB.  He has a long history of noncompliance and not being able to afford his medications.  He is feeling great.  No SOB or CP.  He has an appt with cardiology/TOC 05/19/2020.  He is taking his meds.  He had used cocaine bc he was around friends that had some but he says this is not something he does on a regular basis.   He has a very strong FH of diabetes and wishes to start meds    From discharge summary(which is incomplete at the time I am seeing the patient.) as well as other hospital notes pieced together:  Principal Problem:   Acute respiratory failure with hypoxia (HCC) Active Problems:   Hypertensive emergency   Acute pulmonary edema (HCC)  Brief Narrative:  Joseph Reese is a 55 y.o. male with history of hypertension, hyperlipidemia, CHF presents to the ER with sudden onset of shortness of breath when he was sleeping which woke him up from the sleep.  Denies any chest pain.  Patient states last few days he has been mildly short of breath.  Patient states he has history of hypertension which was diagnosed in 2019 and was prescribed medications which he has not taken for the last 3 months because he was not able to afford.  Admits to taking cocaine about 3 days ago drinks alcohol every day and smokes cigarettes. Patient has been diuresed and now there are talks for possible cath-- inpt vs outpatient   Assessment & Plan:   Acute hypoxemic respiratory failure in the setting of flash pulmonary edema and hypertensive emergency: Improving -Initially requiring BiPAP, now on room air -Chest x-ray suggestive of flash  pulmonary edema with severely elevated blood pressure -troponin.-58--> 32--> 54--> 65.  BNP: 571.  TSH: WNL -Transthoracic echo shows severe LV dysfunction with ejection fraction of 15 to 20%, global hypokinesis with akinesis of the mid to basal inferior septal wall, mildly reduced RV contraction as well. -transition to lasix.  Continue Coreg.  Added Aldactone 25 mg daily-Appreciate cardiology recommendations-plan is right and left heart catheterization (can be done outpatient) vs inpatient   Hypertensive emergency: Improving -Secondary to noncompliance with medications. -Continue ARB, Coreg, Aldactone and IV hydralazine as needed. -Monitor closely.   Substance abuse disorder: -History of cocaine abuse, tobacco abuse and alcohol abuse -Urine drug is positive for cocaine. -On CIWA protocol.  Continue multivitamin, thiamine and folic acid -Recommend cessation.   Hypokalemia/hypomagnesemia: Replenished.   -Goal potassium more than 4, magnesium more than 2   Prediabetes: A1c 6.4% -Monitor blood sugar closely. -carb mod diet   Hyperlipidemia: Elevated total cholesterol and LDL -Continue atorvastatin 80 mg once daily.   . Patient has No headache, No chest pain, No abdominal pain - No Nausea, No new weakness tingling or numbness, No Cough - SOB.  No problems updated.  ALLERGIES: No Known Allergies  PAST MEDICAL HISTORY: Past Medical History:  Diagnosis Date   Diabetes mellitus without complication (HCC)    Hypertension     MEDICATIONS AT HOME: Prior to Admission medications   Medication Sig  Start Date End Date Taking? Authorizing Provider  metFORMIN (GLUCOPHAGE) 500 MG tablet Take 1 tablet (500 mg total) by mouth 2 (two) times daily with a meal. 05/11/21  Yes Hailly Fess M, PA-C  atorvastatin (LIPITOR) 80 MG tablet Take 1 tablet (80 mg total) by mouth daily. 05/11/21   Anders Simmonds, PA-C  carvedilol (COREG) 6.25 MG tablet Take 1 tablet (6.25 mg total) by mouth 2 (two)  times daily with a meal. 05/11/21   Carmelia Tiner, Marzella Schlein, PA-C  furosemide (LASIX) 40 MG tablet Take 1 tablet (40 mg total) by mouth daily. 05/11/21   Anders Simmonds, PA-C  losartan (COZAAR) 50 MG tablet Take 1 tablet (50 mg total) by mouth daily. 05/11/21   Anders Simmonds, PA-C  spironolactone (ALDACTONE) 25 MG tablet Take 1 tablet (25 mg total) by mouth daily. 05/11/21   Cashlynn Yearwood, Marzella Schlein, PA-C    ROS: Neg HEENT Neg resp Neg cardiac Neg GI Neg GU Neg MS Neg psych Neg neuro  Objective:   Vitals:   05/11/21 0913  BP: 127/87  Pulse: 69  SpO2: 97%  Weight: 217 lb 4 oz (98.5 kg)  Height: 5\' 9"  (1.753 m)   Exam General appearance : Awake, alert, not in any distress. Speech Clear. Not toxic looking HEENT: Atraumatic and Normocephalic Neck: Supple, no JVD. No cervical lymphadenopathy.  Chest: Good air entry bilaterally, CTAB.  No rales/rhonchi/wheezing CVS: S1 S2 regular, no murmurs.  Extremities: B/L Lower Ext shows no edema, both legs are warm to touch Neurology: Awake alert, and oriented X 3, CN II-XII intact, Non focal Skin: No Rash  Data Review Lab Results  Component Value Date   HGBA1C 6.4 (H) 05/06/2021    Assessment & Plan   1. Hypertension, unspecified type Controlled-keep appt 1/6 with TOC cardiology - carvedilol (COREG) 6.25 MG tablet; Take 1 tablet (6.25 mg total) by mouth 2 (two) times daily with a meal.  Dispense: 180 tablet; Refill: 1 - furosemide (LASIX) 40 MG tablet; Take 1 tablet (40 mg total) by mouth daily.  Dispense: 90 tablet; Refill: 1 - losartan (COZAAR) 50 MG tablet; Take 1 tablet (50 mg total) by mouth daily.  Dispense: 90 tablet; Refill: 1 - spironolactone (ALDACTONE) 25 MG tablet; Take 1 tablet (25 mg total) by mouth daily.  Dispense: 90 tablet; Refill: 1  2. Substance abuse (HCC) 05/08/2021.org is the website for narcotics anonymous  Charlotta Newton (website) or 331-275-0136 is the information for alcoholics anonymous  Both are free and  immediately available for help with alcohol and drug use I have counseled the patient at length about substance abuse and addiction.  12 step meetings/recovery recommended.  Local 12 step meeting lists were given and attendance was encouraged.  Patient expresses understanding.     3. Prediabetes I have had a lengthy discussion and provided education about insulin resistance and the intake of too much sugar/refined carbohydrates.  I have advised the patient to work at a goal of eliminating sugary drinks, candy, desserts, sweets, refined sugars, processed foods, and white carbohydrates.  The patient expresses understanding.  - metFORMIN (GLUCOPHAGE) 500 MG tablet; Take 1 tablet (500 mg total) by mouth 2 (two) times daily with a meal.  Dispense: 180 tablet; Refill: 3  4. Hyperlipidemia, unspecified hyperlipidemia type - atorvastatin (LIPITOR) 80 MG tablet; Take 1 tablet (80 mg total) by mouth daily.  Dispense: 90 tablet; Refill: 1  5. Hospital discharge follow-up Much improved    Patient have been counseled extensively about nutrition and  exercise. Other issues discussed during this visit include: low cholesterol diet, weight control and daily exercise, foot care, annual eye examinations at Ophthalmology, importance of adherence with medications and regular follow-up. We also discussed long term complications of uncontrolled diabetes and hypertension.   Return in about 3 months (around 08/09/2021) for assign PCP for chronic dz management.  The patient was given clear instructions to go to ER or return to medical center if symptoms don't improve, worsen or new problems develop. The patient verbalized understanding. The patient was told to call to get lab results if they haven't heard anything in the next week.      Georgian Co, PA-C [facility] [place], [place] [phone]   05/11/2021, 9:40 AM

## 2021-05-11 NOTE — Patient Instructions (Signed)
Keep appt with cardiology next week

## 2021-05-15 ENCOUNTER — Other Ambulatory Visit: Payer: Self-pay

## 2021-05-16 ENCOUNTER — Other Ambulatory Visit (HOSPITAL_COMMUNITY): Payer: Self-pay

## 2021-05-16 ENCOUNTER — Other Ambulatory Visit: Payer: Self-pay

## 2021-05-18 ENCOUNTER — Telehealth (HOSPITAL_COMMUNITY): Payer: Self-pay

## 2021-05-18 NOTE — Progress Notes (Addendum)
HEART & VASCULAR TRANSITION OF CARE CONSULT NOTE     Referring Physician: Dr. Shari Prows  Primary Care: [facility] Primary Cardiologist: Maisie Fus, MD (new, recent admit)    HPI: Referred to clinic by Dr. Shari Prows for heart failure consultation.   56 y/o male w/ h/o chronic systolic heart failure, first diagnosed in [place], h/o poor compliance, HTN, cocaine, ETOH and tobacco abuse. Uninsured.   Recently admitted to [facility] 12/22 w/ acute on chronic HF. BP was markedly elevated on admit c/w hypertensive emergency. USD + for cocaine. HS trop 58>>32>>54>>65. Echo EF 15-20%, RV mildly reduced. Per chart, records from OSH showed EF previously 25-30%.   He was diuresed and placed on GDMT. Given concern for noncompliance, RHC/LHC deferred to the out-patient setting to ensure he is following up regularly and is compliant with medications as would need to be compliant with DAPT if PCI needed/performed. Referred to [facility] clinic. D/c Wt 213 lb.   Presents today for post hospital f/u and HF assessment. Reports feeling well. No resting dyspnea, orthopnea/ PND. Denies LEE. Wt up 8 lb, at 221 lb but he attributes some of this to his clothing/boots. Euvolemic on exam. BP well controlled today, 124/88. Reports compliance w/ meds. No side effects. Denies CP. Denies any further use of cocaine post discharge. Still smoking cigarettes. Still drinks beer but reports he has cut back significantly. > 5 years ago it was not uncommon for him to consume a 12 pack of beer in a day. Now only drinks ~6 pack on weekends. Not daily. He also reports strong family h/o CAD. 3 brothers died from MIs, ages 65, 22 and 79 y/o.     Cardiac Testing  Echo 05/06/2021 IMPRESSIONS   1. Left ventricular ejection fraction, by estimation, is 15-20%. The left  ventricle has severely decreased function. The left ventricle demonstrates  regional wall motion abnormalities (see scoring diagram/findings for  description). There is  moderate  left ventricular hypertrophy. Left ventricular diastolic parameters were  normal.   2. Right ventricular systolic function is mildly reduced. The right  ventricular size is normal. Tricuspid regurgitation signal is inadequate  for assessing PA pressure.   3. Left atrial size was mild to moderately dilated.   4. The mitral valve is grossly normal. Mild mitral valve regurgitation.   5. The aortic valve is tricuspid. There is mild calcification of the  aortic valve. Aortic valve regurgitation is mild. No aortic stenosis is  present. Aortic valve mean gradient measures 3.0 mmHg.   6. The inferior vena cava is normal in size with greater than 50%  respiratory variability, suggesting right atrial pressure of 3 mmHg.   Comparison(s): No prior Echocardiogram.  Review of Systems: [y] = yes, [ ]  = no   General: Weight gain [ Y]; Weight loss [ ] ; Anorexia [ ] ; Fatigue [ ] ; Fever [ ] ; Chills [ ] ; Weakness [ ]   Cardiac: Chest pain/pressure [ ] ; Resting SOB [ ] ; Exertional SOB [ Y]; Orthopnea [ ] ; Pedal Edema [ ] ; Palpitations [ ] ; Syncope [ ] ; Presyncope [ ] ; Paroxysmal nocturnal dyspnea[ ]   Pulmonary: Cough [ ] ; Wheezing[ ] ; Hemoptysis[ ] ; Sputum [ ] ; Snoring [ ]   GI: Vomiting[ ] ; Dysphagia[ ] ; Melena[ ] ; Hematochezia [ ] ; Heartburn[ ] ; Abdominal pain [ ] ; Constipation [ ] ; Diarrhea [ ] ; BRBPR [ ]   GU: Hematuria[ ] ; Dysuria [ ] ; Nocturia[ ]   Vascular: Pain in legs with walking [ ] ; Pain in feet with lying flat [ ] ;  Non-healing sores [ ] ; Stroke [ ] ; TIA [ ] ; Slurred speech [ ] ;  Neuro: Headaches[ ] ; Vertigo[ ] ; Seizures[ ] ; Paresthesias[ ] ;Blurred vision [ ] ; Diplopia [ ] ; Vision changes [ ]   Ortho/Skin: Arthritis [ ] ; Joint pain [ ] ; Muscle pain [ ] ; Joint swelling [ ] ; Back Pain [ ] ; Rash [ ]   Psych: Depression[ ] ; Anxiety[ ]   Heme: Bleeding problems [ ] ; Clotting disorders [ ] ; Anemia [ ]   Endocrine: Diabetes [ ] ; Thyroid dysfunction[ ]    Past Medical History:  Diagnosis Date   CHF  (congestive heart failure), NYHA class II (HCC)    Cocaine abuse ([place])    Diabetes mellitus without complication (HCC)    ETOH abuse    Hypertension    Tobacco abuse     Current Outpatient Medications  Medication Sig Dispense Refill   atorvastatin (LIPITOR) 80 MG tablet Take 1 tablet (80 mg total) by mouth daily. 90 tablet 1   carvedilol (COREG) 6.25 MG tablet Take 1 tablet (6.25 mg total) by mouth 2 (two) times daily with a meal. 180 tablet 1   furosemide (LASIX) 40 MG tablet Take 1 tablet (40 mg total) by mouth daily. 90 tablet 1   metFORMIN (GLUCOPHAGE) 500 MG tablet Take 1 tablet (500 mg total) by mouth 2 (two) times daily with a meal. 180 tablet 3   spironolactone (ALDACTONE) 25 MG tablet Take 1 tablet (25 mg total) by mouth daily. 90 tablet 1   sacubitril-valsartan (ENTRESTO) 49-51 MG Take 1 tablet by mouth 2 (two) times daily. 60 tablet 11   No current facility-administered medications for this encounter.    No Known Allergies    Social History   Socioeconomic History   Marital status: Single    Spouse name: Not on file   Number of children: 4   Years of education: Not on file   Highest education level: High school graduate  Occupational History   Occupation: unemployed    Comment: contract roofer--unable to work since 12/2020, d/t physical  Tobacco Use   Smoking status: Every Day    Packs/day: 0.50    Years: 40.00    Pack years: 20.00    Types: Cigarettes   Smokeless tobacco: Never  Vaping Use   Vaping Use: Never used  Substance and Sexual Activity   Alcohol use: Yes    Alcohol/week: 18.0 standard drinks    Types: 12 Cans of beer, 6 Shots of liquor per week    Comment: 40 years of consistent   Drug use: Yes    Frequency: 1.0 times per week    Types: Cocaine    Comment: last use 12/21, 3-4x/mo   Sexual activity: Not on file  Other Topics Concern   Not on file  Social History Narrative   Not on file   Social Determinants of Health   Financial Resource  Strain: High Risk   Difficulty of Paying Living Expenses: Very hard  Food Insecurity: No Food Insecurity   Worried About Charity fundraiser in the Last Year: Never true   Ran Out of Food in the Last Year: Never true  Transportation Needs: Unmet Transportation Needs   Lack of Transportation (Medical): Yes   Lack of Transportation (Non-Medical): Yes  Physical Activity: Not on file  Stress: Not on file  Social Connections: Not on file  Intimate Partner Violence: Not on file      Family History  Problem Relation Age of Onset   Diabetes Mellitus II Brother  Coronary artery disease Brother 67       MI   Coronary artery disease Brother 68       MI   Coronary artery disease Brother 66       MI    Vitals:   05/19/21 0946  BP: 124/88  Pulse: 75  SpO2: 98%  Weight: 100.3 kg (221 lb 3.2 oz)    PHYSICAL EXAM: General:  Well appearing. No respiratory difficulty HEENT: normal Neck: supple. no JVD. Carotids 2+ bilat; no bruits. No lymphadenopathy or thryomegaly appreciated. Cor: PMI nondisplaced. Regular rate & rhythm. No rubs, gallops or murmurs. Lungs: clear Abdomen: soft, nontender, nondistended. No hepatosplenomegaly. No bruits or masses. Good bowel sounds. Extremities: no cyanosis, clubbing, rash, edema Neuro: alert & oriented x 3, cranial nerves grossly intact. moves all 4 extremities w/o difficulty. Affect pleasant.  ECG: not performed    ASSESSMENT & PLAN:  Chronic Systolic Heart Failure - Echo 12/22 EF 15-20%, RV mildly reduced (no prior study for comparison, though records report prior EF ~25-30% at OSH)  - possible etiologies include ischemic HD vs HTN CM, vs Drug induced (ETOH CM +/- cocaine) - no h/o CP but needs diagnostic R/LHC (multiple RFs for CAD)  - cMRI if cath unrevealing - tight BP control and absence from cocaine and ETOH imperative  - Continue GDMT titration  - Stop Losartan 50  - Start Entresto 49-51 mg bid - Continue Spiro 25 mg daily  -  Continue Coreg 6.25 mg bid  - Continue Lasix 40 mg daily  - Check BMP today and again in 7 days  - Add SGLT2i next visit (Hgb A1c 6.4, GFR >60) - Refer to the [facility] for further management   2. Hypertension  - h/o poor control, now improved w/ improved med compliance  - GDMT per above  - reports h/o snoring. Needs sleep study once he has insurance  - BMP today   3. HLD - recent LP 12/22 w/ elevated LDL 219 mg/dL. TG ok 111 mg/dL - now on statin, atrova 80 mg daily  - needs repeat FLP and HFTs in 4-6 weeks   4. Polysubstance Abuse - + history of cocaine, tobacco and ETOH use - UDS 12/22 + for cocaine. Denies further use post d/c  - absence from the above substances imperative given CV disease  - offered referral to concealing but he declined   5. SDOH - consult SW to help w/ insurance (has medicaid paperwork)  - HF fund for meds    NYHA II GDMT  Diuretic- Lasix 40 mg daily  BB- Coreg 6.25 mg bid  Ace/ARB/ARNI Entresto 49-51 mg bid  MRA Spiro 25 mg daily  SGLT2i -add next visit    Referred to HFSW (PCP, Medications, Transportation, ETOH Abuse, Drug Abuse, Insurance, Museum/gallery curator ): Yes  Refer to Pharmacy:  No Refer to Home Health:  No Refer to Advanced Heart Failure Clinic: Yes  Refer to General Cardiology:  No  Follow up in the [facility] for further management . Assign to Dr. Haroldine Laws

## 2021-05-18 NOTE — Telephone Encounter (Signed)
Call attempted to confirm HV TOC appt 1/6 at 10AM. HIPPA appropriate VM left with callback number.   Ozella Rocks, MSN, RN Heart Failure Nurse Navigator [phone]

## 2021-05-19 ENCOUNTER — Telehealth (HOSPITAL_COMMUNITY): Payer: Self-pay | Admitting: Pharmacy Technician

## 2021-05-19 ENCOUNTER — Encounter (HOSPITAL_COMMUNITY): Payer: Self-pay

## 2021-05-19 ENCOUNTER — Other Ambulatory Visit (HOSPITAL_COMMUNITY): Payer: Self-pay

## 2021-05-19 ENCOUNTER — Other Ambulatory Visit: Payer: Self-pay

## 2021-05-19 ENCOUNTER — Ambulatory Visit (HOSPITAL_COMMUNITY)
Admit: 2021-05-19 | Discharge: 2021-05-19 | Disposition: A | Discharge status: Home / Self Care | Payer: Medicaid Other | Attending: Internal Medicine | Admitting: Internal Medicine

## 2021-05-19 VITALS — BP 124/88 | HR 75 | Wt 221.2 lb

## 2021-05-19 DIAGNOSIS — I10 Essential (primary) hypertension: Secondary | ICD-10-CM

## 2021-05-19 DIAGNOSIS — Z560 Unemployment, unspecified: Secondary | ICD-10-CM | POA: Diagnosis not present

## 2021-05-19 DIAGNOSIS — Z5982 Transportation insecurity: Secondary | ICD-10-CM | POA: Insufficient documentation

## 2021-05-19 DIAGNOSIS — I082 Rheumatic disorders of both aortic and tricuspid valves: Secondary | ICD-10-CM | POA: Insufficient documentation

## 2021-05-19 DIAGNOSIS — Z79899 Other long term (current) drug therapy: Secondary | ICD-10-CM | POA: Insufficient documentation

## 2021-05-19 DIAGNOSIS — I110 Hypertensive heart disease with heart failure: Secondary | ICD-10-CM | POA: Insufficient documentation

## 2021-05-19 DIAGNOSIS — F1090 Alcohol use, unspecified, uncomplicated: Secondary | ICD-10-CM | POA: Insufficient documentation

## 2021-05-19 DIAGNOSIS — Z8249 Family history of ischemic heart disease and other diseases of the circulatory system: Secondary | ICD-10-CM | POA: Insufficient documentation

## 2021-05-19 DIAGNOSIS — Z596 Low income: Secondary | ICD-10-CM | POA: Insufficient documentation

## 2021-05-19 DIAGNOSIS — E785 Hyperlipidemia, unspecified: Secondary | ICD-10-CM | POA: Diagnosis not present

## 2021-05-19 DIAGNOSIS — I5022 Chronic systolic (congestive) heart failure: Secondary | ICD-10-CM | POA: Diagnosis not present

## 2021-05-19 DIAGNOSIS — Z5986 Financial insecurity: Secondary | ICD-10-CM | POA: Insufficient documentation

## 2021-05-19 DIAGNOSIS — Z597 Insufficient social insurance and welfare support: Secondary | ICD-10-CM | POA: Diagnosis not present

## 2021-05-19 DIAGNOSIS — F1411 Cocaine abuse, in remission: Secondary | ICD-10-CM | POA: Insufficient documentation

## 2021-05-19 DIAGNOSIS — Z7984 Long term (current) use of oral hypoglycemic drugs: Secondary | ICD-10-CM | POA: Insufficient documentation

## 2021-05-19 DIAGNOSIS — F17210 Nicotine dependence, cigarettes, uncomplicated: Secondary | ICD-10-CM | POA: Diagnosis not present

## 2021-05-19 HISTORY — DX: Heart failure, unspecified: I50.9

## 2021-05-19 HISTORY — DX: Cocaine abuse, uncomplicated: F14.10

## 2021-05-19 HISTORY — DX: Alcohol abuse, uncomplicated: F10.10

## 2021-05-19 HISTORY — DX: Tobacco use: Z72.0

## 2021-05-19 LAB — BASIC METABOLIC PANEL
Anion gap: 7 (ref 5–15)
BUN: 8 mg/dL (ref 6–20)
CO2: 27 mmol/L (ref 22–32)
Calcium: 9.2 mg/dL (ref 8.9–10.3)
Chloride: 104 mmol/L (ref 98–111)
Creatinine, Ser: 0.84 mg/dL (ref 0.61–1.24)
GFR, Estimated: >60 mL/min (ref >60)
Glucose, Bld: 104 mg/dL — ABNORMAL HIGH (ref 70–99)
Potassium: 3.8 mmol/L (ref 3.5–5.1)
Sodium: 138 mmol/L (ref 135–145)

## 2021-05-19 MED ORDER — ENTRESTO 49-51 MG PO TABS: 1.0000 | ORAL_TABLET | Freq: Two times a day (BID) | ORAL | 3 refills | Status: DC

## 2021-05-19 MED ORDER — ENTRESTO 49-51 MG PO TABS
1.0000 | ORAL_TABLET | Freq: Two times a day (BID) | ORAL | 11 refills | Status: DC
  Filled 2021-05-19: qty 60, 30d supply, fill #0

## 2021-05-19 NOTE — Telephone Encounter (Signed)
Advanced Heart Failure Patient Advocate Encounter  Patient was seen in clinic today and started on Entresto.   The patient is currently uninsured and has no income. Started an Land for [employer]. Will most likely be denied as we do not have any proof of income to send in. Patient will have to follow up with [employer] once denial is obtained.   Will fax in once signatures are obtained.

## 2021-05-19 NOTE — Addendum Note (Signed)
Encounter addended by: Chinita Pester, CMA on: 05/19/2021 4:35 PM  Actions taken: Order list changed

## 2021-05-19 NOTE — Addendum Note (Signed)
Encounter addended by: Allayne Butcher, PA-C on: 05/19/2021 12:03 PM  Actions taken: Clinical Note Signed

## 2021-05-19 NOTE — Patient Instructions (Addendum)
Labs done today. We will contact you only if your labs are abnormal.  STOP taking Losartan  START Entresto 49-51mg  (1 tablet) by mouth 2 times daily.   No other medication changes were made. Please continue all current medications as prescribed.  Your physician recommends that you schedule a follow-up appointment in: 2-3 weeks with our NP/PA Clinic here in our office.   If you have any questions or concerns before your next appointment please send [place] a message through [place] or call our office at [phone].    TO LEAVE A MESSAGE FOR THE NURSE SELECT OPTION 2, PLEASE LEAVE A MESSAGE INCLUDING: YOUR NAME DATE OF BIRTH CALL BACK NUMBER REASON FOR CALL**this is important as we prioritize the call backs  YOU WILL RECEIVE A CALL BACK THE SAME DAY AS LONG AS YOU CALL BEFORE 4:00 PM   Do the following things EVERYDAY: Weigh yourself in the morning before breakfast. Write it down and keep it in a log. Take your medicines as prescribed Eat low salt foods--Limit salt (sodium) to 2000 mg per day.  Stay as active as you can everyday Limit all fluids for the day to less than 2 liters   At the [facility], you and your health needs are our priority. As part of our continuing mission to provide you with exceptional heart care, we have created designated Provider Care Teams. These Care Teams include your primary Cardiologist (physician) and Advanced Practice Providers (APPs- Physician Assistants and Nurse Practitioners) who all work together to provide you with the care you need, when you need it.   You may see any of the following providers on your designated Care Team at your next follow up: Dr Glori Bickers Dr Haynes Kerns, NP Lyda Jester, [place] Audry Riles, PharmD   Please be sure to bring in all your medications bottles to every appointment.

## 2021-05-26 ENCOUNTER — Ambulatory Visit (HOSPITAL_COMMUNITY)
Admission: RE | Admit: 2021-05-26 | Discharge: 2021-05-26 | Disposition: A | Discharge status: Home / Self Care | Payer: Medicaid Other | Source: Ambulatory Visit | Attending: Internal Medicine | Admitting: Internal Medicine

## 2021-05-26 ENCOUNTER — Other Ambulatory Visit: Payer: Self-pay

## 2021-05-26 DIAGNOSIS — I5022 Chronic systolic (congestive) heart failure: Secondary | ICD-10-CM | POA: Diagnosis not present

## 2021-05-26 LAB — BASIC METABOLIC PANEL
Anion gap: 8 (ref 5–15)
BUN: 10 mg/dL (ref 6–20)
CO2: 25 mmol/L (ref 22–32)
Calcium: 9.4 mg/dL (ref 8.9–10.3)
Chloride: 105 mmol/L (ref 98–111)
Creatinine, Ser: 0.8 mg/dL (ref 0.61–1.24)
GFR, Estimated: >60 mL/min (ref >60)
Glucose, Bld: 110 mg/dL — ABNORMAL HIGH (ref 70–99)
Potassium: 3.8 mmol/L (ref 3.5–5.1)
Sodium: 138 mmol/L (ref 135–145)

## 2021-05-26 NOTE — Addendum Note (Signed)
Encounter addended by: Faythe Casa, CMA on: 05/26/2021 9:50 AM  Actions taken: Charge Capture section accepted

## 2021-05-31 NOTE — Progress Notes (Signed)
ADVANCED HF CLINIC CONSULT NOTE  Referring Physician: Ellen Henri, PA Primary Care: [place] [facility] Cardiologist: Dr. Haroldine Laws  HPI: Joseph Reese is a 56 y.o. male w/ h/o chronic systolic heart failure, first diagnosed in [place], h/o poor compliance, HTN, cocaine, ETOH and tobacco abuse. Uninsured.    Admitted to [facility] 12/22 w/ acute on chronic HF. BP was markedly elevated on admit c/w hypertensive emergency. USD + for cocaine. HS trop 58>>32>>54>>65. Echo EF 15-20%, RV mildly reduced. Per chart, records from OSH showed EF previously 25-30%. He was diuresed and placed on GDMT. Given concern for noncompliance, RHC/LHC deferred to the out-patient setting to ensure he is following up regularly and is compliant with medications, as would need to be compliant with DAPT if PCI needed/performed. Referred to [facility] clinic. D/c Wt 213 lb.   Seen in [facility] 05/19/21 no further drug use and cut down on ETOH. Strong CAD family history, 3 brothers died from MIs, ages 70, 63 and 58 y/o. Entresto started.   Today he returns for HF follow up. He has mild SOB with stairs but does ok on flat ground. Overall feeling fine. Denies abnormal bleeding, palpitations, CP, dizziness, edema, or PND/Orthopnea. Appetite ok. No fever or chills. He does not weigh at home but feels he is lowing weight. Taking all medications. Smokes 4-5 cigs/day, ETOH 2 beers on weekend, last cocaine use 1 month. He does not drive, he does not work. He has 4 grown daughters and grandchildren.      Cardiac Testing  - Echo 05/06/2021 EF: 15-20%, severe LV dysfunction, moderate LVH, RV mildly down.  Review of Systems: [y] = yes, [ ]  = no   General: Weight gain [ ] ; Weight loss [ ] ; Anorexia [ ] ; Fatigue [ ] ; Fever [ ] ; Chills [ ] ; Weakness [ ]   Cardiac: Chest pain/pressure [ ] ; Resting SOB [ ] ; Exertional SOB [ y]; Orthopnea [ ] ; Pedal Edema [ ] ; Palpitations [ ] ; Syncope [ ] ; Presyncope [ ] ; Paroxysmal nocturnal dyspnea[ ]   Pulmonary: Cough  [name] ]; Wheezing[ ] ; Hemoptysis[ ] ; Sputum [ ] ; Snoring [ ]   GI: Vomiting[ ] ; Dysphagia[ ] ; Melena[ ] ; Hematochezia [ ] ; Heartburn[ ] ; Abdominal pain [ ] ; Constipation [ ] ; Diarrhea [ ] ; BRBPR [ ]   GU: Hematuria[ ] ; Dysuria [ ] ; Nocturia[ ]   Vascular: Pain in legs with walking [ ] ; Pain in feet with lying flat [ ] ; Non-healing sores [ ] ; Stroke [ ] ; TIA [ ] ; Slurred speech [ ] ;  Neuro: Headaches[ ] ; Vertigo[ ] ; Seizures[ ] ; Paresthesias[ ] ;Blurred vision [ ] ; Diplopia [ ] ; Vision changes [ ]   Ortho/Skin: Arthritis [ ] ; Joint pain [ ] ; Muscle pain [ ] ; Joint swelling [ ] ; Back Pain [ ] ; Rash [ ]   Psych: Depression[ ] ; Anxiety[ ]   Heme: Bleeding problems [ ] ; Clotting disorders [ ] ; Anemia [ ]   Endocrine: Diabetes [ ] ; Thyroid dysfunction[ ]   Past Medical History:  Diagnosis Date   CHF (congestive heart failure), NYHA class II (HCC)    Cocaine abuse ([place])    Diabetes mellitus without complication (HCC)    ETOH abuse    Hypertension    Tobacco abuse    Current Outpatient Medications  Medication Sig Dispense Refill   atorvastatin (LIPITOR) 80 MG tablet Take 1 tablet (80 mg total) by mouth daily. 90 tablet 1   carvedilol (COREG) 6.25 MG tablet Take 1 tablet (6.25 mg total) by mouth 2 (two) times daily with a meal. 180 tablet  1   furosemide (LASIX) 40 MG tablet Take 1 tablet (40 mg total) by mouth daily. 90 tablet 1   metFORMIN (GLUCOPHAGE) 500 MG tablet Take 1 tablet (500 mg total) by mouth 2 (two) times daily with a meal. 180 tablet 3   sacubitril-valsartan (ENTRESTO) 49-51 MG Take 1 tablet by mouth 2 (two) times daily. 60 tablet 11   sacubitril-valsartan (ENTRESTO) 49-51 MG Take 1 tablet by mouth 2 (two) times daily. 180 tablet 3   spironolactone (ALDACTONE) 25 MG tablet Take 1 tablet (25 mg total) by mouth daily. 90 tablet 1   No current facility-administered medications for this encounter.   No Known Allergies  Social History   Socioeconomic History   Marital status: Single     Spouse name: Not on file   Number of children: 4   Years of education: Not on file   Highest education level: High school graduate  Occupational History   Occupation: unemployed    Comment: contract roofer--unable to work since 12/2020, d/t physical  Tobacco Use   Smoking status: Every Day    Packs/day: 0.50    Years: 40.00    Pack years: 20.00    Types: Cigarettes   Smokeless tobacco: Never  Vaping Use   Vaping Use: Never used  Substance and Sexual Activity   Alcohol use: Yes    Alcohol/week: 18.0 standard drinks    Types: 12 Cans of beer, 6 Shots of liquor per week    Comment: 40 years of consistent   Drug use: Yes    Frequency: 1.0 times per week    Types: Cocaine    Comment: last use 12/21, 3-4x/mo   Sexual activity: Not on file  Other Topics Concern   Not on file  Social History Narrative   Not on file   Social Determinants of Health   Financial Resource Strain: High Risk   Difficulty of Paying Living Expenses: Very hard  Food Insecurity: No Food Insecurity   Worried About Charity fundraiser in the Last Year: Never true   Arboriculturist in the Last Year: Never true  Transportation Needs: Unmet Transportation Needs   Lack of Transportation (Medical): Yes   Lack of Transportation (Non-Medical): Yes  Physical Activity: Not on file  Stress: Not on file  Social Connections: Not on file  Intimate Partner Violence: Not on file   Family History  Problem Relation Age of Onset   Diabetes Mellitus II Brother    Coronary artery disease Brother 44       MI   Coronary artery disease Brother 62       MI   Coronary artery disease Brother 47       MI   BP 132/84    Pulse 83    Wt 99 kg (218 lb 3.2 oz)    SpO2 98%    BMI 32.22 kg/m   Wt Readings from Last 3 Encounters:  06/02/21 99 kg (218 lb 3.2 oz)  05/19/21 100.3 kg (221 lb 3.2 oz)  05/11/21 98.5 kg (217 lb 4 oz)   PHYSICAL EXAM: General:  NAD. No resp difficulty HEENT: Normal Neck: Supple. No JVD. Carotids  2+ bilat; no bruits. No lymphadenopathy or thryomegaly appreciated. Cor: PMI nondisplaced. Regular rate & rhythm. No rubs, gallops or murmurs. Lungs: Clear Abdomen: Soft, nontender, nondistended. No hepatosplenomegaly. No bruits or masses. Good bowel sounds. Extremities: No cyanosis, clubbing, rash, edema Neuro: Alert & oriented x 3, cranial nerves grossly intact. Moves  all 4 extremities w/o difficulty. Affect pleasant.  ECG: NSR + LVH  ASSESSMENT & PLAN:  - Echo 12/22 EF 15-20%, RV mildly reduced (no prior study for comparison, though records report prior EF ~25-30% at OSH).  - Etiology include ischemic HD vs HTN CM, vs Drug induced (ETOH CM +/- cocaine) - No h/o CP but will likely need diagnostic R/LHC (multiple RFs for CAD) if EF remains low on repeat echo. - cMRI if cath unrevealing, but no insurance.  - BP control and absence from cocaine and ETOH imperative. - NYHA II, volume looks good today. - Start Farxiga 10 mg daily (Hgb A1c 6.4, GFR >60). - Continue Entresto 49-51 mg bid. - Continue Spiro 25 mg daily  - Continue Coreg 6.25 mg bid  - Continue Lasix 40 mg daily.  - Gifted a scale today and isntructed to weigh daily. - BMET/BNP today, repeat BMET in 10-14 days.   2. Hypertension  - h/o poor control, now improved w/ improved med compliance  - GDMT per above  - reports h/o snoring. Needs sleep study once he has insurance  - BMET today    3. HLD - LDL 219 (12/22)  - Continue atorva 80. - Check lipids next visit.   4. Polysubstance Abuse - + history of cocaine, tobacco and ETOH use - UDS 12/22 + for cocaine. Denies further use post d/c  - absence from the above substances imperative given CV disease  - he has cut back on ETOH, encouraged complete cessation.   5. SDOH - HFSW to help w/ insurance (has medicaid paperwork).  - HF fund for meds.  - Given # for [employer] (needs to call them to give financial info, per Kathlee Nations). - Given # for [employer].  Long discussion  about work up for weak hearts, including R/LHC. In the absence of chest pain and acute ECG changes, defer cath. If repeat echo shows EF remains down, will arrange.   Follow up with APP in 4 weeks (change Lasix to PRN and increase Entresto) and in 10-12 weeks with Dr. Haroldine Laws + echo.  Allena Katz, FNP-BC 06/02/21

## 2021-06-02 ENCOUNTER — Ambulatory Visit (HOSPITAL_COMMUNITY)
Admission: RE | Admit: 2021-06-02 | Discharge: 2021-06-02 | Disposition: A | Discharge status: Home / Self Care | Payer: Medicaid Other | Source: Ambulatory Visit | Attending: Family Medicine | Admitting: Family Medicine

## 2021-06-02 ENCOUNTER — Other Ambulatory Visit (HOSPITAL_COMMUNITY): Payer: Self-pay

## 2021-06-02 ENCOUNTER — Other Ambulatory Visit: Payer: Self-pay

## 2021-06-02 ENCOUNTER — Encounter (HOSPITAL_COMMUNITY): Payer: Self-pay

## 2021-06-02 VITALS — BP 132/84 | HR 83 | Wt 218.2 lb

## 2021-06-02 DIAGNOSIS — I5022 Chronic systolic (congestive) heart failure: Secondary | ICD-10-CM | POA: Insufficient documentation

## 2021-06-02 DIAGNOSIS — F1410 Cocaine abuse, uncomplicated: Secondary | ICD-10-CM | POA: Diagnosis not present

## 2021-06-02 DIAGNOSIS — F17210 Nicotine dependence, cigarettes, uncomplicated: Secondary | ICD-10-CM | POA: Diagnosis not present

## 2021-06-02 DIAGNOSIS — Z7984 Long term (current) use of oral hypoglycemic drugs: Secondary | ICD-10-CM | POA: Insufficient documentation

## 2021-06-02 DIAGNOSIS — Z79899 Other long term (current) drug therapy: Secondary | ICD-10-CM | POA: Insufficient documentation

## 2021-06-02 DIAGNOSIS — Z597 Insufficient social insurance and welfare support: Secondary | ICD-10-CM | POA: Insufficient documentation

## 2021-06-02 DIAGNOSIS — Z596 Low income: Secondary | ICD-10-CM | POA: Diagnosis not present

## 2021-06-02 DIAGNOSIS — E785 Hyperlipidemia, unspecified: Secondary | ICD-10-CM | POA: Insufficient documentation

## 2021-06-02 DIAGNOSIS — Z5982 Transportation insecurity: Secondary | ICD-10-CM | POA: Insufficient documentation

## 2021-06-02 DIAGNOSIS — F1090 Alcohol use, unspecified, uncomplicated: Secondary | ICD-10-CM | POA: Diagnosis not present

## 2021-06-02 DIAGNOSIS — Z560 Unemployment, unspecified: Secondary | ICD-10-CM | POA: Diagnosis not present

## 2021-06-02 DIAGNOSIS — I110 Hypertensive heart disease with heart failure: Secondary | ICD-10-CM | POA: Insufficient documentation

## 2021-06-02 DIAGNOSIS — Z9114 Patient's other noncompliance with medication regimen: Secondary | ICD-10-CM | POA: Insufficient documentation

## 2021-06-02 DIAGNOSIS — R7303 Prediabetes: Secondary | ICD-10-CM | POA: Diagnosis not present

## 2021-06-02 DIAGNOSIS — Z139 Encounter for screening, unspecified: Secondary | ICD-10-CM

## 2021-06-02 DIAGNOSIS — I10 Essential (primary) hypertension: Secondary | ICD-10-CM | POA: Diagnosis not present

## 2021-06-02 DIAGNOSIS — Z733 Stress, not elsewhere classified: Secondary | ICD-10-CM | POA: Diagnosis not present

## 2021-06-02 DIAGNOSIS — F1910 Other psychoactive substance abuse, uncomplicated: Secondary | ICD-10-CM

## 2021-06-02 DIAGNOSIS — Z8249 Family history of ischemic heart disease and other diseases of the circulatory system: Secondary | ICD-10-CM | POA: Insufficient documentation

## 2021-06-02 LAB — BASIC METABOLIC PANEL
Anion gap: 9 (ref 5–15)
BUN: 8 mg/dL (ref 6–20)
CO2: 24 mmol/L (ref 22–32)
Calcium: 9.6 mg/dL (ref 8.9–10.3)
Chloride: 105 mmol/L (ref 98–111)
Creatinine, Ser: 0.72 mg/dL (ref 0.61–1.24)
GFR, Estimated: >60 mL/min (ref >60)
Glucose, Bld: 107 mg/dL — ABNORMAL HIGH (ref 70–99)
Potassium: 3.5 mmol/L (ref 3.5–5.1)
Sodium: 138 mmol/L (ref 135–145)

## 2021-06-02 MED ORDER — ENTRESTO 49-51 MG PO TABS
1.0000 | ORAL_TABLET | Freq: Two times a day (BID) | ORAL | 11 refills | Status: DC
  Filled 2021-06-02: qty 60, 30d supply, fill #0

## 2021-06-02 MED ORDER — ATORVASTATIN CALCIUM 80 MG PO TABS
80.0000 mg | ORAL_TABLET | Freq: Every day | ORAL | 4 refills | Status: DC
  Filled 2021-06-02: qty 30, 30d supply, fill #0
  Filled 2021-07-31: qty 30, 30d supply, fill #1
  Filled 2021-08-31: qty 30, 30d supply, fill #2
  Filled 2021-10-13: qty 30, 30d supply, fill #3
  Filled 2021-11-27: qty 30, 30d supply, fill #4

## 2021-06-02 MED ORDER — METFORMIN HCL 500 MG PO TABS
500.0000 mg | ORAL_TABLET | Freq: Two times a day (BID) | ORAL | 4 refills | Status: DC
  Filled 2021-06-02: qty 60, 30d supply, fill #0
  Filled 2021-07-31: qty 60, 30d supply, fill #1
  Filled 2021-08-31: qty 60, 30d supply, fill #2

## 2021-06-02 MED ORDER — CARVEDILOL 6.25 MG PO TABS
6.2500 mg | ORAL_TABLET | Freq: Two times a day (BID) | ORAL | 4 refills | Status: DC
  Filled 2021-06-02: qty 30, 15d supply, fill #0
  Filled 2021-07-31: qty 30, 15d supply, fill #1
  Filled 2021-08-31: qty 30, 15d supply, fill #2

## 2021-06-02 MED ORDER — SPIRONOLACTONE 25 MG PO TABS
25.0000 mg | ORAL_TABLET | Freq: Every day | ORAL | 4 refills | Status: DC
  Filled 2021-06-02: qty 30, 30d supply, fill #0
  Filled 2021-07-31: qty 30, 30d supply, fill #1
  Filled 2021-08-31: qty 30, 30d supply, fill #2
  Filled 2021-10-13: qty 30, 30d supply, fill #3
  Filled 2021-11-27: qty 30, 30d supply, fill #4

## 2021-06-02 MED ORDER — FUROSEMIDE 40 MG PO TABS
40.0000 mg | ORAL_TABLET | Freq: Every day | ORAL | 4 refills | Status: DC
  Filled 2021-06-02: qty 30, 30d supply, fill #0

## 2021-06-02 MED ORDER — DAPAGLIFLOZIN PROPANEDIOL 10 MG PO TABS
10.0000 mg | ORAL_TABLET | Freq: Every day | ORAL | 4 refills | Status: DC
  Filled 2021-06-02: qty 30, 30d supply, fill #0
  Filled 2021-07-31: qty 30, 30d supply, fill #1
  Filled 2021-08-31: qty 30, 30d supply, fill #2
  Filled 2021-10-13: qty 30, 30d supply, fill #3
  Filled 2021-11-27: qty 30, 30d supply, fill #4

## 2021-06-02 NOTE — Progress Notes (Signed)
CSW checked financial counseling notes to see status of Medicaid app.  Confirmed Joseph Reese is assisting pt in applying for Medicaid- they are submitting application today per notes.  Burna Sis, LCSW Clinical Social Worker [facility] Desk#: [phone] Cell#: [phone]

## 2021-06-02 NOTE — Patient Instructions (Addendum)
Thank you for coming in today  Labs were done today, if any labs are abnormal the clinic will call you   START Farxiga 10 mg 1 tablet daily  Your physician recommends that you return for lab work in: 2-3 weeks  Your physician recommends that you schedule a follow-up appointment in:  4 weeks in clinic  10-12 weeks with Dr. Gala Romney with echocardiogram  Your physician has requested that you have an echocardiogram. Echocardiography is a painless test that uses sound waves to create images of your heart. It provides your doctor with information about the size and shape of your heart and how well your hearts chambers and valves are working. This procedure takes approximately one hour. There are no restrictions for this procedure.  [employer] [phone]  PLEASE CALL [employer] for Entresto financial Assistance [phone]    At the [facility], you and your health needs are our priority. As part of our continuing mission to provide you with exceptional heart care, we have created designated Provider Care Teams. These Care Teams include your primary Cardiologist (physician) and Advanced Practice Providers (APPs- Physician Assistants and Nurse Practitioners) who all work together to provide you with the care you need, when you need it.   You may see any of the following providers on your designated Care Team at your next follow up: Dr Arvilla Meres Dr Carron Curie, NP Robbie Lis, [place] [facility] [place], [place] Karle Plumber, PharmD   Please be sure to bring in all your medications bottles to every appointment.   If you have any questions or concerns before your next appointment please send [place] a message through [place] or call our office at [phone].    TO LEAVE A MESSAGE FOR THE NURSE SELECT OPTION 2, PLEASE LEAVE A MESSAGE INCLUDING: YOUR NAME DATE OF BIRTH CALL BACK NUMBER REASON FOR CALL**this is important as we  prioritize the call backs  YOU WILL RECEIVE A CALL BACK THE SAME DAY AS LONG AS YOU CALL BEFORE 4:00 PM

## 2021-06-12 ENCOUNTER — Other Ambulatory Visit (HOSPITAL_COMMUNITY): Payer: Self-pay

## 2021-06-16 ENCOUNTER — Other Ambulatory Visit (HOSPITAL_COMMUNITY): Payer: Self-pay

## 2021-06-16 ENCOUNTER — Other Ambulatory Visit: Payer: Self-pay

## 2021-06-16 ENCOUNTER — Ambulatory Visit (HOSPITAL_COMMUNITY)
Admission: RE | Admit: 2021-06-16 | Discharge: 2021-06-16 | Disposition: A | Discharge status: Home / Self Care | Payer: Medicaid Other | Source: Ambulatory Visit | Attending: Cardiology | Admitting: Cardiology

## 2021-06-16 DIAGNOSIS — I5022 Chronic systolic (congestive) heart failure: Secondary | ICD-10-CM | POA: Diagnosis not present

## 2021-06-16 LAB — BASIC METABOLIC PANEL
Anion gap: 8 (ref 5–15)
BUN: 10 mg/dL (ref 6–20)
CO2: 23 mmol/L (ref 22–32)
Calcium: 9.3 mg/dL (ref 8.9–10.3)
Chloride: 104 mmol/L (ref 98–111)
Creatinine, Ser: 1.02 mg/dL (ref 0.61–1.24)
GFR, Estimated: >60 mL/min (ref >60)
Glucose, Bld: 99 mg/dL (ref 70–99)
Potassium: 3.7 mmol/L (ref 3.5–5.1)
Sodium: 135 mmol/L (ref 135–145)

## 2021-06-16 MED ORDER — ENTRESTO 49-51 MG PO TABS: 1.0000 | ORAL_TABLET | Freq: Two times a day (BID) | ORAL | 3 refills | Status: DC

## 2021-06-23 NOTE — Telephone Encounter (Signed)
Sent in application via fax.  Will follow up.  

## 2021-06-30 ENCOUNTER — Other Ambulatory Visit: Payer: Self-pay

## 2021-06-30 ENCOUNTER — Encounter (HOSPITAL_COMMUNITY): Payer: Self-pay

## 2021-06-30 ENCOUNTER — Ambulatory Visit (HOSPITAL_COMMUNITY)
Admission: RE | Admit: 2021-06-30 | Discharge: 2021-06-30 | Disposition: A | Discharge status: Home / Self Care | Payer: Medicaid Other | Source: Ambulatory Visit | Attending: Family Medicine | Admitting: Family Medicine

## 2021-06-30 ENCOUNTER — Other Ambulatory Visit (HOSPITAL_COMMUNITY): Payer: Self-pay

## 2021-06-30 VITALS — BP 130/80 | HR 77 | Wt 218.8 lb

## 2021-06-30 DIAGNOSIS — I110 Hypertensive heart disease with heart failure: Secondary | ICD-10-CM | POA: Insufficient documentation

## 2021-06-30 DIAGNOSIS — F17210 Nicotine dependence, cigarettes, uncomplicated: Secondary | ICD-10-CM | POA: Diagnosis not present

## 2021-06-30 DIAGNOSIS — I5022 Chronic systolic (congestive) heart failure: Secondary | ICD-10-CM | POA: Diagnosis not present

## 2021-06-30 DIAGNOSIS — Z79899 Other long term (current) drug therapy: Secondary | ICD-10-CM | POA: Insufficient documentation

## 2021-06-30 DIAGNOSIS — F1090 Alcohol use, unspecified, uncomplicated: Secondary | ICD-10-CM | POA: Insufficient documentation

## 2021-06-30 DIAGNOSIS — F1910 Other psychoactive substance abuse, uncomplicated: Secondary | ICD-10-CM

## 2021-06-30 DIAGNOSIS — Z139 Encounter for screening, unspecified: Secondary | ICD-10-CM

## 2021-06-30 DIAGNOSIS — Z8249 Family history of ischemic heart disease and other diseases of the circulatory system: Secondary | ICD-10-CM | POA: Insufficient documentation

## 2021-06-30 DIAGNOSIS — E785 Hyperlipidemia, unspecified: Secondary | ICD-10-CM

## 2021-06-30 DIAGNOSIS — I10 Essential (primary) hypertension: Secondary | ICD-10-CM

## 2021-06-30 LAB — LIPID PANEL
Cholesterol: 164 mg/dL (ref 0–200)
HDL: 47 mg/dL (ref >40)
LDL Cholesterol: 104 mg/dL — ABNORMAL HIGH (ref 0–99)
Total CHOL/HDL Ratio: 3.5 RATIO
Triglycerides: 63 mg/dL (ref <150)
VLDL: 13 mg/dL (ref 0–40)

## 2021-06-30 LAB — BASIC METABOLIC PANEL
Anion gap: 8 (ref 5–15)
BUN: 12 mg/dL (ref 6–20)
CO2: 27 mmol/L (ref 22–32)
Calcium: 9.4 mg/dL (ref 8.9–10.3)
Chloride: 105 mmol/L (ref 98–111)
Creatinine, Ser: 1.07 mg/dL (ref 0.61–1.24)
GFR, Estimated: >60 mL/min (ref >60)
Glucose, Bld: 109 mg/dL — ABNORMAL HIGH (ref 70–99)
Potassium: 4 mmol/L (ref 3.5–5.1)
Sodium: 140 mmol/L (ref 135–145)

## 2021-06-30 MED ORDER — ENTRESTO 97-103 MG PO TABS: 1.0000 | ORAL_TABLET | Freq: Two times a day (BID) | ORAL | 3 refills | Status: DC

## 2021-06-30 MED ORDER — FUROSEMIDE 40 MG PO TABS
40.0000 mg | ORAL_TABLET | ORAL | 4 refills | Status: DC | PRN
  Filled 2021-06-30 – 2021-07-31 (×2): qty 30, 30d supply, fill #0
  Filled 2021-08-31: qty 30, 30d supply, fill #1
  Filled 2021-10-13: qty 30, 30d supply, fill #2
  Filled 2021-11-27: qty 30, 30d supply, fill #3
  Filled 2021-12-01: qty 30, 30d supply, fill #4

## 2021-06-30 NOTE — Patient Instructions (Signed)
INCREASE Entresto to 97/103 mg, one tab twice a day -you have been given samples of 49/51 mg tablets please take TWO TABS TWICE A DAY, until we hear back from [facility]   CHANGE Lasix to 40 mg daily AS NEEDED for swelling or weight gain  Labs today We will only contact you if something comes back abnormal or we need to make some changes. Otherwise no news is good news!  Labs needed in 10-14 days  Keep follow up as scheduled  Do the following things EVERYDAY: Weigh yourself in the morning before breakfast. Write it down and keep it in a log. Take your medicines as prescribed Eat low salt foods--Limit salt (sodium) to 2000 mg per day.  Stay as active as you can everyday Limit all fluids for the day to less than 2 liters  At the [facility], you and your health needs are our priority. As part of our continuing mission to provide you with exceptional heart care, we have created designated Provider Care Teams. These Care Teams include your primary Cardiologist (physician) and Advanced Practice Providers (APPs- Physician Assistants and Nurse Practitioners) who all work together to provide you with the care you need, when you need it.   You may see any of the following providers on your designated Care Team at your next follow up: Dr Glori Bickers Dr Haynes Kerns, NP Lyda Jester, [place] [facility] [place], [place] Audry Riles, PharmD   Please be sure to bring in all your medications bottles to every appointment.   If you have any questions or concerns before your next appointment please send [place] a message through [place] or call our office at [phone].    TO LEAVE A MESSAGE FOR THE NURSE SELECT OPTION 2, PLEASE LEAVE A MESSAGE INCLUDING: YOUR NAME DATE OF BIRTH CALL BACK NUMBER REASON FOR CALL**this is important as we prioritize the call backs  YOU WILL RECEIVE A CALL BACK THE SAME DAY AS LONG AS YOU CALL BEFORE 4:00 PM

## 2021-06-30 NOTE — Progress Notes (Signed)
Medication Samples have been provided to the patient.  Drug name: entresto       Strength: 49/51mg         Qty: 56  LOTVL:[id number]  Exp.Date: 05/2023  Dosing instructions: two tab twice a day  The patient has been instructed regarding the correct time, dose, and frequency of taking this medication, including desired effects and most common side effects.   Kerry Dory 9:11 AM 06/30/2021

## 2021-06-30 NOTE — Progress Notes (Signed)
ADVANCED HF CLINIC NOTE  PCP: [facility] Cardiologist: Dr. Haroldine Laws  HPI: Joseph Reese is a 56 y.o. male w/ h/o chronic systolic heart failure, first diagnosed in [place], h/o poor compliance, HTN, cocaine, ETOH and tobacco abuse. Uninsured.    Admitted to [facility] 12/22 w/ acute on chronic HF. BP was markedly elevated on admit c/w hypertensive emergency. USD + for cocaine. HS trop 58>>32>>54>>65. Echo EF 15-20%, RV mildly reduced. Per chart, records from OSH showed EF previously 25-30%. He was diuresed and placed on GDMT. Given concern for noncompliance, RHC/LHC deferred to the out-patient setting to ensure he is following up regularly and is compliant with medications, as would need to be compliant with DAPT if PCI needed/performed. Referred to [facility] clinic. D/c Wt 213 lb.   Seen in [facility] 05/19/21 no further drug use and cut down on ETOH. Strong CAD family history, 3 brothers died from MIs, ages 36, 17 and 33 y/o. Entresto started.   Today he returns for HF follow up. He has mild SOB with stairs but does ok on flat ground. He does not have a driver's license and does a lot of walking. Overall feeling fine. Denies abnormal bleeding, palpitations, CP, dizziness, edema, or PND/Orthopnea. Appetite ok. No fever or chills. He is now weighing at home, weight 218-219. Taking all medications. Smokes 4-5 cigs/day, ETOH 2 beers on weekend, no further cocaine use. He does not work. He has 4 grown daughters and 3 grandchildren.   Cardiac Testing  - Echo 05/06/2021 EF: 15-20%, severe LV dysfunction, moderate LVH, RV mildly down.  Past Medical History:  Diagnosis Date   CHF (congestive heart failure), NYHA class II (HCC)    Cocaine abuse ([place])    Diabetes mellitus without complication (HCC)    ETOH abuse    Hypertension    Tobacco abuse    Current Outpatient Medications  Medication Sig Dispense Refill   atorvastatin (LIPITOR) 80 MG tablet Take 1 tablet (80 mg total) by mouth daily. 30 tablet 4    carvedilol (COREG) 6.25 MG tablet Take 1 tablet (6.25 mg total) by mouth 2 (two) times daily with a meal. 30 tablet 4   dapagliflozin propanediol (FARXIGA) 10 MG TABS tablet Take 1 tablet (10 mg total) by mouth daily before breakfast. 30 tablet 4   furosemide (LASIX) 40 MG tablet Take 1 tablet (40 mg total) by mouth daily. 30 tablet 4   metFORMIN (GLUCOPHAGE) 500 MG tablet Take 1 tablet (500 mg total) by mouth 2 (two) times daily with a meal. 60 tablet 4   sacubitril-valsartan (ENTRESTO) 49-51 MG Take 1 tablet by mouth 2 (two) times daily. 180 tablet 3   spironolactone (ALDACTONE) 25 MG tablet Take 1 tablet (25 mg total) by mouth daily. 30 tablet 4   No current facility-administered medications for this encounter.   No Known Allergies  Social History   Socioeconomic History   Marital status: Single    Spouse name: Not on file   Number of children: 4   Years of education: Not on file   Highest education level: High school graduate  Occupational History   Occupation: unemployed    Comment: contract roofer--unable to work since 12/2020, d/t physical  Tobacco Use   Smoking status: Every Day    Packs/day: 0.50    Years: 40.00    Pack years: 20.00    Types: Cigarettes   Smokeless tobacco: Never  Vaping Use   Vaping Use: Never used  Substance and Sexual  Activity   Alcohol use: Yes    Alcohol/week: 18.0 standard drinks    Types: 12 Cans of beer, 6 Shots of liquor per week    Comment: 40 years of consistent   Drug use: Yes    Frequency: 1.0 times per week    Types: Cocaine    Comment: last use 12/21, 3-4x/mo   Sexual activity: Not on file  Other Topics Concern   Not on file  Social History Narrative   Not on file   Social Determinants of Health   Financial Resource Strain: High Risk   Difficulty of Paying Living Expenses: Very hard  Food Insecurity: No Food Insecurity   Worried About Charity fundraiser in the Last Year: Never true   Arboriculturist in the Last Year: Never  true  Transportation Needs: Unmet Transportation Needs   Lack of Transportation (Medical): Yes   Lack of Transportation (Non-Medical): Yes  Physical Activity: Not on file  Stress: Not on file  Social Connections: Not on file  Intimate Partner Violence: Not on file   Family History  Problem Relation Age of Onset   Diabetes Mellitus II Brother    Coronary artery disease Brother 17       MI   Coronary artery disease Brother 58       MI   Coronary artery disease Brother 20       MI   BP 130/80    Pulse 77    Wt 99.2 kg (218 lb 12.8 oz)    SpO2 97%    BMI 32.31 kg/m   Wt Readings from Last 3 Encounters:  06/30/21 99.2 kg (218 lb 12.8 oz)  06/02/21 99 kg (218 lb 3.2 oz)  05/19/21 100.3 kg (221 lb 3.2 oz)   PHYSICAL EXAM: General:  NAD. No resp difficulty HEENT: Normal Neck: Supple. No JVD. Carotids 2+ bilat; no bruits. No lymphadenopathy or thryomegaly appreciated. Cor: PMI nondisplaced. Regular rate & rhythm. No rubs, gallops or murmurs. Lungs: Clear Abdomen: Obese, nontender, nondistended. No hepatosplenomegaly. No bruits or masses. Good bowel sounds. Extremities: No cyanosis, clubbing, rash, edema Neuro: Alert & oriented x 3, cranial nerves grossly intact. Moves all 4 extremities w/o difficulty. Affect pleasant.  ASSESSMENT & PLAN:  1. Chronic Systolic Heart Failure - Echo 12/22 EF 15-20%, RV mildly reduced (no prior study for comparison, though records report prior EF ~25-30% at OSH).  - Etiology include ischemic HD vs HTN CM, vs Drug induced (ETOH CM +/- cocaine) - No h/o CP but will likely need diagnostic R/LHC (multiple RFs for CAD) if EF remains low on repeat echo. - cMRI if cath unrevealing, but no insurance.  - BP control and absence from cocaine and ETOH imperative. - NYHA II, volume looks good today. - Increase Entresto to 97/103 mg bid. (49/51 samples given today, instructed to take 2 tabs bid) - Change Lasix to 40 mg daily PRN weight gain/edema. - Continue  Farxiga 10 mg daily (Hgb A1c 6.4) - Continue Spiro 25 mg daily  - Continue Coreg 6.25 mg bid  - Instructed to weigh daily. - BMET today, repeat in 101-4 days. - Repeat echo next visit.   2. Hypertension  - Improved med compliance.  - Increase Entresto as above. - reports h/o snoring. Needs sleep study once he has insurance.     3. HLD - LDL 219 (12/22)  - Continue atorva 80. - Check lipids today.   4. Polysubstance Abuse - + history of cocaine,  tobacco and ETOH use - UDS 12/22 + for cocaine.  - Abstinence imperative given CV disease.  - He has cut back on ETOH, encouraged complete cessation.   5. SDOH - HFSW helping w/ Medicaid application.  - HF fund for meds.  - Given # for [employer] (needs to call them to give financial info, per Kathlee Nations). - Given # for [employer].  Long discussion about work up for weak hearts, including R/LHC. In the absence of chest pain and acute ECG changes, defer cath. If repeat echo shows EF remains down, will arrange.   Follow up in 2 months with Dr. Haroldine Laws + echo as scheduled.  Allena Katz, FNP-BC 06/30/21

## 2021-07-11 ENCOUNTER — Other Ambulatory Visit (HOSPITAL_COMMUNITY): Payer: Self-pay

## 2021-07-14 ENCOUNTER — Other Ambulatory Visit (HOSPITAL_COMMUNITY): Payer: Self-pay

## 2021-07-18 ENCOUNTER — Ambulatory Visit (HOSPITAL_COMMUNITY)
Admission: RE | Admit: 2021-07-18 | Discharge: 2021-07-18 | Disposition: A | Discharge status: Home / Self Care | Payer: Medicaid Other | Source: Ambulatory Visit | Attending: Cardiology | Admitting: Cardiology

## 2021-07-18 ENCOUNTER — Other Ambulatory Visit: Payer: Self-pay

## 2021-07-18 DIAGNOSIS — I5022 Chronic systolic (congestive) heart failure: Secondary | ICD-10-CM | POA: Insufficient documentation

## 2021-07-18 LAB — BASIC METABOLIC PANEL
Anion gap: 9 (ref 5–15)
BUN: 11 mg/dL (ref 6–20)
CO2: 25 mmol/L (ref 22–32)
Calcium: 9.6 mg/dL (ref 8.9–10.3)
Chloride: 102 mmol/L (ref 98–111)
Creatinine, Ser: 1.21 mg/dL (ref 0.61–1.24)
GFR, Estimated: >60 mL/min (ref >60)
Glucose, Bld: 113 mg/dL — ABNORMAL HIGH (ref 70–99)
Potassium: 3.8 mmol/L (ref 3.5–5.1)
Sodium: 136 mmol/L (ref 135–145)

## 2021-07-31 ENCOUNTER — Other Ambulatory Visit (HOSPITAL_COMMUNITY): Payer: Self-pay

## 2021-08-03 ENCOUNTER — Other Ambulatory Visit (HOSPITAL_COMMUNITY): Payer: Self-pay

## 2021-08-03 DIAGNOSIS — I5022 Chronic systolic (congestive) heart failure: Secondary | ICD-10-CM

## 2021-08-07 ENCOUNTER — Other Ambulatory Visit (HOSPITAL_COMMUNITY): Payer: Self-pay

## 2021-08-07 NOTE — Telephone Encounter (Signed)
Patient is now insured with a managed medicaid plan. Will submit PA. Should not need to provide POI to [employer] once PA approved. ? ?Patient Advocate Encounter ?  ?Received notification from [employer] that prior authorization for Sherryll Burger is required. ?  ?PA submitted on CoverMyMeds ?Key [id number] ?Status is pending ?  ?Will continue to follow. ? ?

## 2021-08-08 ENCOUNTER — Other Ambulatory Visit (HOSPITAL_COMMUNITY): Payer: Self-pay

## 2021-08-08 ENCOUNTER — Other Ambulatory Visit (HOSPITAL_COMMUNITY): Payer: Self-pay | Admitting: *Deleted

## 2021-08-08 MED ORDER — ENTRESTO 97-103 MG PO TABS
1.0000 | ORAL_TABLET | Freq: Two times a day (BID) | ORAL | 3 refills | Status: DC
  Filled 2021-08-08: qty 180, 90d supply, fill #0

## 2021-08-08 NOTE — Telephone Encounter (Signed)
Advanced Heart Failure Patient Advocate Encounter ? ?Prior Authorization for Sherryll Burger has been approved.   ? ?Effective dates: 08/07/21 through 08/09/22 ? ?Patients co-pay is $4 (90 days) ? ?Archer Asa, CPhT ? ? ?

## 2021-08-11 ENCOUNTER — Other Ambulatory Visit (HOSPITAL_COMMUNITY): Payer: Self-pay

## 2021-08-22 ENCOUNTER — Encounter (HOSPITAL_COMMUNITY): Payer: Self-pay | Admitting: Internal Medicine

## 2021-08-22 ENCOUNTER — Ambulatory Visit (HOSPITAL_COMMUNITY): Payer: Medicaid Other

## 2021-08-31 ENCOUNTER — Other Ambulatory Visit: Payer: Self-pay

## 2021-08-31 ENCOUNTER — Other Ambulatory Visit (HOSPITAL_COMMUNITY): Payer: Self-pay

## 2021-08-31 ENCOUNTER — Other Ambulatory Visit (HOSPITAL_COMMUNITY): Payer: Self-pay | Admitting: Family Medicine

## 2021-08-31 DIAGNOSIS — I10 Essential (primary) hypertension: Secondary | ICD-10-CM

## 2021-08-31 MED ORDER — CARVEDILOL 6.25 MG PO TABS
6.2500 mg | ORAL_TABLET | Freq: Two times a day (BID) | ORAL | 4 refills | Status: DC
  Filled 2021-08-31 (×2): qty 60, 30d supply, fill #0
  Filled 2021-10-13: qty 60, 30d supply, fill #1
  Filled 2021-11-27: qty 60, 30d supply, fill #2
  Filled 2021-12-01: qty 60, 30d supply, fill #3

## 2021-09-20 NOTE — Progress Notes (Signed)
? ?ADVANCED HF CLINIC NOTE ? ?PCP: [facility] ?HF Cardiologist: Dr. Gala Romney ? ?HPI: ?Joseph Reese is a 56 y.o. male w/ h/o chronic systolic heart failure, first diagnosed in [place], h/o poor compliance, HTN, cocaine, ETOH and tobacco abuse. Uninsured.  ?  ?Admitted to [facility] 12/22 w/ acute on chronic HF. BP was markedly elevated on admit c/w hypertensive emergency. USD + for cocaine. HS trop 58>>32>>54>>65. Echo EF 15-20%, RV mildly reduced. Per chart, records from OSH showed EF previously 25-30%. He was diuresed and placed on GDMT. Given concern for noncompliance, RHC/LHC deferred to the out-patient setting to ensure he is following up regularly and is compliant with medications, as would need to be compliant with DAPT if PCI needed/performed. Referred to [facility] clinic. D/c Wt 213 lb.  ? ?Seen in [facility] 05/19/21 no further drug use and cut down on ETOH. Strong CAD family history, 3 brothers died from MIs, ages 68, 2 and 62 y/o. Entresto started. ? ?Today he returns for HF follow up. Overall feeling great. 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 Mild SOB with going up stairs. Occasional positional dizziness. Denies palpitations, CP, edema, or PND/Orthopnea. Appetite ok. No fever or chills. Weight at home 215 pounds. Taking all medications. 4-5 cigs/day, drinking 1-2 40 oz beers on the weekends, no recent cocaine. Has not used Lasix in a few weeks. He has 4 grown daughters and 3 grandchildren. ? ? ?Cardiac Testing  ?- Echo 05/06/2021 EF: 15-20%, severe LV dysfunction, moderate LVH, RV mildly down. ? ?Past Medical History:  ?Diagnosis Date  ? CHF (congestive heart failure), NYHA class II (HCC)   ? Cocaine abuse (HCC)   ? Diabetes mellitus without complication (HCC)   ? ETOH abuse   ? Hypertension   ? Tobacco abuse   ? ?Current Outpatient Medications  ?Medication Sig Dispense Refill  ? atorvastatin (LIPITOR) 80 MG tablet Take 1 tablet (80 mg  total) by mouth daily. 30 tablet 4  ? carvedilol (COREG) 6.25 MG tablet Take 1 tablet (6.25 mg total) by mouth 2 (two) times daily with a meal. 60 tablet 4  ? dapagliflozin propanediol (FARXIGA) 10 MG TABS tablet Take 1 tablet (10 mg total) by mouth daily before breakfast. 30 tablet 4  ? furosemide (LASIX) 40 MG tablet Take 1 tablet (40 mg total) by mouth as needed for fluid or edema. 30 tablet 4  ? metFORMIN (GLUCOPHAGE) 500 MG tablet Take 1 tablet (500 mg total) by mouth 2 (two) times daily with a meal. 60 tablet 4  ? sacubitril-valsartan (ENTRESTO) 97-103 MG Take 1 tablet by mouth 2 (two) times daily. 180 tablet 3  ? spironolactone (ALDACTONE) 25 MG tablet Take 1 tablet (25 mg total) by mouth daily. 30 tablet 4  ? ?No current facility-administered medications for this encounter.  ? ?No Known Allergies ? ?Social History  ? ?Socioeconomic History  ? Marital status: Single  ?  Spouse name: Not on file  ? Number of children: 4  ? Years of education: Not on file  ? Highest education level: High school graduate  ?Occupational History  ? Occupation: unemployed  ?  Comment: contract roofer--unable to work since 12/2020, d/t physical  ?Tobacco Use  ? Smoking status: Every Day  ?  Packs/day: 0.50  ?  Years: 40.00  ?  Pack years: 20.00  ?  Types: Cigarettes  ? Smokeless tobacco: Never  ?Vaping Use  ? Vaping  Use: Never used  ?Substance and Sexual Activity  ? Alcohol use: Yes  ?  Alcohol/week: 18.0 standard drinks  ?  Types: 12 Cans of beer, 6 Shots of liquor per week  ?  Comment: 40 years of consistent  ? Drug use: Yes  ?  Frequency: 1.0 times per week  ?  Types: Cocaine  ?  Comment: last use 12/21, 3-4x/mo  ? Sexual activity: Not on file  ?Other Topics Concern  ? Not on file  ?Social History Narrative  ? Not on file  ? ?Social Determinants of Health  ? ?Financial Resource Strain: High Risk  ? Difficulty of Paying Living Expenses: Very hard  ?Food Insecurity: No Food Insecurity  ? Worried About Programme researcher, broadcasting/film/videounning Out of Food in the Last  Year: Never true  ? Ran Out of Food in the Last Year: Never true  ?Transportation Needs: Unmet Transportation Needs  ? Lack of Transportation (Medical): Yes  ? Lack of Transportation (Non-Medical): Yes  ?Physical Activity: Not on file  ?Stress: Not on file  ?Social Connections: Not on file  ?Intimate Partner Violence: Not on file  ? ?Family History  ?Problem Relation Age of Onset  ? Diabetes Mellitus II Brother   ? Coronary artery disease Brother 1949  ?     MI  ? Coronary artery disease Brother [id number]  ?     MI  ? Coronary artery disease Brother [id number]  ?     MI  ? ?BP 134/88   Pulse 76   Wt 94.2 kg (207 lb 9.6 oz)   SpO2 99%   BMI 30.66 kg/m?  ? ?Wt Readings from Last 3 Encounters:  ?09/22/21 94.2 kg (207 lb 9.6 oz)  ?06/30/21 99.2 kg (218 lb 12.8 oz)  ?06/02/21 99 kg (218 lb 3.2 oz)  ? ?PHYSICAL EXAM: ?General:  NAD. No resp difficulty ?HEENT: Normal ?Neck: Supple. No JVD. Carotids 2+ bilat; no bruits. No lymphadenopathy or thryomegaly appreciated. ?Cor: PMI nondisplaced. Regular rate & rhythm. No rubs, gallops or murmurs. ?Lungs: Clear ?Abdomen: Soft, nontender, nondistended. No hepatosplenomegaly. No bruits or masses. Good bowel sounds. ?Extremities: No cyanosis, clubbing, rash, edema ?Neuro: Alert & oriented x 3, cranial nerves grossly intact. Moves all 4 extremities w/o difficulty. Affect pleasant. ? ?ASSESSMENT & PLAN: ? 1. Chronic Systolic Heart Failure ?- Echo 12/22 EF 15-20%, RV mildly reduced (no prior study for comparison, though records report prior EF ~25-30% at OSH).  ?- Etiology include ischemic HD vs HTN CM, vs Drug induced (ETOH CM +/- cocaine) ?- No h/o CP but will likely need diagnostic R/LHC (multiple RFs for CAD) if EF remains low on repeat echo. ?- cMRI if cath unrevealing, but no insurance.  ?- BP control and absence from cocaine and ETOH imperative. ?- NYHA II, volume looks good today. ?- Continue Entresto 97/103 mg bid. ?- Continue Lasix 40 mg daily PRN weight gain/edema. ?- Continue Farxiga 10  mg daily (Hgb A1c 6.4) ?- Continue spiro 25 mg daily.  ?- Continue Coreg 6.25 mg bid.  ?- Instructed to weigh daily. ?- BMET today. ?- Repeat echo next visit. ?  ?2. Hypertension  ?- Improved med compliance.  ?- BP on the high side today, but has not had meds yet. ?- reports h/o snoring. Needs sleep study once he has insurance.   ?  ?3. HLD ?- LDL 104 (2/23)  ?- Continue atorva 80. ?  ?4. Polysubstance Abuse ?- + history of cocaine, tobacco and ETOH use ?- UDS 12/22 + for  cocaine.  ?- Abstinence imperative given CV disease.  ?- He has cut back on ETOH, encouraged complete cessation. ?  ?5. SDOH ?- HFSW helping w/ Medicaid application.  ?- HF fund for meds.  ? ?Follow up in 2 months with Dr. Gala Romney + echo as scheduled. If echo showed EF < 35%, consider cath. ? ?Prince Rome, FNP-BC ?09/22/21 ? ?

## 2021-09-22 ENCOUNTER — Ambulatory Visit (HOSPITAL_COMMUNITY)
Admission: RE | Admit: 2021-09-22 | Discharge: 2021-09-22 | Disposition: A | Discharge status: Home / Self Care | Payer: 59 | Source: Ambulatory Visit | Attending: Internal Medicine | Admitting: Internal Medicine

## 2021-09-22 ENCOUNTER — Encounter (HOSPITAL_COMMUNITY): Payer: Self-pay

## 2021-09-22 ENCOUNTER — Ambulatory Visit (HOSPITAL_BASED_OUTPATIENT_CLINIC_OR_DEPARTMENT_OTHER)
Admission: RE | Admit: 2021-09-22 | Discharge: 2021-09-22 | Disposition: A | Discharge status: Home / Self Care | Payer: 59 | Source: Ambulatory Visit | Attending: Family Medicine | Admitting: Family Medicine

## 2021-09-22 VITALS — BP 134/88 | HR 76 | Wt 207.6 lb

## 2021-09-22 DIAGNOSIS — Z720 Tobacco use: Secondary | ICD-10-CM | POA: Diagnosis not present

## 2021-09-22 DIAGNOSIS — E785 Hyperlipidemia, unspecified: Secondary | ICD-10-CM

## 2021-09-22 DIAGNOSIS — I5022 Chronic systolic (congestive) heart failure: Secondary | ICD-10-CM

## 2021-09-22 DIAGNOSIS — F1910 Other psychoactive substance abuse, uncomplicated: Secondary | ICD-10-CM

## 2021-09-22 DIAGNOSIS — I110 Hypertensive heart disease with heart failure: Secondary | ICD-10-CM | POA: Insufficient documentation

## 2021-09-22 DIAGNOSIS — Z79899 Other long term (current) drug therapy: Secondary | ICD-10-CM | POA: Insufficient documentation

## 2021-09-22 DIAGNOSIS — I2510 Atherosclerotic heart disease of native coronary artery without angina pectoris: Secondary | ICD-10-CM | POA: Diagnosis not present

## 2021-09-22 DIAGNOSIS — Z139 Encounter for screening, unspecified: Secondary | ICD-10-CM

## 2021-09-22 DIAGNOSIS — I998 Other disorder of circulatory system: Secondary | ICD-10-CM | POA: Insufficient documentation

## 2021-09-22 DIAGNOSIS — I10 Essential (primary) hypertension: Secondary | ICD-10-CM | POA: Diagnosis not present

## 2021-09-22 LAB — ECHOCARDIOGRAM COMPLETE
AR max vel: 2.13 cm2
AV Area VTI: 2.08 cm2
AV Area mean vel: 2.16 cm2
AV Mean grad: 4 mmHg
AV Peak grad: 7.5 mmHg
Ao pk vel: 1.37 m/s
Area-P 1/2: 2.91 cm2
Calc EF: 38.1 %
S' Lateral: 4 cm
Single Plane A2C EF: 38 %
Single Plane A4C EF: 39.6 %

## 2021-09-22 LAB — BASIC METABOLIC PANEL
Anion gap: 9 (ref 5–15)
BUN: 10 mg/dL (ref 6–20)
CO2: 24 mmol/L (ref 22–32)
Calcium: 9.3 mg/dL (ref 8.9–10.3)
Chloride: 107 mmol/L (ref 98–111)
Creatinine, Ser: 0.84 mg/dL (ref 0.61–1.24)
GFR, Estimated: >60 mL/min (ref >60)
Glucose, Bld: 107 mg/dL — ABNORMAL HIGH (ref 70–99)
Potassium: 3.7 mmol/L (ref 3.5–5.1)
Sodium: 140 mmol/L (ref 135–145)

## 2021-09-22 NOTE — Patient Instructions (Signed)
It was great to see you today! No medication changes are needed at this time.   Labs today We will only contact you if something comes back abnormal or we need to make some changes. Otherwise no news is good news!  Your physician recommends that you schedule a follow-up appointment in: 3 months with Dr Bensimhon   Do the following things EVERYDAY: Weigh yourself in the morning before breakfast. Write it down and keep it in a log. Take your medicines as prescribed Eat low salt foods--Limit salt (sodium) to 2000 mg per day.  Stay as active as you can everyday Limit all fluids for the day to less than 2 liters  At the [facility], you and your health needs are our priority. As part of our continuing mission to provide you with exceptional heart care, we have created designated Provider Care Teams. These Care Teams include your primary Cardiologist (physician) and Advanced Practice Providers (APPs- Physician Assistants and Nurse Practitioners) who all work together to provide you with the care you need, when you need it.   You may see any of the following providers on your designated Care Team at your next follow up: Dr Daniel Bensimhon Dr Dalton McLean Amy Clegg, NP Brittainy Simmons, PA Jessica Milford,NP Lindsay Finch, PA Lauren Kemp, PharmD   Please be sure to bring in all your medications bottles to every appointment.    

## 2021-09-22 NOTE — Progress Notes (Signed)
?  Echocardiogram ?2D Echocardiogram has been performed. ? ?Joseph Reese ?09/22/2021, 9:48 AM ?

## 2021-10-13 ENCOUNTER — Other Ambulatory Visit (HOSPITAL_COMMUNITY): Payer: Self-pay

## 2021-10-16 ENCOUNTER — Other Ambulatory Visit (HOSPITAL_COMMUNITY): Payer: Self-pay

## 2021-11-27 ENCOUNTER — Other Ambulatory Visit (HOSPITAL_COMMUNITY): Payer: Self-pay

## 2021-11-30 ENCOUNTER — Other Ambulatory Visit (HOSPITAL_COMMUNITY): Payer: Self-pay

## 2021-12-01 ENCOUNTER — Other Ambulatory Visit (HOSPITAL_COMMUNITY): Payer: Self-pay

## 2021-12-01 ENCOUNTER — Other Ambulatory Visit (HOSPITAL_COMMUNITY): Payer: Self-pay | Admitting: Family Medicine

## 2021-12-01 DIAGNOSIS — E785 Hyperlipidemia, unspecified: Secondary | ICD-10-CM

## 2021-12-01 DIAGNOSIS — I10 Essential (primary) hypertension: Secondary | ICD-10-CM

## 2021-12-04 ENCOUNTER — Other Ambulatory Visit (HOSPITAL_COMMUNITY): Payer: Self-pay

## 2021-12-04 MED ORDER — DAPAGLIFLOZIN PROPANEDIOL 10 MG PO TABS
10.0000 mg | ORAL_TABLET | Freq: Every day | ORAL | 4 refills | Status: DC
  Filled 2021-12-04: qty 30, 30d supply, fill #0

## 2021-12-04 MED ORDER — SPIRONOLACTONE 25 MG PO TABS
25.0000 mg | ORAL_TABLET | Freq: Every day | ORAL | 4 refills | Status: DC
  Filled 2021-12-04: qty 30, 30d supply, fill #0

## 2021-12-04 MED ORDER — ATORVASTATIN CALCIUM 80 MG PO TABS
80.0000 mg | ORAL_TABLET | Freq: Every day | ORAL | 4 refills | Status: DC
  Filled 2021-12-04: qty 30, 30d supply, fill #0

## 2021-12-11 ENCOUNTER — Other Ambulatory Visit (HOSPITAL_COMMUNITY): Payer: Self-pay

## 2021-12-28 ENCOUNTER — Ambulatory Visit (HOSPITAL_COMMUNITY)
Admission: RE | Admit: 2021-12-28 | Discharge: 2021-12-28 | Disposition: A | Discharge status: Home / Self Care | Payer: 59 | Source: Ambulatory Visit | Attending: Internal Medicine | Admitting: Internal Medicine

## 2021-12-28 ENCOUNTER — Other Ambulatory Visit: Payer: Self-pay

## 2021-12-28 VITALS — BP 138/80 | HR 76 | Wt 203.5 lb

## 2021-12-28 DIAGNOSIS — I110 Hypertensive heart disease with heart failure: Secondary | ICD-10-CM | POA: Diagnosis not present

## 2021-12-28 DIAGNOSIS — J810 Acute pulmonary edema: Secondary | ICD-10-CM

## 2021-12-28 DIAGNOSIS — Z7984 Long term (current) use of oral hypoglycemic drugs: Secondary | ICD-10-CM | POA: Diagnosis not present

## 2021-12-28 DIAGNOSIS — I5022 Chronic systolic (congestive) heart failure: Secondary | ICD-10-CM | POA: Diagnosis present

## 2021-12-28 DIAGNOSIS — J9601 Acute respiratory failure with hypoxia: Secondary | ICD-10-CM | POA: Diagnosis not present

## 2021-12-28 DIAGNOSIS — Z8249 Family history of ischemic heart disease and other diseases of the circulatory system: Secondary | ICD-10-CM | POA: Diagnosis not present

## 2021-12-28 DIAGNOSIS — I10 Essential (primary) hypertension: Secondary | ICD-10-CM

## 2021-12-28 DIAGNOSIS — E785 Hyperlipidemia, unspecified: Secondary | ICD-10-CM | POA: Diagnosis not present

## 2021-12-28 DIAGNOSIS — Z79899 Other long term (current) drug therapy: Secondary | ICD-10-CM | POA: Insufficient documentation

## 2021-12-28 DIAGNOSIS — Z720 Tobacco use: Secondary | ICD-10-CM | POA: Insufficient documentation

## 2021-12-28 DIAGNOSIS — I161 Hypertensive emergency: Secondary | ICD-10-CM | POA: Diagnosis not present

## 2021-12-28 DIAGNOSIS — F1910 Other psychoactive substance abuse, uncomplicated: Secondary | ICD-10-CM | POA: Diagnosis not present

## 2021-12-28 DIAGNOSIS — G4719 Other hypersomnia: Secondary | ICD-10-CM

## 2021-12-28 DIAGNOSIS — I2510 Atherosclerotic heart disease of native coronary artery without angina pectoris: Secondary | ICD-10-CM | POA: Diagnosis not present

## 2021-12-28 DIAGNOSIS — R7303 Prediabetes: Secondary | ICD-10-CM

## 2021-12-28 LAB — BASIC METABOLIC PANEL
Anion gap: 6 (ref 5–15)
BUN: 8 mg/dL (ref 6–20)
CO2: 27 mmol/L (ref 22–32)
Calcium: 9.4 mg/dL (ref 8.9–10.3)
Chloride: 107 mmol/L (ref 98–111)
Creatinine, Ser: 0.94 mg/dL (ref 0.61–1.24)
GFR, Estimated: >60 mL/min (ref >60)
Glucose, Bld: 91 mg/dL (ref 70–99)
Potassium: 3.8 mmol/L (ref 3.5–5.1)
Sodium: 140 mmol/L (ref 135–145)

## 2021-12-28 MED ORDER — SPIRONOLACTONE 25 MG PO TABS
25.0000 mg | ORAL_TABLET | Freq: Every day | ORAL | 3 refills | Status: DC
  Filled 2021-12-28: qty 90, 90d supply, fill #0
  Filled 2022-01-16: qty 30, 30d supply, fill #0
  Filled 2022-03-30: qty 90, 90d supply, fill #1

## 2021-12-28 MED ORDER — CARVEDILOL 6.25 MG PO TABS
6.2500 mg | ORAL_TABLET | Freq: Two times a day (BID) | ORAL | 3 refills | Status: DC
  Filled 2021-12-28: qty 180, 90d supply, fill #0
  Filled 2022-01-16: qty 60, 30d supply, fill #0

## 2021-12-28 MED ORDER — DAPAGLIFLOZIN PROPANEDIOL 10 MG PO TABS
10.0000 mg | ORAL_TABLET | Freq: Every day | ORAL | 3 refills | Status: DC
  Filled 2021-12-28: qty 90, 90d supply, fill #0
  Filled 2022-01-16: qty 30, 30d supply, fill #0
  Filled 2022-03-30: qty 90, 90d supply, fill #1

## 2021-12-28 MED ORDER — FUROSEMIDE 40 MG PO TABS
40.0000 mg | ORAL_TABLET | ORAL | 4 refills | Status: DC | PRN
  Filled 2021-12-28 – 2022-01-16 (×2): qty 30, 30d supply, fill #0
  Filled 2022-03-30: qty 30, 30d supply, fill #1

## 2021-12-28 MED ORDER — ENTRESTO 97-103 MG PO TABS
1.0000 | ORAL_TABLET | Freq: Two times a day (BID) | ORAL | 3 refills | Status: DC
  Filled 2021-12-28: qty 180, 90d supply, fill #0
  Filled 2022-01-16: qty 60, 30d supply, fill #0
  Filled 2022-03-30: qty 180, 90d supply, fill #1

## 2021-12-28 MED ORDER — METFORMIN HCL 500 MG PO TABS
500.0000 mg | ORAL_TABLET | Freq: Two times a day (BID) | ORAL | 3 refills | Status: DC
  Filled 2021-12-28: qty 180, 90d supply, fill #0
  Filled 2022-01-16: qty 60, 30d supply, fill #0
  Filled 2022-03-30: qty 180, 90d supply, fill #1

## 2021-12-28 MED ORDER — ATORVASTATIN CALCIUM 80 MG PO TABS
80.0000 mg | ORAL_TABLET | Freq: Every day | ORAL | 3 refills | Status: DC
  Filled 2021-12-28: qty 90, 90d supply, fill #0
  Filled 2022-01-16: qty 30, 30d supply, fill #0
  Filled 2022-03-30: qty 90, 90d supply, fill #1
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
## 2021-12-28 NOTE — Patient Instructions (Signed)
Refills sent for all your medications Will schedule three PharmD visits every 2 - 3 weeks and call you with details Will send referral for sleep study Return to Heart Failure Clinic in 3 months with echo
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
## 2021-12-29 ENCOUNTER — Other Ambulatory Visit: Payer: Self-pay

## 2022-01-01 ENCOUNTER — Other Ambulatory Visit (HOSPITAL_COMMUNITY): Payer: Self-pay

## 2022-01-02 ENCOUNTER — Other Ambulatory Visit: Payer: Self-pay

## 2022-01-02 ENCOUNTER — Other Ambulatory Visit (HOSPITAL_COMMUNITY): Payer: Self-pay

## 2022-01-05 ENCOUNTER — Other Ambulatory Visit: Payer: Self-pay

## 2022-01-16 ENCOUNTER — Other Ambulatory Visit (HOSPITAL_COMMUNITY): Payer: Self-pay

## 2022-01-16 ENCOUNTER — Other Ambulatory Visit: Payer: Self-pay
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
## 2022-01-18 ENCOUNTER — Inpatient Hospital Stay (HOSPITAL_COMMUNITY): Admission: RE | Admit: 2022-01-18 | Payer: 59 | Source: Ambulatory Visit

## 2022-01-22 ENCOUNTER — Other Ambulatory Visit: Payer: Self-pay

## 2022-01-22 ENCOUNTER — Ambulatory Visit (HOSPITAL_COMMUNITY)
Admission: RE | Admit: 2022-01-22 | Discharge: 2022-01-22 | Disposition: A | Discharge status: Home / Self Care | Payer: Medicaid Other | Source: Ambulatory Visit | Attending: Cardiology | Admitting: Cardiology

## 2022-01-22 VITALS — BP 134/84 | HR 82 | Wt 194.4 lb

## 2022-01-22 DIAGNOSIS — F1910 Other psychoactive substance abuse, uncomplicated: Secondary | ICD-10-CM | POA: Diagnosis not present

## 2022-01-22 DIAGNOSIS — E785 Hyperlipidemia, unspecified: Secondary | ICD-10-CM | POA: Diagnosis not present

## 2022-01-22 DIAGNOSIS — J9601 Acute respiratory failure with hypoxia: Secondary | ICD-10-CM | POA: Diagnosis not present

## 2022-01-22 DIAGNOSIS — I5022 Chronic systolic (congestive) heart failure: Secondary | ICD-10-CM | POA: Diagnosis not present

## 2022-01-22 DIAGNOSIS — I10 Essential (primary) hypertension: Secondary | ICD-10-CM | POA: Diagnosis not present

## 2022-01-22 DIAGNOSIS — J810 Acute pulmonary edema: Secondary | ICD-10-CM

## 2022-01-22 MED ORDER — CARVEDILOL 12.5 MG PO TABS
12.5000 mg | ORAL_TABLET | Freq: Two times a day (BID) | ORAL | 3 refills | Status: DC
  Filled 2022-01-22: qty 60, 30d supply, fill #0
  Filled 2022-03-30: qty 180, 90d supply, fill #0

## 2022-01-22 NOTE — Patient Instructions (Signed)
It was a pleasure seeing you today!  MEDICATIONS: -We are changing your medications today -Increase carvedilol to 12.5 mg (1 tablet) twice daily. You may take 2 tablets of the 6.25 mg strength twice daily until you pick up the new strength.  -Call if you have questions about your medications.   NEXT APPOINTMENT: Return to clinic in 2 months with Dr. Gala Romney.  In general, to take care of your heart failure: -Limit your fluid intake to 2 Liters (half-gallon) per day.   -Limit your salt intake to ideally 2-3 grams (2000-3000 mg) per day. -Weigh yourself daily and record, and bring that "weight diary" to your next appointment.  (Weight gain of 2-3 pounds in 1 day typically means fluid weight.) -The medications for your heart are to help your heart and help you live longer.   -Please contact us before stopping any of your heart medications.  Call the clinic at [phone] with questions or to reschedule future appointments.

## 2022-01-28 ENCOUNTER — Encounter (HOSPITAL_BASED_OUTPATIENT_CLINIC_OR_DEPARTMENT_OTHER): Payer: 59 | Admitting: Cardiovascular Disease

## 2022-01-29 ENCOUNTER — Other Ambulatory Visit: Payer: Self-pay

## 2022-02-25 ENCOUNTER — Ambulatory Visit (HOSPITAL_BASED_OUTPATIENT_CLINIC_OR_DEPARTMENT_OTHER): Payer: Medicaid Other | Attending: Internal Medicine | Admitting: Cardiovascular Disease

## 2022-03-29 ENCOUNTER — Encounter (HOSPITAL_COMMUNITY): Payer: 59 | Admitting: Internal Medicine

## 2022-03-30 ENCOUNTER — Other Ambulatory Visit (HOSPITAL_COMMUNITY): Payer: Self-pay

## 2022-04-18 ENCOUNTER — Other Ambulatory Visit (HOSPITAL_COMMUNITY): Payer: Self-pay
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
## 2022-06-04 ENCOUNTER — Other Ambulatory Visit (HOSPITAL_COMMUNITY): Payer: Self-pay

## 2022-06-04 ENCOUNTER — Ambulatory Visit (HOSPITAL_COMMUNITY)
Admission: RE | Admit: 2022-06-04 | Discharge: 2022-06-04 | Disposition: A | Discharge status: Home / Self Care | Payer: No Typology Code available for payment source | Source: Ambulatory Visit | Attending: Internal Medicine | Admitting: Internal Medicine

## 2022-06-04 VITALS — BP 150/98 | HR 91 | Wt 179.0 lb

## 2022-06-04 DIAGNOSIS — J9601 Acute respiratory failure with hypoxia: Secondary | ICD-10-CM

## 2022-06-04 DIAGNOSIS — Z79899 Other long term (current) drug therapy: Secondary | ICD-10-CM | POA: Diagnosis not present

## 2022-06-04 DIAGNOSIS — F1910 Other psychoactive substance abuse, uncomplicated: Secondary | ICD-10-CM | POA: Insufficient documentation

## 2022-06-04 DIAGNOSIS — F1011 Alcohol abuse, in remission: Secondary | ICD-10-CM | POA: Insufficient documentation

## 2022-06-04 DIAGNOSIS — I2510 Atherosclerotic heart disease of native coronary artery without angina pectoris: Secondary | ICD-10-CM | POA: Insufficient documentation

## 2022-06-04 DIAGNOSIS — E119 Type 2 diabetes mellitus without complications: Secondary | ICD-10-CM | POA: Insufficient documentation

## 2022-06-04 DIAGNOSIS — I110 Hypertensive heart disease with heart failure: Secondary | ICD-10-CM | POA: Insufficient documentation

## 2022-06-04 DIAGNOSIS — R9431 Abnormal electrocardiogram [ECG] [EKG]: Secondary | ICD-10-CM | POA: Diagnosis not present

## 2022-06-04 DIAGNOSIS — R7303 Prediabetes: Secondary | ICD-10-CM

## 2022-06-04 DIAGNOSIS — I10 Essential (primary) hypertension: Secondary | ICD-10-CM

## 2022-06-04 DIAGNOSIS — J810 Acute pulmonary edema: Secondary | ICD-10-CM

## 2022-06-04 DIAGNOSIS — F17210 Nicotine dependence, cigarettes, uncomplicated: Secondary | ICD-10-CM | POA: Insufficient documentation

## 2022-06-04 DIAGNOSIS — F1410 Cocaine abuse, uncomplicated: Secondary | ICD-10-CM | POA: Diagnosis not present

## 2022-06-04 DIAGNOSIS — I5022 Chronic systolic (congestive) heart failure: Secondary | ICD-10-CM | POA: Insufficient documentation

## 2022-06-04 DIAGNOSIS — Z91148 Patient's other noncompliance with medication regimen for other reason: Secondary | ICD-10-CM | POA: Insufficient documentation

## 2022-06-04 DIAGNOSIS — E785 Hyperlipidemia, unspecified: Secondary | ICD-10-CM

## 2022-06-04 DIAGNOSIS — Z7984 Long term (current) use of oral hypoglycemic drugs: Secondary | ICD-10-CM | POA: Insufficient documentation

## 2022-06-04 LAB — COMPREHENSIVE METABOLIC PANEL
ALT: 15 U/L (ref 0–44)
AST: 19 U/L (ref 15–41)
Albumin: 3.7 g/dL (ref 3.5–5.0)
Alkaline Phosphatase: 62 U/L (ref 38–126)
Anion gap: 13 (ref 5–15)
BUN: 7 mg/dL (ref 6–20)
CO2: 23 mmol/L (ref 22–32)
Calcium: 9.4 mg/dL (ref 8.9–10.3)
Chloride: 101 mmol/L (ref 98–111)
Creatinine, Ser: 0.8 mg/dL (ref 0.61–1.24)
GFR, Estimated: >60 mL/min (ref >60)
Glucose, Bld: 82 mg/dL (ref 70–99)
Potassium: 3.1 mmol/L — ABNORMAL LOW (ref 3.5–5.1)
Sodium: 137 mmol/L (ref 135–145)
Total Bilirubin: 1.3 mg/dL — ABNORMAL HIGH (ref 0.3–1.2)
Total Protein: 7.4 g/dL (ref 6.5–8.1)

## 2022-06-04 LAB — HEMOGLOBIN A1C
Hgb A1c MFr Bld: 4.7 % — ABNORMAL LOW (ref 4.8–5.6)
Mean Plasma Glucose: 88.19 mg/dL

## 2022-06-04 LAB — BRAIN NATRIURETIC PEPTIDE: B Natriuretic Peptide: 88.6 pg/mL (ref 0.0–100.0)

## 2022-06-04 MED ORDER — ENTRESTO 97-103 MG PO TABS
1.0000 | ORAL_TABLET | Freq: Two times a day (BID) | ORAL | 3 refills | Status: DC
  Filled 2022-06-04: qty 60, 30d supply, fill #0

## 2022-06-04 MED ORDER — DAPAGLIFLOZIN PROPANEDIOL 10 MG PO TABS
10.0000 mg | ORAL_TABLET | Freq: Every day | ORAL | 3 refills | Status: DC
  Filled 2022-06-04: qty 30, 30d supply, fill #0
  Filled 2022-06-04 (×2): qty 60, 60d supply, fill #0
  Filled 2022-12-27: qty 30, 30d supply, fill #1

## 2022-06-04 MED ORDER — SPIRONOLACTONE 25 MG PO TABS
25.0000 mg | ORAL_TABLET | Freq: Every day | ORAL | 3 refills | Status: DC
  Filled 2022-06-04: qty 30, 30d supply, fill #0

## 2022-06-04 MED ORDER — CARVEDILOL 12.5 MG PO TABS
12.5000 mg | ORAL_TABLET | Freq: Two times a day (BID) | ORAL | 3 refills | Status: DC
  Filled 2022-06-04: qty 60, 30d supply, fill #0

## 2022-06-04 MED ORDER — FUROSEMIDE 40 MG PO TABS
40.0000 mg | ORAL_TABLET | ORAL | 4 refills | Status: DC | PRN
  Filled 2022-06-04: qty 30, 30d supply, fill #0

## 2022-06-04 MED ORDER — ATORVASTATIN CALCIUM 80 MG PO TABS
80.0000 mg | ORAL_TABLET | Freq: Every day | ORAL | 3 refills | Status: DC
  Filled 2022-06-04: qty 30, 30d supply, fill #0

## 2022-06-04 NOTE — Addendum Note (Signed)
Encounter addended by: Kerry Dory, CMA on: 06/04/2022 9:36 AM  Actions taken: Visit diagnoses modified, Clinical Note Signed, Pharmacy for encounter modified, Order list changed, Diagnosis association updated

## 2022-06-04 NOTE — Addendum Note (Signed)
Encounter addended by: Kerry Dory, CMA on: 06/04/2022 11:08 AM  Actions taken: Charge Capture section accepted

## 2022-06-04 NOTE — Patient Instructions (Signed)
It was great to see you today! No medication changes are needed at this time.   Labs today We will only contact you if something comes back abnormal or we need to make some changes. Otherwise no news is good news!  Your physician recommends that you schedule a follow-up appointment in: 2 weeks with the pharmacy team and in 3 months  in the Advanced Practitioners (PA/NP) Clinic   Do the following things EVERYDAY: Weigh yourself in the morning before breakfast. Write it down and keep it in a log. Take your medicines as prescribed Eat low salt foods--Limit salt (sodium) to 2000 mg per day.  Stay as active as you can everyday Limit all fluids for the day to less than 2 liters  At the [facility], you and your health needs are our priority. As part of our continuing mission to provide you with exceptional heart care, we have created designated Provider Care Teams. These Care Teams include your primary Cardiologist (physician) and Advanced Practice Providers (APPs- Physician Assistants and Nurse Practitioners) who all work together to provide you with the care you need, when you need it.   You may see any of the following providers on your designated Care Team at your next follow up: Dr Glori Bickers Dr Loralie Champagne Dr. Roxana Hires, NP Lyda Jester, [place] [facility] [place], [place] Forestine Na, NP Audry Riles, PharmD   Please be sure to bring in all your medications bottles to every appointment.   If you have any questions or concerns before your next appointment please send [place] a message through [place] or call our office at [phone].    TO LEAVE A MESSAGE FOR THE NURSE SELECT OPTION 2, PLEASE LEAVE A MESSAGE INCLUDING: YOUR NAME DATE OF BIRTH CALL BACK NUMBER REASON FOR CALL**this is important as we prioritize the call backs  YOU WILL RECEIVE A CALL BACK THE SAME DAY AS LONG AS YOU CALL BEFORE 4:00 PM

## 2022-06-04 NOTE — Progress Notes (Signed)
H&V Care Navigation CSW Progress Note  Clinical Social Worker consulted to meet with pt regarding medication costs concerns.  Pt has been unable to work recently- reports that he has no financial concerns other than paying for medication copays.  Per clinic staff copays will be about $30- CSW provided $50 in gift cards to assist with costs.  Pt will reach out if affording medications is a problem again in the future.   SDOH Screenings   Food Insecurity: No Food Insecurity (05/09/2021)  Housing: Low Risk  (05/09/2021)  Transportation Needs: Unmet Transportation Needs (05/09/2021)  Alcohol Screen: Low Risk  (05/09/2021)  Depression (PHQ2-9): Low Risk  (05/11/2021)  Financial Resource Strain: High Risk (06/04/2022)  Tobacco Use: High Risk (09/22/2021)     Jorge Ny, LCSW Clinical Social Worker Advanced Heart Failure Clinic Desk#: [phone] Cell#: [phone]

## 2022-06-04 NOTE — Addendum Note (Signed)
Encounter addended by: Jorge Ny, LCSW on: 06/04/2022 9:49 AM  Actions taken: Flowsheet accepted, Clinical Note Signed

## 2022-06-15 ENCOUNTER — Other Ambulatory Visit (HOSPITAL_COMMUNITY): Payer: Self-pay

## 2022-06-27 ENCOUNTER — Other Ambulatory Visit (HOSPITAL_COMMUNITY): Payer: Self-pay

## 2022-06-27 NOTE — Progress Notes (Incomplete)
***In Progress***    Advanced Heart Failure Clinic Note   PCP: [facility] Cardiologist: Dr. Haroldine Laws  HPI:   Joseph Reese is a 57 y.o. male w/ h/o chronic systolic heart failure, first diagnosed in [place], h/o poor compliance, HTN, cocaine, ETOH and tobacco abuse. Previously uninsured.    Admitted to [facility] 04/2021 w/ acute on chronic HF. BP was markedly elevated on admit c/w hypertensive emergency. USD + for cocaine. HS trop 58>>32>>54>>65. Echo EF 15-20%, RV mildly reduced. Per chart, records from OSH showed EF previously 25-30%. He was diuresed and placed on GDMT. Given concern for noncompliance, RHC/LHC deferred to the out-patient setting to ensure he is following up regularly and is compliant with medications, as would need to be compliant with DAPT if PCI needed. Referred to [facility] clinic. D/c Wt 213 lb.    Seen in [facility] 05/19/21, no further drug use and cut down on ETOH. Strong CAD family history, 3 brothers died from MIs, ages 57, 32 and 38 y/o. Entresto started. Referred to the [facility].    F/u echo 09/2021 showing interval improvement in LVEF, up to 35-40%. RV moderately reduced, RVSP nl at 20 mmHg.    Today he returns for HF follow up. 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 No meds since 05/27/22. Feels ok. Watching his intake. Has lost 50 pounds. No CP, SOB, orthopnea or PND.   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 He was feeling good and had lost weight. His medications were restarted at that time.  Today he returns to HF clinic for pharmacist medication titration. At last visit with MD ***.   Overall feeling ***. Dizziness, lightheadedness, fatigue:  Chest pain or palpitations:  How is your breathing?: *** SOB: Able to complete all ADLs. Activity level ***  Weight at home pounds. Takes furosemide/torsemide/bumex *** mg *** daily.   LEE PND/Orthopnea  Appetite *** Low-salt diet:   Physical Exam Cost/affordability of meds   HF Medications: Carvedilol 12.5 BID Entresto 97/103 BID Spironolactone 25 mg daily Dapagliflozin 10 mg daily Furosemide 40 mg prn  Has the patient been experiencing any side effects to the medications prescribed?  {YES NO:22349}  Does the patient have any problems obtaining medications due to transportation or finances?   {YES NO:22349}  Understanding of regimen: {excellent/good/fair/poor:19665} Understanding of indications: {excellent/good/fair/poor:19665} Potential of compliance: {excellent/good/fair/poor:19665} Patient understands to avoid NSAIDs. Patient understands to avoid decongestants.    Pertinent Lab Values (06/04/22): Serum creatinine 0.8, BUN 7, Potassium 3.1, Sodium 137, BNP 88.6, A1c 4.7   Vital Signs: Weight: *** (last clinic weight: 179 lbs) Blood pressure: ***  Heart rate: ***   Assessment/Plan:  1. Chronic Systolic Heart Failure - Echo 04/2021 EF 15-20%, RV mildly reduced (no prior study for comparison, though records report prior EF ~25-30% at OSH). LHC initially deferred given h/o poor compliance and absence of CP  - Echo 09/2021: EF improved, 35-40%, RV mod reduced RVSP nl 20 mmHg - Etiology uncertain but suspect NICM given improving EF w/ medical therapy and reduction of ETOH consumption. 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 Has multiple risk factors for CAD but no h/o CP.   - NYHA Class 1. Euvolemic on exam. Out of all HF meds x 2-3 weeks - Volume ok - Continue losartan 50  mg daily - Continue spiro 25 mg daily.  - Continue Coreg 6.25 mg daily  - Off Farxiga and Entresto due to cost. See below regarding Farxiga - BP control and absence from cocaine and ETOH imperative. Reiterated this again today  - Labs today - Repeat echo at next visit   2. Hypertension  - Improved w/weight loss but still up today - restart prior  regimen, outlined above   3. HL - Continue atorva 80. - followed by PCP   4. Polysubstance Abuse - + history of cocaine, tobacco and ETOH use - UDS 12/22 + for cocaine.  - Abstinence imperative given CV disease.  - He has cut back on ETOH and tobacco, encouraged complete cessation.   5. DM2 - continue metformin - A1c at goal  Follow up ***  Audry Riles, PharmD, BCPS, BCCP, CPP Heart Failure Clinic Pharmacist [phone]

## 2022-06-28 ENCOUNTER — Ambulatory Visit (HOSPITAL_COMMUNITY)
Admission: RE | Admit: 2022-06-28 | Discharge: 2022-06-28 | Disposition: A | Discharge status: Home / Self Care | Payer: 59 | Source: Ambulatory Visit | Attending: Internal Medicine | Admitting: Internal Medicine

## 2022-06-28 ENCOUNTER — Other Ambulatory Visit (HOSPITAL_COMMUNITY): Payer: Self-pay

## 2022-06-28 DIAGNOSIS — Z91148 Patient's other noncompliance with medication regimen for other reason: Secondary | ICD-10-CM | POA: Diagnosis not present

## 2022-06-28 DIAGNOSIS — F1410 Cocaine abuse, uncomplicated: Secondary | ICD-10-CM | POA: Insufficient documentation

## 2022-06-28 DIAGNOSIS — Z8249 Family history of ischemic heart disease and other diseases of the circulatory system: Secondary | ICD-10-CM | POA: Diagnosis not present

## 2022-06-28 DIAGNOSIS — I110 Hypertensive heart disease with heart failure: Secondary | ICD-10-CM | POA: Insufficient documentation

## 2022-06-28 DIAGNOSIS — Z7984 Long term (current) use of oral hypoglycemic drugs: Secondary | ICD-10-CM | POA: Insufficient documentation

## 2022-06-28 DIAGNOSIS — F1910 Other psychoactive substance abuse, uncomplicated: Secondary | ICD-10-CM | POA: Insufficient documentation

## 2022-06-28 DIAGNOSIS — I5022 Chronic systolic (congestive) heart failure: Secondary | ICD-10-CM | POA: Diagnosis not present

## 2022-06-28 DIAGNOSIS — E118 Type 2 diabetes mellitus with unspecified complications: Secondary | ICD-10-CM | POA: Diagnosis not present

## 2022-06-28 DIAGNOSIS — E785 Hyperlipidemia, unspecified: Secondary | ICD-10-CM | POA: Diagnosis not present

## 2022-06-28 DIAGNOSIS — J810 Acute pulmonary edema: Secondary | ICD-10-CM | POA: Diagnosis not present

## 2022-06-28 DIAGNOSIS — Z79899 Other long term (current) drug therapy: Secondary | ICD-10-CM | POA: Insufficient documentation

## 2022-06-28 DIAGNOSIS — J9601 Acute respiratory failure with hypoxia: Secondary | ICD-10-CM | POA: Insufficient documentation

## 2022-06-28 DIAGNOSIS — I10 Essential (primary) hypertension: Secondary | ICD-10-CM | POA: Diagnosis not present

## 2022-06-28 MED ORDER — SPIRONOLACTONE 25 MG PO TABS
25.0000 mg | ORAL_TABLET | Freq: Every day | ORAL | 3 refills | Status: DC
  Filled 2022-06-28: qty 30, 30d supply, fill #0
  Filled 2022-12-27: qty 30, 30d supply, fill #1

## 2022-06-28 NOTE — Patient Instructions (Signed)
It was a pleasure seeing you today!  MEDICATIONS: -We are changing your medications today -Start spironolactone 25 mg daily -Call if you have questions about your medications.  LABS: -We will call you if your labs need attention.  NEXT APPOINTMENT: Return to clinic in 3/4 at 1 pm with pharmacy.  In general, to take care of your heart failure: -Limit your fluid intake to 2 Liters (half-gallon) per day.   -Limit your salt intake to ideally 2-3 grams (2000-3000 mg) per day. -Weigh yourself daily and record, and bring that "weight diary" to your next appointment.  (Weight gain of 2-3 pounds in 1 day typically means fluid weight.) -The medications for your heart are to help your heart and help you live longer.   -Please contact us before stopping any of your heart medications.  Call the clinic at [phone] with questions or to reschedule future appointments.

## 2022-06-28 NOTE — Progress Notes (Signed)
Advanced Heart Failure Clinic Note   PCP: [facility] Cardiologist: Dr. Haroldine Laws  HPI:   Joseph Reese is a 57 y.o. male w/ h/o chronic systolic heart failure, first diagnosed in [place], h/o poor compliance, HTN, cocaine, ETOH and tobacco abuse. Previously uninsured.    Admitted to [facility] 04/2021 w/ acute on chronic HF. BP was markedly elevated on admit c/w hypertensive emergency. USD + for cocaine. HS trop 58>>32>>54>>65. Echo EF 15-20%, RV mildly reduced. Per chart, records from OSH showed EF previously 25-30%. He was diuresed and placed on GDMT. Given concern for noncompliance, RHC/LHC deferred to the out-patient setting to ensure he is following up regularly and is compliant with medications, as would need to be compliant with DAPT if PCI needed. Referred to [facility] clinic. D/c Wt 213 lb.    Seen in [facility] 05/19/21, no further drug use and cut down on ETOH. Strong CAD family history, 3 brothers died from MIs, ages 53, 73 and 57 y/o. Entresto started. Referred to the [facility].    F/u echo 09/2021 showing interval improvement in LVEF, up to 35-40%. RV moderately reduced, RVSP nl at 20 mmHg.    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 He was feeling good and had lost weight. His medications were restarted at that time.  Today he returns to HF clinic for pharmacist medication titration. Overall, he is feeling good aside from a recent cold. Denies dizziness, lightheadedness, fatigue. Denies chest pain and palpitations. Denies SOB. Able to complete all ADLs. Remains active throughout the day. Weight at home measured last week was 182 pounds. Does not check regularly. Takes furosemide 40 mg prn, but has not required any recently. Denies LEE, PND, and Orthopnea. His appetite has been reduced with his cold and he has been eating chicken noodle soup for the past few days.   HF Medications: Carvedilol 12.5 mg  BID Dapagliflozin 10 mg daily Furosemide 40 mg prn  He is not taking any other medications.  Has the patient been experiencing any side effects to the medications prescribed?  No  Does the patient have any problems obtaining medications due to transportation or finances?   Yes. 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 He currently does not have any source of income and no money for medications or transportation. He is commercially insured with [employer] and has Medicaid as secondary. Entresto requires a PA for coverage, which was started this visit. He was given 4 bus passes today. He denies food insecurity.  Understanding of regimen: good Understanding of indications: good Potential of compliance:  Nonadherent to medications other than Farxiga and carvedilol  due to medication costs. He takes [place] and carvedilol every day without missing doses. Patient understands to avoid NSAIDs. Patient understands to avoid decongestants.    Pertinent Lab Values (06/04/22): Serum creatinine 0.8, BUN 7, Potassium 3.1, Sodium 137, BNP 88.6, A1c 4.7   Vital Signs: Weight: 172.2 lbs (last clinic weight: 179 lbs) Blood pressure: 124/82 mmHg  Heart rate: 82   Assessment/Plan:  1. Chronic Systolic Heart Failure - Echo 04/2021 EF 15-20%, RV mildly reduced (no prior study for comparison, though records report prior EF ~25-30% at OSH). LHC initially deferred given h/o poor compliance and absence of CP  - Echo 09/2021: EF improved, 35-40%, RV mod reduced RVSP nl 20 mmHg - Etiology uncertain but  suspect NICM given improving EF w/ medical therapy and reduction of ETOH consumption. 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 Has multiple risk factors for CAD but no h/o CP.   - NYHA Class II. Euvolemic on exam despite not taking spironolactone and Entresto. - Continue furosemide 40 mg prn. - Continue carvedilol 12.5 mg BID  - Restart spironolactone 25 mg daily today. Verified  copay of $0 and sent to [facility] outpatient pharmacy here on campus. BMET in 2 weeks. - Continue Farxiga 10 mg daily - Will consider starting Entresto at next visit after labs and PA approval. - BP control and absence from cocaine and ETOH imperative.   2. Hypertension  - Continues to improve w/ weight loss - Restart spironolactone as outlined above.   3. HL - Patient not taking atorvastatin. Prescription available at pharmacy. - Followed by PCP   4. Polysubstance Abuse - + history of cocaine, tobacco and ETOH use - UDS 04/2021 + for cocaine.  - Abstinence imperative given CV disease.  - He has cut back on ETOH and tobacco.   5. DM2 - Not taking metformin. Encouraged to pick up now that he has insurance coverage. - Recent A1c of 4.7 at goal.  Follow up in 2 weeks with pharmacy clinic. BMET at that time.  Audry Riles, PharmD, BCPS, BCCP, CPP Heart Failure Clinic Pharmacist [phone]

## 2022-07-15 NOTE — Progress Notes (Incomplete)
***In Progress***    Advanced Heart Failure Clinic Note   PCP: [facility] Cardiologist: Dr. Haroldine Laws  HPI:   Joseph Reese is a 57 y.o. male w/ h/o chronic systolic heart failure, first diagnosed in [place], h/o poor compliance, HTN, cocaine, ETOH and tobacco abuse. Previously uninsured.    Admitted to [facility] 04/2021 w/ acute on chronic HF. BP was markedly elevated on admit c/w hypertensive emergency. USD + for cocaine. HS trop 58>>32>>54>>65. Echo EF 15-20%, RV mildly reduced. Per chart, records from OSH showed EF previously 25-30%. He was diuresed and placed on GDMT. Given concern for noncompliance, RHC/LHC deferred to the out-patient setting to ensure he is following up regularly and is compliant with medications, as would need to be compliant with DAPT if PCI needed. Referred to [facility] clinic. D/c wt 213 lb.    Seen in [facility] 05/19/21, no further drug use and cut down on ETOH. Strong CAD family history, 3 brothers died from MIs, ages 36, 34 and 70 y/o. Entresto started. Referred to the [facility].    F/u echo 09/2021 showing interval improvement in LVEF, up to 35-40%. RV moderately reduced, RVSP nl at 20 mmHg.    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 He was feeling good and had lost weight. His medications were restarted at that time.  Today he returns to HF clinic for pharmacist medication titration. At last visit with pharmacy, he was feeling fine, but was not taking his Entresto and spironolactone. Spironolactone was restarted. He was provided a gift card for his medications.  Overall feeling ***. Dizziness, lightheadedness, fatigue:  Chest pain or palpitations:  How is your breathing?: *** SOB: Able to complete all ADLs. Activity level ***  Weight at home pounds. Takes furosemide/torsemide/bumex *** mg *** daily.  LEE PND/Orthopnea  Appetite *** Low-salt diet:   Physical  Exam Cost/affordability of meds   HF Medications: Carvedilol 12.5 mg BID Dapagliflozin 10 mg daily Spironolactone 25 mg daily Furosemide 40 mg prn  Has the patient been experiencing any side effects to the medications prescribed?  {YES NO:22349}  Does the patient have any problems obtaining medications due to transportation or finances?   Yes. 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 He currently does not have any source of income and no money for medications or transportation. He is commercially insured with [employer] and has Medicaid as secondary. Entresto requires a PA for coverage, which was started this visit. He was given 4 bus passes today. He denies food insecurity.   Understanding of regimen: {excellent/good/fair/poor:19665} Understanding of indications: {excellent/good/fair/poor:19665} Potential of compliance:  Nonadherent to medications other than Farxiga and carvedilol  due to medication costs. He takes [place] and carvedilol every day without missing doses. Patient understands to avoid NSAIDs. Patient understands to avoid decongestants.    Pertinent Lab Values (06/04/22): Serum creatinine 0.8, BUN 7, Potassium 3.1, Sodium 137, BNP 88.6, A1c 4.7   Vital Signs: Weight: *** (last clinic weight: 172.2 lbs) Blood pressure: ***  Heart rate: ***   Assessment/Plan: 1. Chronic Systolic Heart Failure - Echo 04/2021 EF 15-20%, RV mildly reduced (no prior study for comparison, though records report prior EF ~25-30% at OSH). LHC initially deferred given h/o poor compliance and absence of CP  - Echo 09/2021: EF improved, 35-40%, RV mod reduced RVSP nl 20 mmHg - Etiology uncertain but suspect  NICM given improving EF w/ medical therapy and reduction of ETOH consumption. 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 Has multiple risk factors for CAD but no h/o CP.   - NYHA Class II. Euvolemic on exam despite not taking spironolactone and Entresto. - Continue  furosemide 40 mg prn. - Continue carvedilol 12.5 mg BID  - Restart spironolactone 25 mg daily today. Verified copay of $0 and sent to [facility] outpatient pharmacy here on campus. BMET in 2 weeks. - Continue Farxiga 10 mg daily - Will consider starting Entresto at next visit after labs and PA approval. - BP control and absence from cocaine and ETOH imperative.   2. Hypertension  - Continues to improve w/ weight loss - Restart spironolactone as outlined above.   3. HL - Patient not taking atorvastatin. Prescription available at pharmacy. - Followed by PCP   4. Polysubstance Abuse - + history of cocaine, tobacco and ETOH use - UDS 04/2021 + for cocaine.  - Abstinence imperative given CV disease.  - He has cut back on ETOH and tobacco.   5. DM2 - Not taking metformin. Encouraged to pick up now that he has insurance coverage. - Recent A1c of 4.7 at goal.  Follow up ***   Audry Riles, PharmD, BCPS, BCCP, CPP Heart Failure Clinic Pharmacist [phone]

## 2022-07-16 ENCOUNTER — Ambulatory Visit (HOSPITAL_COMMUNITY)
Admission: RE | Admit: 2022-07-16 | Discharge: 2022-07-16 | Disposition: A | Discharge status: Home / Self Care | Payer: 59 | Source: Ambulatory Visit | Attending: Cardiology | Admitting: Cardiology

## 2022-07-16 ENCOUNTER — Other Ambulatory Visit (HOSPITAL_COMMUNITY): Payer: Self-pay

## 2022-07-16 ENCOUNTER — Telehealth (HOSPITAL_COMMUNITY): Payer: Self-pay | Admitting: Pharmacist

## 2022-07-16 VITALS — BP 124/78 | HR 82 | Wt 169.0 lb

## 2022-07-16 DIAGNOSIS — I110 Hypertensive heart disease with heart failure: Secondary | ICD-10-CM | POA: Insufficient documentation

## 2022-07-16 DIAGNOSIS — F17210 Nicotine dependence, cigarettes, uncomplicated: Secondary | ICD-10-CM | POA: Diagnosis not present

## 2022-07-16 DIAGNOSIS — Z79899 Other long term (current) drug therapy: Secondary | ICD-10-CM | POA: Diagnosis not present

## 2022-07-16 DIAGNOSIS — F1090 Alcohol use, unspecified, uncomplicated: Secondary | ICD-10-CM | POA: Insufficient documentation

## 2022-07-16 DIAGNOSIS — Z7984 Long term (current) use of oral hypoglycemic drugs: Secondary | ICD-10-CM | POA: Diagnosis not present

## 2022-07-16 DIAGNOSIS — F1910 Other psychoactive substance abuse, uncomplicated: Secondary | ICD-10-CM | POA: Diagnosis not present

## 2022-07-16 DIAGNOSIS — I5022 Chronic systolic (congestive) heart failure: Secondary | ICD-10-CM | POA: Insufficient documentation

## 2022-07-16 DIAGNOSIS — Z8249 Family history of ischemic heart disease and other diseases of the circulatory system: Secondary | ICD-10-CM | POA: Insufficient documentation

## 2022-07-16 DIAGNOSIS — F1490 Cocaine use, unspecified, uncomplicated: Secondary | ICD-10-CM | POA: Insufficient documentation

## 2022-07-16 DIAGNOSIS — Z91148 Patient's other noncompliance with medication regimen for other reason: Secondary | ICD-10-CM | POA: Insufficient documentation

## 2022-07-16 DIAGNOSIS — E785 Hyperlipidemia, unspecified: Secondary | ICD-10-CM | POA: Diagnosis not present

## 2022-07-16 LAB — BASIC METABOLIC PANEL
Anion gap: 13 (ref 5–15)
BUN: 5 mg/dL — ABNORMAL LOW (ref 6–20)
CO2: 24 mmol/L (ref 22–32)
Calcium: 9.2 mg/dL (ref 8.9–10.3)
Chloride: 100 mmol/L (ref 98–111)
Creatinine, Ser: 0.79 mg/dL (ref 0.61–1.24)
GFR, Estimated: >60 mL/min (ref >60)
Glucose, Bld: 123 mg/dL — ABNORMAL HIGH (ref 70–99)
Potassium: 2.8 mmol/L — ABNORMAL LOW (ref 3.5–5.1)
Sodium: 137 mmol/L (ref 135–145)

## 2022-07-16 MED ORDER — ENTRESTO 24-26 MG PO TABS
1.0000 | ORAL_TABLET | Freq: Two times a day (BID) | ORAL | 3 refills | Status: DC
  Filled 2022-07-16 – 2022-07-17 (×3): qty 60, 30d supply, fill #0

## 2022-07-16 NOTE — Patient Instructions (Signed)
It was a pleasure seeing you today!  MEDICATIONS: -We are changing your medications today -Start Entresto 24/26 mg twice daily -Call if you have questions about your medications.  LABS: -We will call you if your labs need attention.  NEXT APPOINTMENT: Return to clinic in 08/13/22 with pharmacy.  In general, to take care of your heart failure: -Limit your fluid intake to 2 Liters (half-gallon) per day.   -Limit your salt intake to ideally 2-3 grams (2000-3000 mg) per day. -Weigh yourself daily and record, and bring that "weight diary" to your next appointment.  (Weight gain of 2-3 pounds in 1 day typically means fluid weight.) -The medications for your heart are to help your heart and help you live longer.   -Please contact us before stopping any of your heart medications.  Call the clinic at [phone] with questions or to reschedule future appointments.

## 2022-07-16 NOTE — Telephone Encounter (Signed)
Attempted to call patient x 3 with no response. Unable to leave voicemail. Planning to start potassium 40 meq x 3 days, then start 20 meq daily thereafter with follow up labs in 2 weeks. Will send in potassium and scheduled labs after patient is contacted.

## 2022-07-16 NOTE — Progress Notes (Signed)
Advanced Heart Failure Clinic Note   PCP: [facility] Cardiologist: Dr. Haroldine Laws  HPI:  Joseph Reese is a 57 y.o. male w/ h/o chronic systolic heart failure, first diagnosed in [place], h/o poor compliance, HTN, cocaine, ETOH and tobacco abuse. Previously uninsured.    Admitted to [facility] 04/2021 w/ acute on chronic HF. BP was markedly elevated on admit c/w hypertensive emergency. USD + for cocaine. HS trop 58>>32>>54>>65. Echo EF 15-20%, RV mildly reduced. Per chart, records from OSH showed EF previously 25-30%. He was diuresed and placed on GDMT. Given concern for noncompliance, RHC/LHC deferred to the out-patient setting to ensure he is following up regularly and is compliant with medications, as would need to be compliant with DAPT if PCI needed. Referred to [facility] clinic. D/c wt 213 lb.    Seen in [facility] 05/19/21, no further drug use and cut down on ETOH. Strong CAD family history, 3 brothers died from MIs, ages 49, 18 and 70 y/o. Entresto started. Referred to the [facility].    F/u echo 09/2021 showing interval improvement in LVEF, up to 35-40%. RV moderately reduced, RVSP nl at 20 mmHg.    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 He was feeling good and had lost weight. His medications were restarted at that time.  At last visit with pharmacy, he was feeling fine, but was not taking his Entresto and spironolactone. Spironolactone was restarted. He was provided a gift card for his medications.  Today he returns to HF clinic for pharmacist medication titration. Reports overall feeling good. Denies dizziness, lightheadedness, and fatigue. Denies chest pain and palpitations. 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 Able to complete all other ADLs. Activity level moderate. Does not check weight at home regularly. Takes furosemide prn; has required 2 doses in the last 2  weeks. Denies PND, orthopnea, and LEE.  Appetite is good. Does not strictly adhere to low-salt diet. BP at home is generally 130s/80s per patient report.   HF Medications: Carvedilol 12.5 mg BID Dapagliflozin 10 mg daily Spironolactone 25 mg daily Furosemide 40 mg prn  Has the patient been experiencing any side effects to the medications prescribed?  No  Does the patient have any problems obtaining medications due to transportation or finances?   Yes. 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 His first paycheck will be on Friday. He is commercially insured with [employer] and has Medicaid as secondary. Entresto requires a PA for coverage.  Understanding of regimen: excellent Understanding of indications: excellent Potential of compliance:  Previously nonadherent to medications other than Farxiga and carvedilol  due to medication costs. He missed his AM meds today because he was eager to arrive at his first day back to work. He was able to fill spironolactone and has been taking all medications. Patient understands to avoid NSAIDs. Patient understands to avoid decongestants.     Pertinent Lab Values (06/04/22): Serum creatinine 0.8, BUN 7, Potassium 3.1, Sodium 137, BNP 88.6, A1c 4.7  Bmet today pending  Vital Signs: Weight: 169 lbs (last clinic weight: 172.2 lbs) Blood pressure: 124/78 mmHg  Heart rate: 82 bpm   Assessment/Plan: 1. Chronic Systolic Heart Failure - Echo 04/2021 EF 15-20%, RV mildly reduced (no prior study for comparison, though records report prior EF ~25-30% at OSH). LHC initially  deferred given h/o poor compliance and absence of CP  - Echo 09/2021: EF improved, 35-40%, RV mod reduced RVSP nl 20 mmHg - Etiology uncertain but suspect NICM given improving EF w/ medical therapy and reduction of ETOH consumption. 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 Has multiple risk factors for CAD but no h/o CP.   - NYHA Class II. Euvolemic on  exam. - BMP today pending - Continue furosemide 40 mg prn. - Continue carvedilol 12.5 mg BID  - Start Entresto 24/26 mg BID. BMP in ~3 weeks. - Continue spironolactone 25 mg daily. - Continue Farxiga 10 mg daily - BP control and absence from cocaine and ETOH imperative.   2. Hypertension  - Continues to improve w/ weight loss - BP controlled in clinic today, even without taking AM medications; however BP is often elevated at home. [facility] as outlined above.   3. HL - Patient not taking atorvastatin. Prescription available at pharmacy. - Followed by PCP   4. Polysubstance Abuse - + history of cocaine, tobacco and ETOH use - UDS 04/2021 + for cocaine.  - Abstinence imperative given CV disease.  - He has cut back on ETOH and tobacco.   5. DM2 - Not taking metformin. Encouraged to pick up now that he has insurance coverage. - Recent A1c of 4.7 at goal.  Follow up with pharmacy in 3-4 weeks. Will obtain BMP at that time.  Audry Riles, PharmD, BCPS, BCCP, CPP Heart Failure Clinic Pharmacist [phone]

## 2022-07-17 ENCOUNTER — Telehealth (HOSPITAL_COMMUNITY): Payer: Self-pay | Admitting: Pharmacy Technician

## 2022-07-17 ENCOUNTER — Other Ambulatory Visit (HOSPITAL_COMMUNITY): Payer: Self-pay

## 2022-07-17 NOTE — Telephone Encounter (Signed)
Advanced Heart Failure Patient Advocate Encounter  [employer] states, "We would like to inform you that we are unable to further process this request. Our records indicate that the patient has alternative pharmacy benefits."   Since the patient's commercial plan is not paying anything towards the [facility] claim, but allowing it to go through, Medicaid will not pick up as secondary.  Activated patient co-pay card and added to [facility]. Updated co-pay, $10.  Charlann Boxer, CPhT

## 2022-07-17 NOTE — Telephone Encounter (Signed)
Attempted to call the patient again. Unable to leave voicemail.

## 2022-07-17 NOTE — Telephone Encounter (Signed)
Patient Advocate Encounter   Received notification from [facility] [employer] that prior authorization for Delene Loll is required.   PA submitted on CoverMyMeds Key [id number] Status is pending   Will continue to follow.

## 2022-07-19 ENCOUNTER — Other Ambulatory Visit (HOSPITAL_COMMUNITY): Payer: Self-pay

## 2022-08-06 ENCOUNTER — Other Ambulatory Visit (HOSPITAL_COMMUNITY): Payer: Self-pay

## 2022-08-13 ENCOUNTER — Inpatient Hospital Stay (HOSPITAL_COMMUNITY)
Admission: RE | Admit: 2022-08-13 | Discharge: 2022-08-13 | Disposition: A | Discharge status: Home / Self Care | Payer: 59 | Source: Ambulatory Visit

## 2022-09-03 ENCOUNTER — Encounter (HOSPITAL_COMMUNITY): Payer: 59

## 2022-09-17 ENCOUNTER — Other Ambulatory Visit (HOSPITAL_COMMUNITY): Payer: Self-pay

## 2022-09-17 ENCOUNTER — Encounter (HOSPITAL_COMMUNITY): Payer: Self-pay

## 2022-09-17 ENCOUNTER — Ambulatory Visit (HOSPITAL_COMMUNITY)
Admission: RE | Admit: 2022-09-17 | Discharge: 2022-09-17 | Disposition: A | Discharge status: Home / Self Care | Payer: 59 | Source: Ambulatory Visit | Attending: Family Medicine | Admitting: Family Medicine

## 2022-09-17 VITALS — BP 144/98 | HR 79 | Wt 169.4 lb

## 2022-09-17 DIAGNOSIS — F17210 Nicotine dependence, cigarettes, uncomplicated: Secondary | ICD-10-CM | POA: Diagnosis not present

## 2022-09-17 DIAGNOSIS — Z833 Family history of diabetes mellitus: Secondary | ICD-10-CM | POA: Insufficient documentation

## 2022-09-17 DIAGNOSIS — E785 Hyperlipidemia, unspecified: Secondary | ICD-10-CM | POA: Diagnosis not present

## 2022-09-17 DIAGNOSIS — I5022 Chronic systolic (congestive) heart failure: Secondary | ICD-10-CM | POA: Insufficient documentation

## 2022-09-17 DIAGNOSIS — Z79899 Other long term (current) drug therapy: Secondary | ICD-10-CM | POA: Insufficient documentation

## 2022-09-17 DIAGNOSIS — Z5982 Transportation insecurity: Secondary | ICD-10-CM | POA: Diagnosis not present

## 2022-09-17 DIAGNOSIS — E119 Type 2 diabetes mellitus without complications: Secondary | ICD-10-CM | POA: Insufficient documentation

## 2022-09-17 DIAGNOSIS — F1910 Other psychoactive substance abuse, uncomplicated: Secondary | ICD-10-CM | POA: Diagnosis not present

## 2022-09-17 DIAGNOSIS — Z5986 Financial insecurity: Secondary | ICD-10-CM | POA: Insufficient documentation

## 2022-09-17 DIAGNOSIS — Z7984 Long term (current) use of oral hypoglycemic drugs: Secondary | ICD-10-CM | POA: Diagnosis not present

## 2022-09-17 DIAGNOSIS — Z8249 Family history of ischemic heart disease and other diseases of the circulatory system: Secondary | ICD-10-CM | POA: Insufficient documentation

## 2022-09-17 DIAGNOSIS — Z139 Encounter for screening, unspecified: Secondary | ICD-10-CM | POA: Diagnosis not present

## 2022-09-17 DIAGNOSIS — I10 Essential (primary) hypertension: Secondary | ICD-10-CM

## 2022-09-17 DIAGNOSIS — I110 Hypertensive heart disease with heart failure: Secondary | ICD-10-CM | POA: Diagnosis not present

## 2022-09-17 LAB — BASIC METABOLIC PANEL
Anion gap: 12 (ref 5–15)
BUN: 5 mg/dL — ABNORMAL LOW (ref 6–20)
CO2: 23 mmol/L (ref 22–32)
Calcium: 9.3 mg/dL (ref 8.9–10.3)
Chloride: 102 mmol/L (ref 98–111)
Creatinine, Ser: 0.73 mg/dL (ref 0.61–1.24)
GFR, Estimated: >60 mL/min (ref >60)
Glucose, Bld: 109 mg/dL — ABNORMAL HIGH (ref 70–99)
Potassium: 3.5 mmol/L (ref 3.5–5.1)
Sodium: 137 mmol/L (ref 135–145)

## 2022-09-17 LAB — BRAIN NATRIURETIC PEPTIDE: B Natriuretic Peptide: 106.5 pg/mL — ABNORMAL HIGH (ref 0.0–100.0)

## 2022-09-17 MED ORDER — FUROSEMIDE 40 MG PO TABS
40.0000 mg | ORAL_TABLET | ORAL | 4 refills | Status: DC | PRN
  Filled 2022-09-17 – 2022-12-27 (×2): qty 30, 30d supply, fill #0

## 2022-09-17 MED ORDER — CARVEDILOL 12.5 MG PO TABS
12.5000 mg | ORAL_TABLET | Freq: Two times a day (BID) | ORAL | 3 refills | Status: DC
  Filled 2022-09-17 – 2022-12-27 (×2): qty 60, 30d supply, fill #0

## 2022-09-17 MED ORDER — ATORVASTATIN CALCIUM 80 MG PO TABS
80.0000 mg | ORAL_TABLET | Freq: Every day | ORAL | 3 refills | Status: DC
  Filled 2022-09-17 – 2022-12-27 (×2): qty 30, 30d supply, fill #0

## 2022-09-17 MED ORDER — DAPAGLIFLOZIN PROPANEDIOL 10 MG PO TABS
10.0000 mg | ORAL_TABLET | Freq: Every day | ORAL | 11 refills | Status: DC
  Filled 2022-09-17 – 2022-12-27 (×2): qty 30, 30d supply, fill #0

## 2022-09-17 MED ORDER — ATORVASTATIN CALCIUM 10 MG PO TABS
10.0000 mg | ORAL_TABLET | Freq: Every day | ORAL | 3 refills | Status: DC
  Filled 2022-09-17: qty 90, 90d supply, fill #0

## 2022-09-17 MED ORDER — ENTRESTO 24-26 MG PO TABS
1.0000 | ORAL_TABLET | Freq: Two times a day (BID) | ORAL | 3 refills | Status: DC
  Filled 2022-09-17 – 2022-12-27 (×2): qty 60, 30d supply, fill #0

## 2022-09-17 NOTE — Patient Instructions (Addendum)
Re-start Farxiga 10 mg daily. Re-start Lipitor 80 mg daily. Re-start Entresto 24/26 mg twice daily. Labs today - will call you if abnormal. Repeat labs in two weeks - see below. Refills sent on your heart medications as requested. Return to see Dr. Gala Romney with echo in 2  - 3 months - see below. Please call us if any questions or concerns prior to your next visit at [phone].

## 2022-09-17 NOTE — Progress Notes (Signed)
ADVANCED HF CLINIC NOTE  PCP: [facility] Cardiologist: Dr. Gala Romney  HPI: Joseph Reese is a 57 y.o. male w/ h/o chronic systolic heart failure, first diagnosed in [place], h/o poor compliance, HTN, cocaine, ETOH and tobacco abuse. Previously uninsured.    Admitted to [facility] 12/22 w/ acute on chronic HF. BP was markedly elevated on admit c/w hypertensive emergency. USD + for cocaine. HS trop 58>>32>>54>>65. Echo EF 15-20%, RV mildly reduced. Per chart, records from OSH showed EF previously 25-30%. He was diuresed and placed on GDMT. Given concern for noncompliance, RHC/LHC deferred to the out-patient setting to ensure he is following up regularly and is compliant with medications, as would need to be compliant with DAPT if PCI needed. Referred to [facility] clinic. D/c Wt 213 lb.   Seen in [facility] 05/19/21, no further drug use and cut down on ETOH. Strong CAD family history, 3 brothers died from MIs, ages 38, 90 and 59 y/o. Entresto started. Referred to the [facility].   Echo 5/23 showing interval improvement in LVEF, up to 35-40%. RV moderately reduced, RVSP nl at 20 mmHg.   Follow up 1/24,NYHA I and volume ok. Out of most GDMT. Meds restarted with plans to repeat echo.  Today he returns for HF follow up. Overall feeling fine. He does not have SOB walking up steps. Denies palpitations, CP, dizziness, edema, or PND/Orthopnea. Appetite ok. No fever or chills. Weight at home 172 pounds. Out of most meds, ran out. Drinks 1 beer/day, smokes 1-2 cigs/day, used cocaine a couple weeks ago. Works as a Designer, fashion/clothing, lives with GF, cell phone broken. He uses bus for transportation.  Cardiac Testing  - Echo (5/23): EF 35-40%, RV mod reduced, normal RVSP 20 mmHg   - Echo (12/22): EF 15-20%, severe LV dysfunction, moderate LVH, RV mildly down.   Past Medical History:  Diagnosis Date   CHF (congestive heart failure), NYHA class II (HCC)    Cocaine abuse (HCC)    Diabetes mellitus without complication (HCC)    ETOH  abuse    Hypertension    Tobacco abuse    Current Outpatient Medications  Medication Sig Dispense Refill   carvedilol (COREG) 12.5 MG tablet Take 1 tablet (12.5 mg total) by mouth 2 (two) times daily with a meal. 60 tablet 3   furosemide (LASIX) 40 MG tablet Take 1 tablet (40 mg total) by mouth as needed for fluid or edema. 30 tablet 4   sacubitril-valsartan (ENTRESTO) 24-26 MG Take 1 tablet by mouth 2 (two) times daily. 180 tablet 3   spironolactone (ALDACTONE) 25 MG tablet Take 1 tablet (25 mg total) by mouth daily. 30 tablet 3   atorvastatin (LIPITOR) 80 MG tablet Take 1 tablet (80 mg total) by mouth daily. (Patient not taking: Reported on 09/17/2022) 30 tablet 3   dapagliflozin propanediol (FARXIGA) 10 MG TABS tablet Take 1 tablet (10 mg total) by mouth daily before breakfast. (Patient not taking: Reported on 09/17/2022) 60 tablet 3   No current facility-administered medications for this encounter.   No Known Allergies  Social History   Socioeconomic History   Marital status: Single    Spouse name: Not on file   Number of children: 4   Years of education: Not on file   Highest education level: High school graduate  Occupational History   Occupation: unemployed    Comment: contract roofer--unable to work since 12/2020, d/t physical  Tobacco Use   Smoking status: Every Day    Packs/day: 0.50  Years: 40.00    Additional pack years: 0.00    Total pack years: 20.00    Types: Cigarettes   Smokeless tobacco: Never  Vaping Use   Vaping Use: Never used  Substance and Sexual Activity   Alcohol use: Yes    Alcohol/week: 18.0 standard drinks of alcohol    Types: 12 Cans of beer, 6 Shots of liquor per week    Comment: 40 years of consistent   Drug use: Yes    Frequency: 1.0 times per week    Types: Cocaine    Comment: last use 12/21, 3-4x/mo   Sexual activity: Not on file  Other Topics Concern   Not on file  Social History Narrative   Not on file   Social Determinants of  Health   Financial Resource Strain: High Risk (06/04/2022)   Overall Financial Resource Strain (CARDIA)    Difficulty of Paying Living Expenses: Hard  Food Insecurity: No Food Insecurity (05/09/2021)   Hunger Vital Sign    Worried About Running Out of Food in the Last Year: Never true    Ran Out of Food in the Last Year: Never true  Transportation Needs: Unmet Transportation Needs (05/09/2021)   PRAPARE - Administrator, Civil Service (Medical): Yes    Lack of Transportation (Non-Medical): Yes  Physical Activity: Not on file  Stress: Not on file  Social Connections: Not on file  Intimate Partner Violence: Not on file   Family History  Problem Relation Age of Onset   Diabetes Mellitus II Brother    Coronary artery disease Brother 51       MI   Coronary artery disease Brother 18       MI   Coronary artery disease Brother 54       MI   BP (!) 144/98   Pulse 79   Wt 76.8 kg (169 lb 6.4 oz)   SpO2 99%   BMI 25.02 kg/m   Wt Readings from Last 3 Encounters:  09/17/22 76.8 kg (169 lb 6.4 oz)  07/16/22 76.7 kg (169 lb)  06/28/22 78.1 kg (172 lb 3.2 oz)   PHYSICAL EXAM: General:  NAD. No resp difficulty, walked into clinic HEENT: Normal Neck: Supple. No JVD. Carotids 2+ bilat; no bruits. No lymphadenopathy or thryomegaly appreciated. Cor: PMI nondisplaced. Regular rate & rhythm. No rubs, gallops or murmurs. Lungs: Clear Abdomen: Soft, nontender, nondistended. No hepatosplenomegaly. No bruits or masses. Good bowel sounds. Extremities: No cyanosis, clubbing, rash, edema Neuro: Alert & oriented x 3, cranial nerves grossly intact. Moves all 4 extremities w/o difficulty. Affect pleasant.  ASSESSMENT & PLAN:  1. Chronic Systolic Heart Failure - Echo (12/22): EF 15-20%, RV mildly reduced (no prior study for comparison, though records report prior EF ~25-30% at OSH).  - LHC initially deferred given h/o poor compliance and absence of CP  - Echo (5/23): EF improved,  35-40%, RV mod reduced  - Etiology uncertain but suspect NICM given improving EF w/ medical therapy and reduction of ETOH consumption. 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 Has multiple risk factors for CAD but no h/o CP.   - NYHA I, volume looks good today. Uses Lasix 40 PRN. - Restart Farxiga 10 mg daily. - Restart Entresto 24/26 mg bid. - Continue spiro 25 mg daily.  - Continue Coreg 12.5 mg daily  - Labs today - Repeat echo soon. Long discussion about BP control and absence from cocaine/ETOH.  2. Hypertension  - BP elevated. - Restart meds as above.  3. HL - Restart atorva 80. - Followed by PCP   4. Polysubstance Abuse - + history of cocaine, tobacco and ETOH use - Used cocaine 2 weeks ago. Discussed plan to stop. - Abstinence imperative given CV disease.  - He has cut back on ETOH and tobacco, encouraged complete cessation.  5. DM2 - Continue metformin - Restart Farxiga as above.  6. SDOH - Engage HFSW to help with obtaining phone. - GF # updated in chart so we can get in touch with him - He is insured.  Follow up in 2-3 months with Dr. Gala Romney + echo.  Jacklynn Ganong, FNP  10:33 AM

## 2022-09-28 ENCOUNTER — Other Ambulatory Visit (HOSPITAL_COMMUNITY): Payer: Self-pay

## 2022-10-01 ENCOUNTER — Other Ambulatory Visit (HOSPITAL_COMMUNITY): Payer: 59

## 2022-12-27 ENCOUNTER — Other Ambulatory Visit: Payer: Self-pay

## 2022-12-27 ENCOUNTER — Other Ambulatory Visit (HOSPITAL_COMMUNITY): Payer: Self-pay

## 2022-12-28 IMAGING — CR DG CHEST 2V
2 series · 2 of 2 positions shown · non-contrast
Comparison: None.

CLINICAL DATA: Cough and difficulty breathing.

EXAM:
CHEST - 2 VIEW

[chest pa]
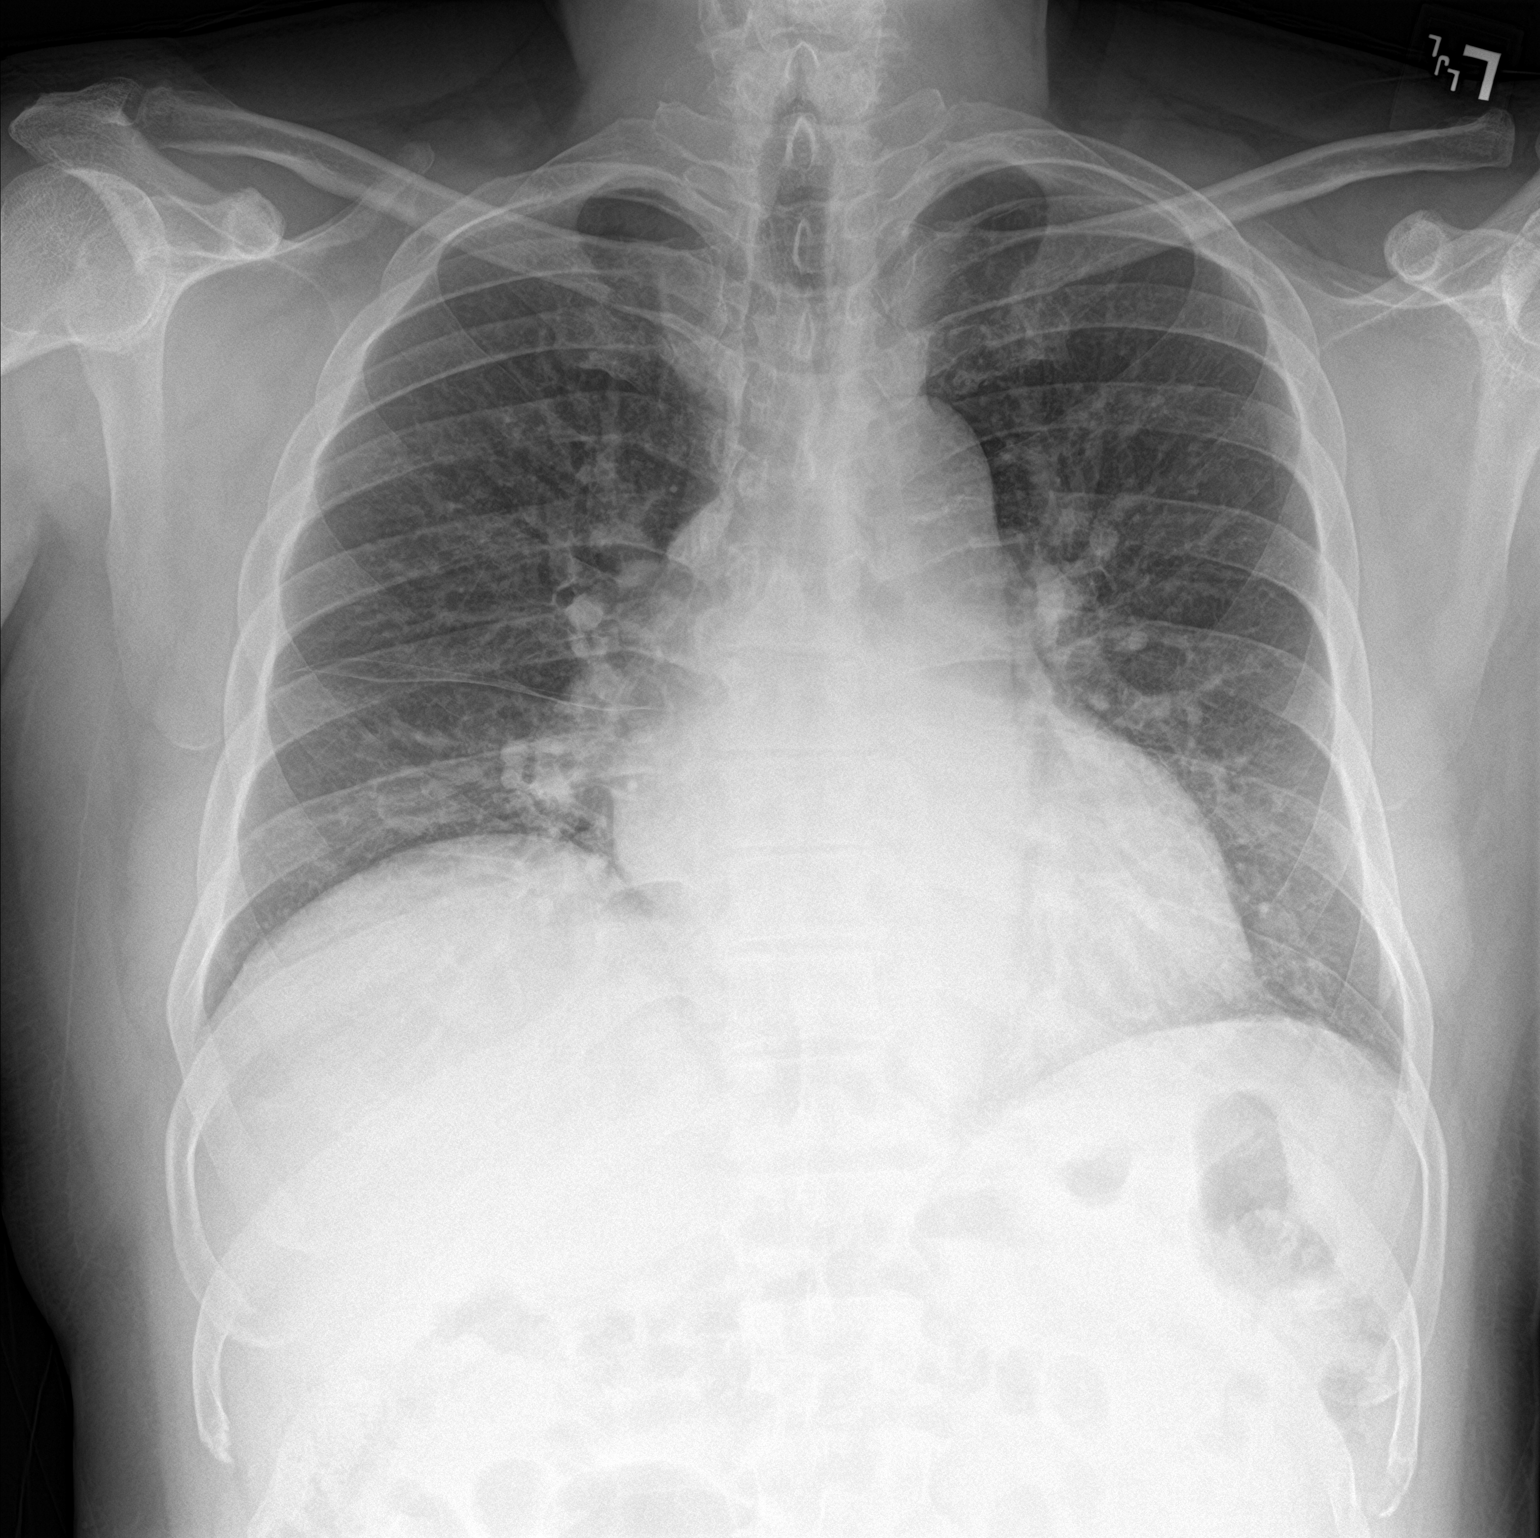

[chest lat]
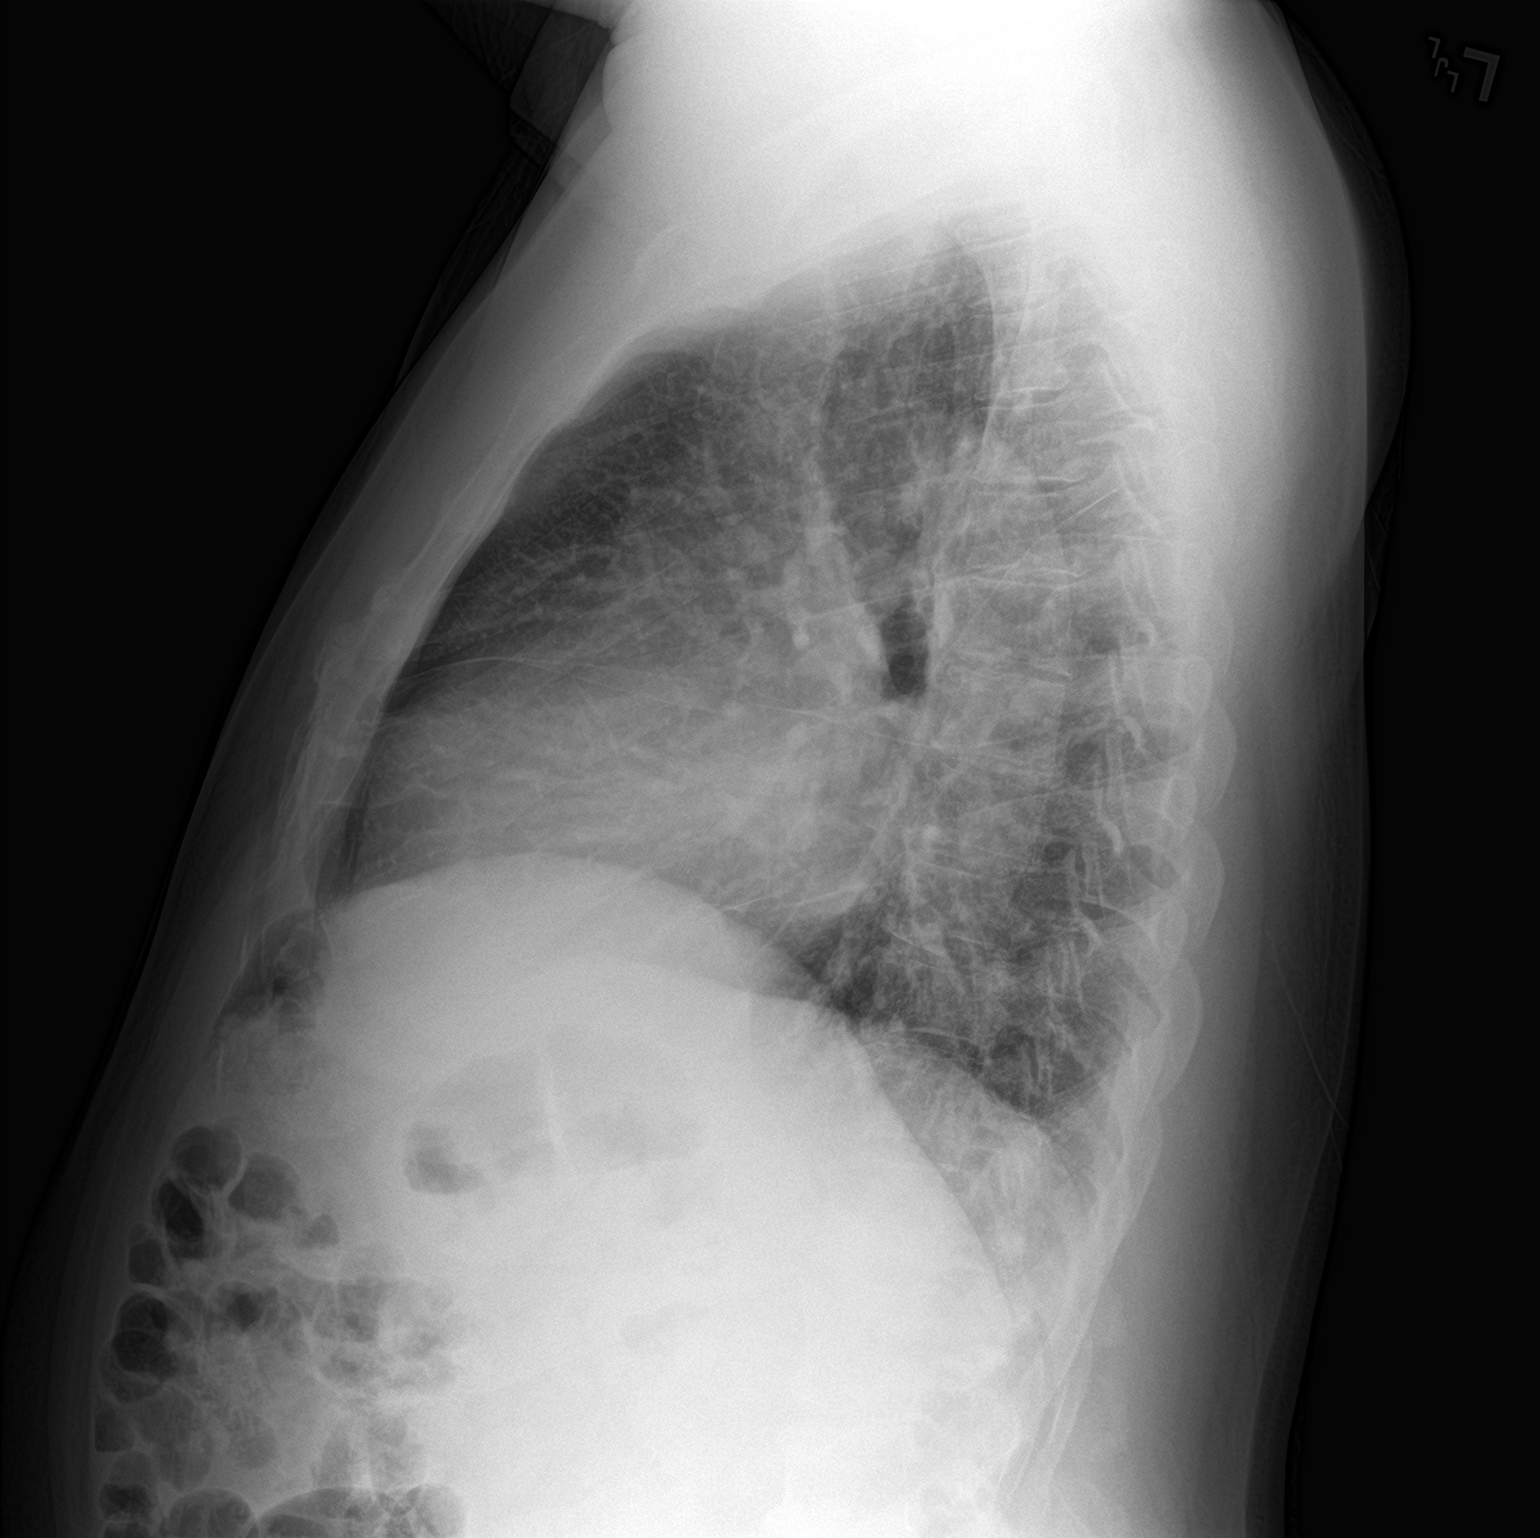

[2 of 2 positions shown; findings below may reference images not displayed]

FINDINGS: Low lung volumes are seen with subsequent crowding of the bibasilar
bronchovascular lung markings. Very mild bibasilar atelectasis is
also noted. There is no evidence of acute infiltrate, pleural
effusion or pneumothorax. The heart size and mediastinal contours
are within normal limits. The visualized skeletal structures are
unremarkable.
IMPRESSION: Low lung volumes without acute or active cardiopulmonary disease.

## 2022-12-29 IMAGING — CT CT NECK W/ CM
5 of 6 series · 14 of 33 positions shown, 16 images · IV contrast (APPLIED)
Comparison: None.

CLINICAL DATA: Neck deep tissue abscess.  Hoarseness.  Neck pain

EXAM:
CT NECK WITH CONTRAST
TECHNIQUE: Multidetector CT imaging of the neck was performed using the
standard protocol following the bolus administration of intravenous
contrast.
CONTRAST:  75mL OMNIPAQUE IOHEXOL 300 MG/ML  SOLN

[Series 3: axial neck · axial · 0.61mm/px · z∈[-342,-262]mm · 2 of 121 slices shown, 3 images]
[im 41/121  soft-tissue]
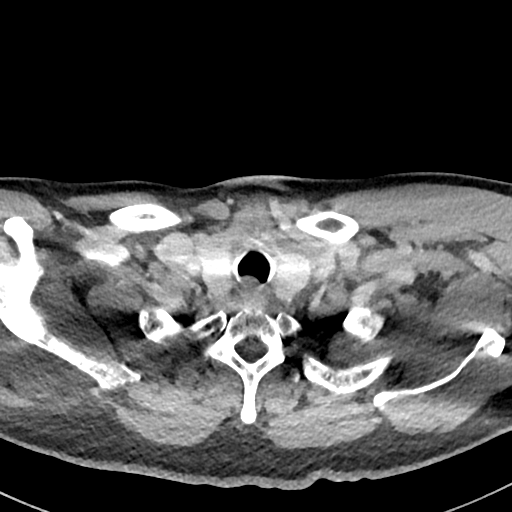
[im 41/121  bone]
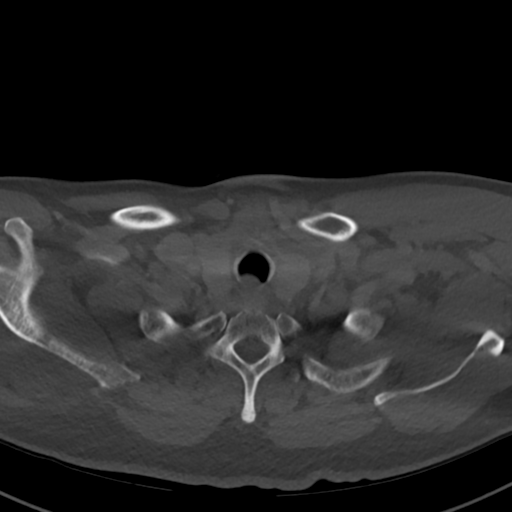
[im 81/121  bone]
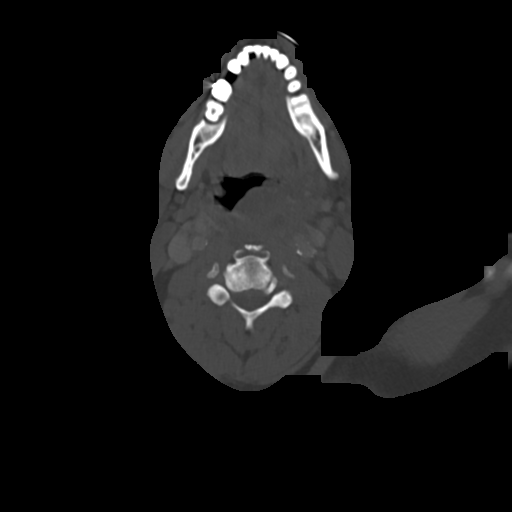

[Series 5: axial bone · axial · 0.61mm/px · z∈[-342,-262]mm · 2 of 121 slices shown]
[im 41/121  bone]
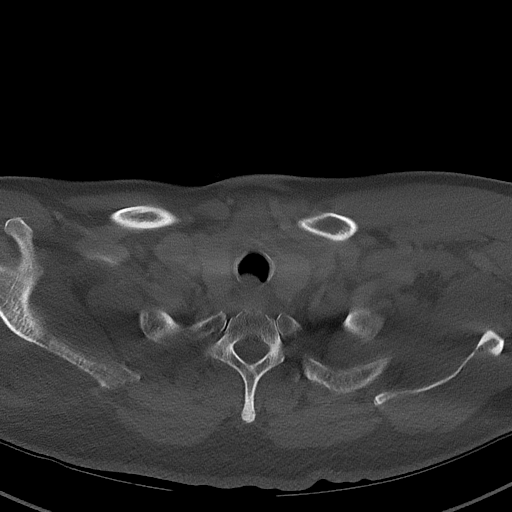
[im 81/121  bone]
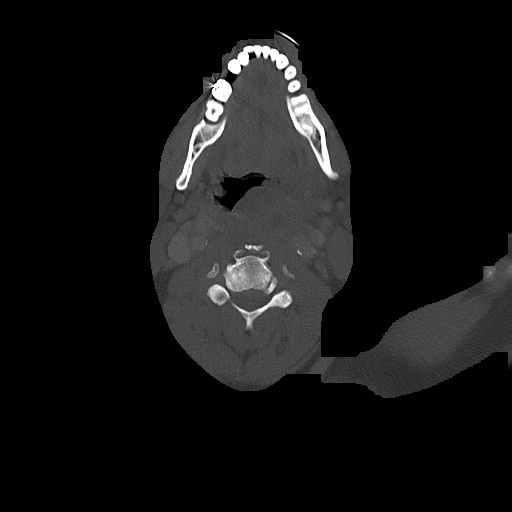

[Series 6: sag neck · sagittal · 0.52mm/px · 5 of 131 slices shown, 6 images]
[im 44/131  bone]
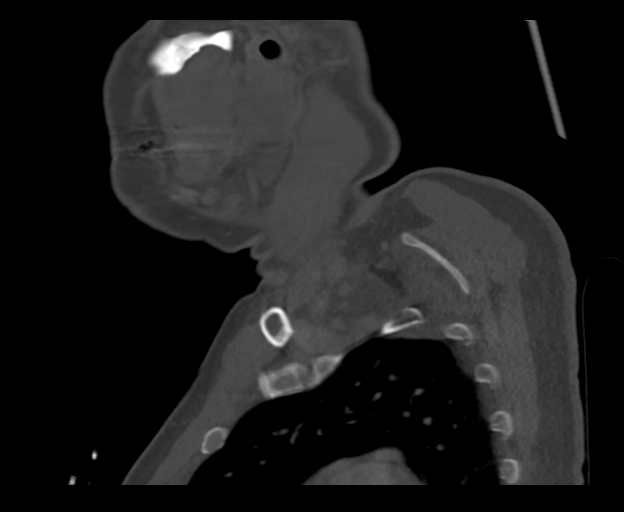
[im 55/131  bone]
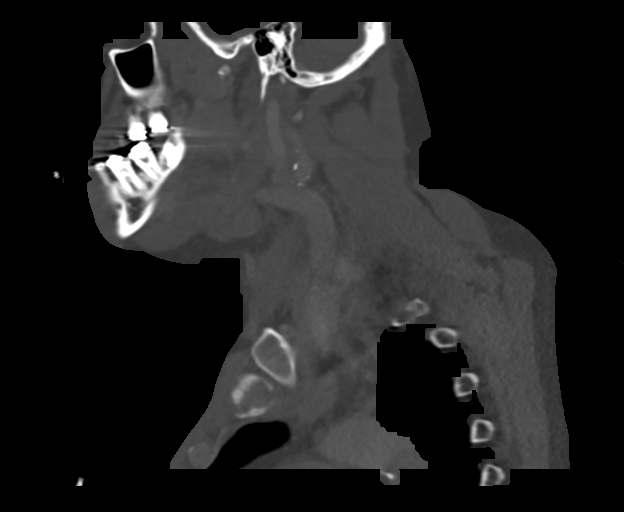
[im 66/131  soft-tissue]
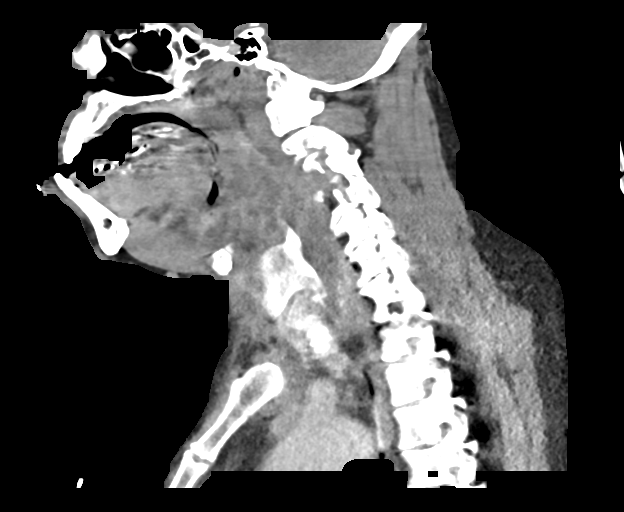
[im 66/131  bone]
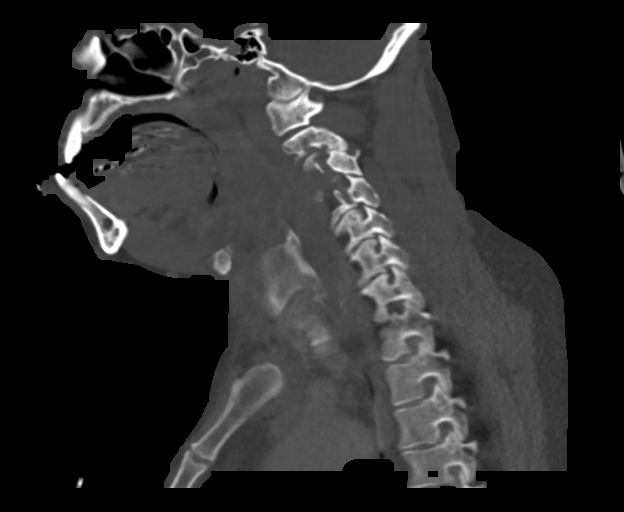
[im 76/131  bone]
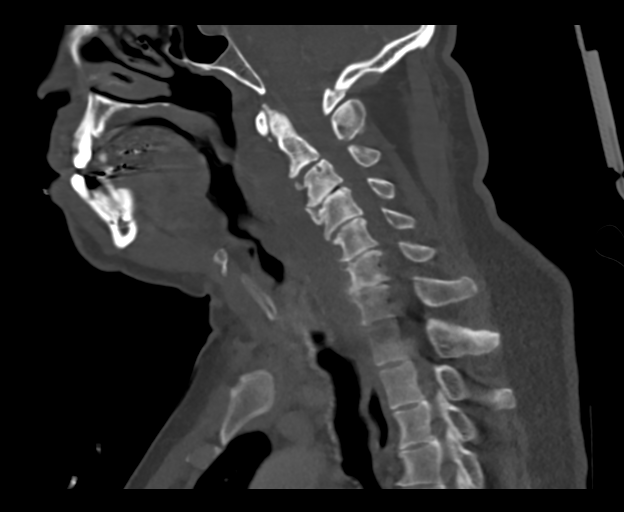
[im 87/131  bone]
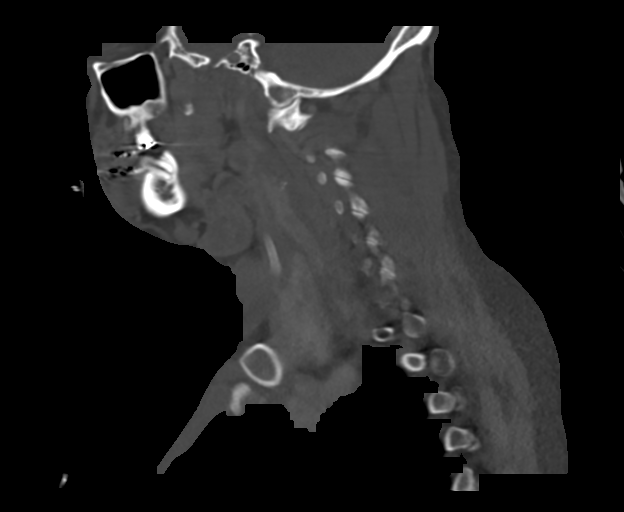

[Series 7: cor neck · coronal · 0.50mm/px · 3 of 135 slices shown]
[im 27/135  bone]
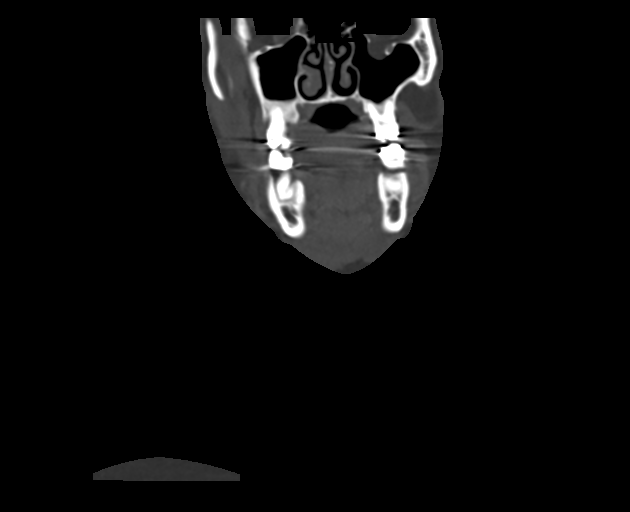
[im 54/135  bone]
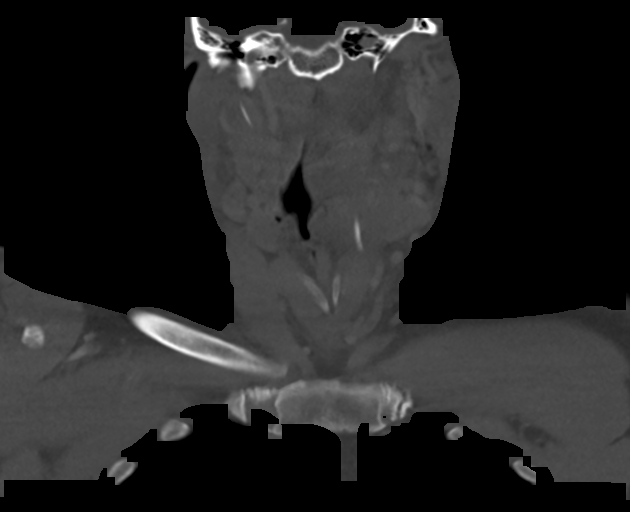
[im 81/135  bone]
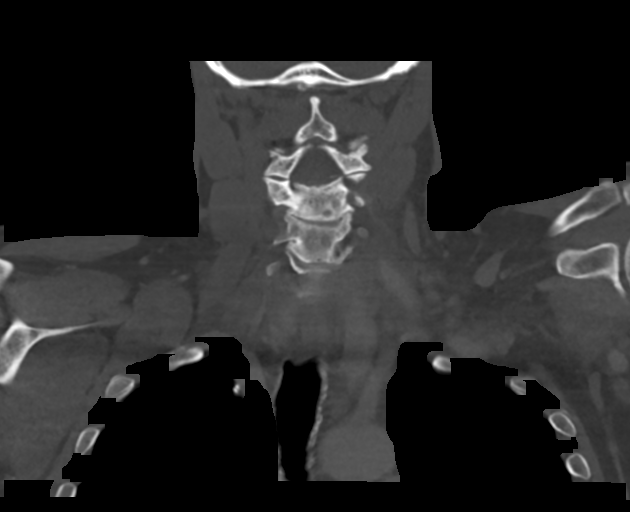

[Series 8: ax oropharynx · axial · 0.57mm/px · z∈[-350,-268]mm · 2 of 124 slices shown]
[im 42/124  bone]
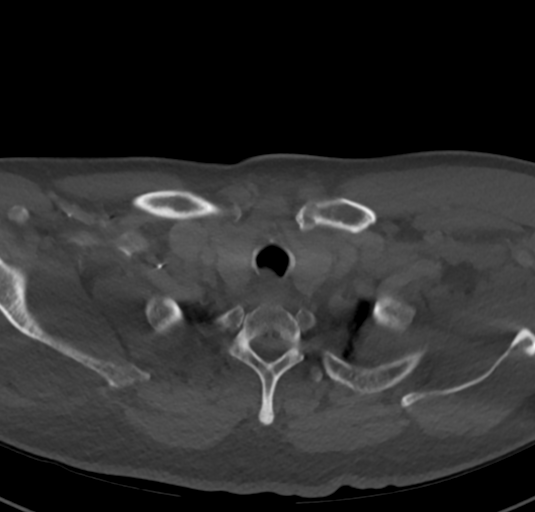
[im 83/124  bone]
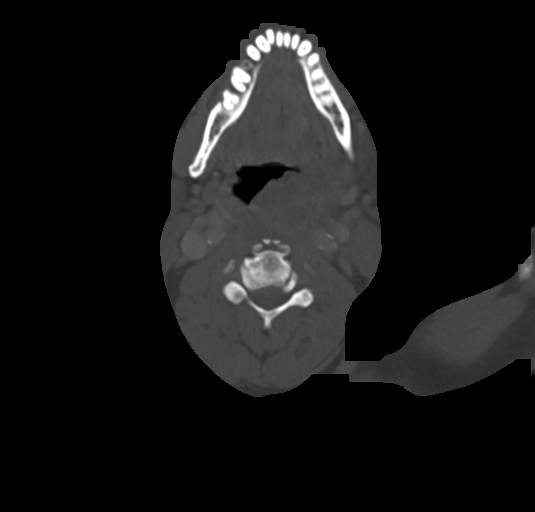

[14 of 33 positions shown; findings below may reference images not displayed]

FINDINGS: Pharynx and larynx: Extensive mucosal and submucosal edema involving
the left lateral pharyngeal wall and tonsil. This extends down to
the piriform sinus. Within the edema there are 2 small areas of
fluid density compatible with small abscesses measuring
approximately 10 mm each. Airway is displaced to the right. Mild
narrowing of the supraglottic airway.

Salivary glands: No inflammation, mass, or stone.

Thyroid: Negative

Lymph nodes: No enlarged or necrotic lymph nodes in the neck.

Vascular: Normal vascular enhancement.

Limited intracranial: Negative

Visualized orbits: Fracture left orbital floor which appears
chronic.

Mastoids and visualized paranasal sinuses: Mild mucosal edema right
ethmoid sinuses. Remaining sinuses clear. Mastoid clear.

Skeleton: Cervical spondylosis causing spinal and foraminal stenosis
at multiple levels. No acute skeletal abnormality.

Upper chest: Lung apices clear bilaterally.

Other: None
IMPRESSION: Extensive soft tissue edema in the left lateral pharynx. The airway
is displaced to the right and mildly narrowed. There are 2 small
areas of abscess formation within the parapharyngeal edema on the
left. Findings compatible with acute infection.

## 2023-01-01 NOTE — H&P (View-Only) (Signed)
ADVANCED HF CLINIC NOTE  PCP: [facility] Cardiologist: Dr. Gala Romney  HPI: Joseph Reese is a 57 y.o. male w/ h/o chronic systolic heart failure, first diagnosed in [place], h/o poor compliance, HTN, cocaine, ETOH and tobacco abuse. Previously uninsured.    Admitted to [facility] 12/22 w/ acute on chronic HF. BP was markedly elevated on admit c/w hypertensive emergency. USD + for cocaine. HS trop 58>>32>>54>>65. Echo EF 15-20%, RV mildly reduced. Per chart, records from OSH showed EF previously 25-30%. He was diuresed and placed on GDMT. Given concern for noncompliance, RHC/LHC deferred to the out-patient setting to ensure he is following up regularly and is compliant with medications, as would need to be compliant with DAPT if PCI needed. Referred to [facility] clinic. D/c Wt 213 lb.   Seen in [facility] 05/19/21, no further drug use and cut down on ETOH. Strong CAD family history, 3 brothers died from MIs, ages 20, 12 and 58 y/o. Entresto started. Referred to the [facility].   Echo 5/23 showing interval improvement in LVEF, up to 35-40%. RV moderately reduced, RVSP nl at 20 mmHg.   Follow up 1/24,NYHA I and volume ok. Out of most GDMT. Meds restarted with plans to repeat echo.  Today he returns for HF follow up. Feels ok. 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 Drinks a couple 40oz beers on the weekends. Smokes several cigs per day. Back hurts. Compliant with meds  Cardiac Testing  - Echo (5/23): EF 35-40%, RV mod reduced, normal RVSP 20 mmHg   - Echo (12/22): EF 15-20%, severe LV dysfunction, moderate LVH, RV mildly down.   Past Medical History:  Diagnosis Date   CHF (congestive heart failure), NYHA class II (HCC)    Cocaine abuse (HCC)    Diabetes mellitus without complication (HCC)    ETOH abuse    Hypertension    Tobacco abuse    Current Outpatient Medications  Medication Sig Dispense Refill   atorvastatin (LIPITOR) 80 MG tablet Take 1 tablet (80 mg total) by mouth daily. 90 tablet 3    carvedilol (COREG) 12.5 MG tablet Take 1 tablet (12.5 mg total) by mouth 2 (two) times daily with a meal. 60 tablet 3   dapagliflozin propanediol (FARXIGA) 10 MG TABS tablet Take 1 tablet (10 mg total) by mouth daily before breakfast. 30 tablet 11   furosemide (LASIX) 40 MG tablet Take 1 tablet (40 mg total) by mouth as needed for fluid or edema. 30 tablet 4   sacubitril-valsartan (ENTRESTO) 24-26 MG Take 1 tablet by mouth 2 (two) times daily. 180 tablet 3   spironolactone (ALDACTONE) 25 MG tablet Take 1 tablet (25 mg total) by mouth daily. 30 tablet 3   No current facility-administered medications for this encounter.   No Known Allergies  Social History   Socioeconomic History   Marital status: Single    Spouse name: Not on file   Number of children: 4   Years of education: Not on file   Highest education level: High school graduate  Occupational History   Occupation: unemployed    Comment: contract roofer--unable to work since 12/2020, d/t physical  Tobacco Use   Smoking status: Every Day    Current packs/day: 0.50    Average packs/day: 0.5 packs/day for 40.0 years (20.0 ttl pk-yrs)    Types: Cigarettes   Smokeless tobacco: Never  Vaping Use   Vaping status: Never Used  Substance and Sexual Activity   Alcohol use: Yes  Alcohol/week: 18.0 standard drinks of alcohol    Types: 12 Cans of beer, 6 Shots of liquor per week    Comment: 40 years of consistent   Drug use: Yes    Frequency: 1.0 times per week    Types: Cocaine    Comment: last use 12/21, 3-4x/mo   Sexual activity: Not on file  Other Topics Concern   Not on file  Social History Narrative   Not on file   Social Determinants of Health   Financial Resource Strain: High Risk (01/02/2023)   Overall Financial Resource Strain (CARDIA)    Difficulty of Paying Living Expenses: Hard  Food Insecurity: No Food Insecurity (05/09/2021)   Hunger Vital Sign    Worried About Running Out of Food in the Last Year: Never true     Ran Out of Food in the Last Year: Never true  Transportation Needs: Unmet Transportation Needs (05/09/2021)   PRAPARE - Administrator, Civil Service (Medical): Yes    Lack of Transportation (Non-Medical): Yes  Physical Activity: Not on File (05/25/2019)   Received from [facility]   Physical Activity    Physical Activity: 0  Recent Concern: Physical Activity - At Risk (05/25/2019)   Received from [facility]   Physical Activity    Physical Activity: 2  Stress: Not on File (05/25/2019)   Received from [facility]   Stress    Stress: 0  Social Connections: Not at Risk (05/25/2019)   Received from [place], [place]   Social Connections    Social Connections and Isolation: 1  Intimate Partner Violence: Not on file   Family History  Problem Relation Age of Onset   Diabetes Mellitus II Brother    Coronary artery disease Brother 57       MI   Coronary artery disease Brother 71       MI   Coronary artery disease Brother 72       MI   BP 104/70   Pulse 69   Wt 79.2 kg (174 lb 9.6 oz)   SpO2 99%   BMI 25.78 kg/m   Wt Readings from Last 3 Encounters:  01/02/23 79.2 kg (174 lb 9.6 oz)  09/17/22 76.8 kg (169 lb 6.4 oz)  07/16/22 76.7 kg (169 lb)   PHYSICAL EXAM: General:  Well appearing. No resp difficulty HEENT: normal Neck: supple. no JVD. Carotids 2+ bilat; no bruits. No lymphadenopathy or thryomegaly appreciated. Cor: PMI nondisplaced. Regular rate & rhythm. No rubs, gallops or murmurs. Lungs: clear Abdomen: soft, nontender, nondistended. No hepatosplenomegaly. No bruits or masses. Good bowel sounds. Extremities: no cyanosis, clubbing, rash, edema Neuro: alert & orientedx3, cranial nerves grossly intact. moves all 4 extremities w/o difficulty. Affect pleasant   ASSESSMENT & PLAN:  1. Chronic Systolic Heart Failure - Echo (12/22): EF 15-20%, RV mildly reduced (no prior study for comparison, though records report prior EF ~25-30% at OSH).  - LHC initially deferred given h/o poor  compliance and absence of CP  - Echo (5/23): EF improved, 35-40%, RV mod reduced  - Echo today 01/02/23 EF 25-30% prominent trabecuale - NYHA I-II, volume looks good today. Uses Lasix 40 PRN. - Continue Farxiga 10 mg daily. - Continue Entresto 24/26 mg bid. - Continue spiro 25 mg daily.  - Continue Coreg 12.5 mg daily  - He has persistent LV dysfunction. Etiology unclear but in looking at echo today I worry about LVNC. Other possibilities include CAD, ETOH/substance abuse, HTN, etc - Will plan cMRI and R/L  cath - Stressed need to stop ETOH - Not ICD candidate currently with substance issues   2. Hypertension  - Blood pressure well controlled. Continue current regimen.  3. HL - Restart atorva 80. - Followed by PCP   4. Polysubstance Abuse - + history of cocaine, tobacco and ETOH use - Abstinence imperative given CV disease.  - He has cut back on ETOH and tobacco, encouraged complete cessation.  5. DM2 - Continue metformin - Continue Farxiga as above.  6. SDOH - Engage HFSW to help him again today  Total time spent 45 minutes. Over half that time spent discussing above.    Arvilla Meres, MD  2:41 PM

## 2023-01-01 NOTE — Progress Notes (Signed)
ADVANCED HF CLINIC NOTE  PCP: [facility] Cardiologist: Dr. Gala Romney  HPI: Joseph Reese is a 57 y.o. male w/ h/o chronic systolic heart failure, first diagnosed in [place], h/o poor compliance, HTN, cocaine, ETOH and tobacco abuse. Previously uninsured.    Admitted to [facility] 12/22 w/ acute on chronic HF. BP was markedly elevated on admit c/w hypertensive emergency. USD + for cocaine. HS trop 58>>32>>54>>65. Echo EF 15-20%, RV mildly reduced. Per chart, records from OSH showed EF previously 25-30%. He was diuresed and placed on GDMT. Given concern for noncompliance, RHC/LHC deferred to the out-patient setting to ensure he is following up regularly and is compliant with medications, as would need to be compliant with DAPT if PCI needed. Referred to [facility] clinic. D/c Wt 213 lb.   Seen in [facility] 05/19/21, no further drug use and cut down on ETOH. Strong CAD family history, 3 brothers died from MIs, ages 46, 13 and 57 y/o. Entresto started. Referred to the [facility].   Echo 5/23 showing interval improvement in LVEF, up to 35-40%. RV moderately reduced, RVSP nl at 20 mmHg.   Follow up 1/24,NYHA I and volume ok. Out of most GDMT. Meds restarted with plans to repeat echo.  Today he returns for HF follow up. Overall feeling fine. He does not have SOB walking up steps. Denies palpitations, CP, dizziness, edema, or PND/Orthopnea. Appetite ok. No fever or chills. Weight at home 172 pounds. Out of most meds, ran out. Drinks 1 beer/day, smokes 1-2 cigs/day, used cocaine a couple weeks ago. Works as a Designer, fashion/clothing, lives with GF, cell phone broken. He uses bus for transportation.  Cardiac Testing  - Echo (5/23): EF 35-40%, RV mod reduced, normal RVSP 20 mmHg   - Echo (12/22): EF 15-20%, severe LV dysfunction, moderate LVH, RV mildly down.   Past Medical History:  Diagnosis Date   CHF (congestive heart failure), NYHA class II (HCC)    Cocaine abuse (HCC)    Diabetes mellitus without complication (HCC)    ETOH  abuse    Hypertension    Tobacco abuse    Current Outpatient Medications  Medication Sig Dispense Refill   atorvastatin (LIPITOR) 80 MG tablet Take 1 tablet (80 mg total) by mouth daily. 90 tablet 3   carvedilol (COREG) 12.5 MG tablet Take 1 tablet (12.5 mg total) by mouth 2 (two) times daily with a meal. 60 tablet 3   dapagliflozin propanediol (FARXIGA) 10 MG TABS tablet Take 1 tablet (10 mg total) by mouth daily before breakfast. (Patient not taking: Reported on 09/17/2022) 60 tablet 3   dapagliflozin propanediol (FARXIGA) 10 MG TABS tablet Take 1 tablet (10 mg total) by mouth daily before breakfast. 30 tablet 11   furosemide (LASIX) 40 MG tablet Take 1 tablet (40 mg total) by mouth as needed for fluid or edema. 30 tablet 4   sacubitril-valsartan (ENTRESTO) 24-26 MG Take 1 tablet by mouth 2 (two) times daily. 180 tablet 3   spironolactone (ALDACTONE) 25 MG tablet Take 1 tablet (25 mg total) by mouth daily. 30 tablet 3   No current facility-administered medications for this encounter.   No Known Allergies  Social History   Socioeconomic History   Marital status: Single    Spouse name: Not on file   Number of children: 4   Years of education: Not on file   Highest education level: High school graduate  Occupational History   Occupation: unemployed    Comment: contract roofer--unable to work since  12/2020, d/t physical  Tobacco Use   Smoking status: Every Day    Current packs/day: 0.50    Average packs/day: 0.5 packs/day for 40.0 years (20.0 ttl pk-yrs)    Types: Cigarettes   Smokeless tobacco: Never  Vaping Use   Vaping status: Never Used  Substance and Sexual Activity   Alcohol use: Yes    Alcohol/week: 18.0 standard drinks of alcohol    Types: 12 Cans of beer, 6 Shots of liquor per week    Comment: 40 years of consistent   Drug use: Yes    Frequency: 1.0 times per week    Types: Cocaine    Comment: last use 12/21, 3-4x/mo   Sexual activity: Not on file  Other Topics  Concern   Not on file  Social History Narrative   Not on file   Social Determinants of Health   Financial Resource Strain: High Risk (06/04/2022)   Overall Financial Resource Strain (CARDIA)    Difficulty of Paying Living Expenses: Hard  Food Insecurity: No Food Insecurity (05/09/2021)   Hunger Vital Sign    Worried About Running Out of Food in the Last Year: Never true    Ran Out of Food in the Last Year: Never true  Transportation Needs: Unmet Transportation Needs (05/09/2021)   PRAPARE - Administrator, Civil Service (Medical): Yes    Lack of Transportation (Non-Medical): Yes  Physical Activity: Not on File (05/25/2019)   Received from [facility]   Physical Activity    Physical Activity: 0  Recent Concern: Physical Activity - At Risk (05/25/2019)   Received from [facility]   Physical Activity    Physical Activity: 2  Stress: Not on File (05/25/2019)   Received from [facility]   Stress    Stress: 0  Social Connections: Not at Risk (05/25/2019)   Received from [place], [place]   Social Connections    Social Connections and Isolation: 1  Intimate Partner Violence: Not on file   Family History  Problem Relation Age of Onset   Diabetes Mellitus II Brother    Coronary artery disease Brother 72       MI   Coronary artery disease Brother 64       MI   Coronary artery disease Brother 11       MI   There were no vitals taken for this visit.  Wt Readings from Last 3 Encounters:  09/17/22 76.8 kg (169 lb 6.4 oz)  07/16/22 76.7 kg (169 lb)  06/28/22 78.1 kg (172 lb 3.2 oz)   PHYSICAL EXAM: General:  NAD. No resp difficulty, walked into clinic HEENT: Normal Neck: Supple. No JVD. Carotids 2+ bilat; no bruits. No lymphadenopathy or thryomegaly appreciated. Cor: PMI nondisplaced. Regular rate & rhythm. No rubs, gallops or murmurs. Lungs: Clear Abdomen: Soft, nontender, nondistended. No hepatosplenomegaly. No bruits or masses. Good bowel sounds. Extremities: No cyanosis, clubbing,  rash, edema Neuro: Alert & oriented x 3, cranial nerves grossly intact. Moves all 4 extremities w/o difficulty. Affect pleasant.  ASSESSMENT & PLAN:  1. Chronic Systolic Heart Failure - Echo (12/22): EF 15-20%, RV mildly reduced (no prior study for comparison, though records report prior EF ~25-30% at OSH).  - LHC initially deferred given h/o poor compliance and absence of CP  - Echo (5/23): EF improved, 35-40%, RV mod reduced  - Etiology uncertain but suspect NICM given improving EF w/ medical therapy and reduction of ETOH consumption. Suspect likely HTN CM + Drug induced (ETOH CM + prior  cocaine use). Has multiple risk factors for CAD but no h/o CP.   - NYHA I, volume looks good today. Uses Lasix 40 PRN. - Restart Farxiga 10 mg daily. - Restart Entresto 24/26 mg bid. - Continue spiro 25 mg daily.  - Continue Coreg 12.5 mg daily  - Labs today - Repeat echo soon. Long discussion about BP control and absence from cocaine/ETOH.    2. Hypertension  - BP elevated. - Restart meds as above.  3. HL - Restart atorva 80. - Followed by PCP   4. Polysubstance Abuse - + history of cocaine, tobacco and ETOH use - Used cocaine 2 weeks ago. Discussed plan to stop. - Abstinence imperative given CV disease.  - He has cut back on ETOH and tobacco, encouraged complete cessation.  5. DM2 - Continue metformin - Restart Farxiga as above.  6. SDOH - Engage HFSW to help with obtaining phone. - GF # updated in chart so we can get in touch with him - He is insured.   Arvilla Meres, MD  11:26 PM

## 2023-01-02 ENCOUNTER — Ambulatory Visit (HOSPITAL_BASED_OUTPATIENT_CLINIC_OR_DEPARTMENT_OTHER)
Admission: RE | Admit: 2023-01-02 | Discharge: 2023-01-02 | Disposition: A | Discharge status: Home / Self Care | Payer: 59 | Source: Ambulatory Visit | Attending: Internal Medicine | Admitting: Internal Medicine

## 2023-01-02 ENCOUNTER — Other Ambulatory Visit (HOSPITAL_COMMUNITY): Payer: Self-pay

## 2023-01-02 ENCOUNTER — Ambulatory Visit (HOSPITAL_COMMUNITY)
Admission: RE | Admit: 2023-01-02 | Discharge: 2023-01-02 | Disposition: A | Discharge status: Home / Self Care | Payer: 59 | Source: Ambulatory Visit | Attending: Internal Medicine | Admitting: Internal Medicine

## 2023-01-02 ENCOUNTER — Encounter (HOSPITAL_COMMUNITY): Payer: Self-pay | Admitting: Internal Medicine

## 2023-01-02 VITALS — BP 104/70 | HR 69 | Wt 174.6 lb

## 2023-01-02 DIAGNOSIS — J9601 Acute respiratory failure with hypoxia: Secondary | ICD-10-CM | POA: Diagnosis not present

## 2023-01-02 DIAGNOSIS — J810 Acute pulmonary edema: Secondary | ICD-10-CM | POA: Diagnosis not present

## 2023-01-02 DIAGNOSIS — F1910 Other psychoactive substance abuse, uncomplicated: Secondary | ICD-10-CM | POA: Diagnosis not present

## 2023-01-02 DIAGNOSIS — I5022 Chronic systolic (congestive) heart failure: Secondary | ICD-10-CM | POA: Insufficient documentation

## 2023-01-02 DIAGNOSIS — Z7984 Long term (current) use of oral hypoglycemic drugs: Secondary | ICD-10-CM | POA: Insufficient documentation

## 2023-01-02 DIAGNOSIS — I10 Essential (primary) hypertension: Secondary | ICD-10-CM

## 2023-01-02 DIAGNOSIS — Z5982 Transportation insecurity: Secondary | ICD-10-CM | POA: Diagnosis not present

## 2023-01-02 DIAGNOSIS — I517 Cardiomegaly: Secondary | ICD-10-CM | POA: Diagnosis not present

## 2023-01-02 DIAGNOSIS — E785 Hyperlipidemia, unspecified: Secondary | ICD-10-CM | POA: Diagnosis not present

## 2023-01-02 DIAGNOSIS — I110 Hypertensive heart disease with heart failure: Secondary | ICD-10-CM | POA: Insufficient documentation

## 2023-01-02 DIAGNOSIS — I080 Rheumatic disorders of both mitral and aortic valves: Secondary | ICD-10-CM

## 2023-01-02 DIAGNOSIS — Z79899 Other long term (current) drug therapy: Secondary | ICD-10-CM | POA: Insufficient documentation

## 2023-01-02 DIAGNOSIS — E119 Type 2 diabetes mellitus without complications: Secondary | ICD-10-CM

## 2023-01-02 LAB — ECHOCARDIOGRAM COMPLETE
AR max vel: 1.65 cm2
AV Area VTI: 1.5 cm2
AV Area mean vel: 1.61 cm2
AV Mean grad: 3.5 mmHg
AV Peak grad: 6.3 mmHg
Ao pk vel: 1.26 m/s
Area-P 1/2: 15.8 cm2
Calc EF: 30 %
MV M vel: 1.49 m/s
MV Peak grad: 8.8 mmHg
P 1/2 time: 1298 msec
S' Lateral: 5.7 cm
Single Plane A2C EF: 30.6 %
Single Plane A4C EF: 31.2 %

## 2023-01-02 LAB — BASIC METABOLIC PANEL
Anion gap: 14 (ref 5–15)
BUN: 11 mg/dL (ref 6–20)
CO2: 28 mmol/L (ref 22–32)
Calcium: 10 mg/dL (ref 8.9–10.3)
Chloride: 98 mmol/L (ref 98–111)
Creatinine, Ser: 0.87 mg/dL (ref 0.61–1.24)
GFR, Estimated: >60 mL/min (ref >60)
Glucose, Bld: 91 mg/dL (ref 70–99)
Potassium: 4.1 mmol/L (ref 3.5–5.1)
Sodium: 140 mmol/L (ref 135–145)

## 2023-01-02 LAB — CBC
HCT: 45.8 % (ref 39.0–52.0)
Hemoglobin: 15.8 g/dL (ref 13.0–17.0)
MCH: 28.5 pg (ref 26.0–34.0)
MCHC: 34.5 g/dL (ref 30.0–36.0)
MCV: 82.7 fL (ref 80.0–100.0)
Platelets: 243 10*3/uL (ref 150–400)
RBC: 5.54 MIL/uL (ref 4.22–5.81)
RDW: 13.5 % (ref 11.5–15.5)
WBC: 6 10*3/uL (ref 4.0–10.5)
nRBC: 0 % (ref 0.0–0.2)

## 2023-01-02 LAB — BRAIN NATRIURETIC PEPTIDE: B Natriuretic Peptide: 62.5 pg/mL (ref 0.0–100.0)

## 2023-01-02 MED ORDER — FUROSEMIDE 40 MG PO TABS
40.0000 mg | ORAL_TABLET | ORAL | 4 refills | Status: DC | PRN
  Filled 2023-01-02 – 2023-01-18 (×3): qty 30, 30d supply, fill #0
  Filled 2023-06-18: qty 30, 30d supply, fill #1

## 2023-01-02 MED ORDER — ENTRESTO 24-26 MG PO TABS
1.0000 | ORAL_TABLET | Freq: Two times a day (BID) | ORAL | 3 refills | Status: DC
  Filled 2023-01-02 – 2023-01-18 (×3): qty 60, 30d supply, fill #0
  Filled 2023-06-18: qty 60, 30d supply, fill #1

## 2023-01-02 MED ORDER — CARVEDILOL 12.5 MG PO TABS
12.5000 mg | ORAL_TABLET | Freq: Two times a day (BID) | ORAL | 3 refills | Status: DC
  Filled 2023-01-02 – 2023-01-18 (×3): qty 60, 30d supply, fill #0
  Filled 2023-06-18: qty 60, 30d supply, fill #1

## 2023-01-02 MED ORDER — SPIRONOLACTONE 25 MG PO TABS
25.0000 mg | ORAL_TABLET | Freq: Every day | ORAL | 3 refills | Status: DC
  Filled 2023-01-02 – 2023-01-18 (×3): qty 30, 30d supply, fill #0
  Filled 2023-06-18: qty 30, 30d supply, fill #1

## 2023-01-02 MED ORDER — ATORVASTATIN CALCIUM 80 MG PO TABS
80.0000 mg | ORAL_TABLET | Freq: Every day | ORAL | 3 refills | Status: DC
  Filled 2023-01-02 – 2023-01-18 (×3): qty 30, 30d supply, fill #0
  Filled 2023-06-18: qty 30, 30d supply, fill #1

## 2023-01-02 MED ORDER — DAPAGLIFLOZIN PROPANEDIOL 10 MG PO TABS
10.0000 mg | ORAL_TABLET | Freq: Every day | ORAL | 11 refills | Status: DC
  Filled 2023-01-02 – 2023-01-03 (×2): qty 30, 30d supply, fill #0

## 2023-01-02 NOTE — Progress Notes (Signed)
H&V Care Navigation CSW Progress Note Pt requested to see pt about getting a cellphone.  Has not had phone for months as unable to afford.  Phone provided to pt to allow him to communicate with clinic and other providers as needed.  Pt also provided with bus passes at his request.  Pt has no job but living with girlfriend.  Gets food stamps.  No other interventions needed at this time.  Patient is participating in a Managed Medicaid Plan:  Yes  SDOH Screenings   Food Insecurity: No Food Insecurity (05/09/2021)  Housing: Low Risk  (05/09/2021)  Transportation Needs: Unmet Transportation Needs (05/09/2021)  Alcohol Screen: Low Risk  (05/09/2021)  Depression (PHQ2-9): Low Risk  (05/11/2021)  Financial Resource Strain: High Risk (01/02/2023)  Physical Activity: Not on File (05/25/2019)   Received from [facility]  Recent Concern: Physical Activity - At Risk (05/25/2019)   Received from [facility]  Social Connections: Not at Risk (05/25/2019)   Received from [place], [place]  Stress: Not on File (05/25/2019)   Received from [facility]  Tobacco Use: High Risk (01/02/2023)    Burna Sis, LCSW Clinical Social Worker [facility] Desk#: [phone] Cell#: [phone]

## 2023-01-02 NOTE — Patient Instructions (Addendum)
Medication Changes:  No Changes In Medications at this time.   Lab Work:  Labs done today, your results will be available in MyChart, we will contact you for abnormal readings.  Testing/Procedures:  Your physician has requested that you have a cardiac catheterization. Cardiac catheterization is used to diagnose and/or treat various heart conditions. Doctors may recommend this procedure for a number of different reasons. The most common reason is to evaluate chest pain. Chest pain can be a symptom of coronary artery disease (CAD), and cardiac catheterization can show whether plaque is narrowing or blocking your heart's arteries. This procedure is also used to evaluate the valves, as well as measure the blood flow and oxygen levels in different parts of your heart. For further information please visit [email or web address] Please follow instruction sheet, as given.  ?  [facility] [address] [place], [place] [address] [phone] Please take advantage of the free valet parking available at the [facility] and [employer] (Entrance C).  Proceed to the [facility] Radiology Department (First Floor) for check-in.   Magnetic resonance imaging (MRI) is a painless test that produces images of the inside of the body without using Xrays.  During an MRI, strong magnets and radio waves work together in a Data processing manager to form detailed images.   MRI images may provide more details about a medical condition than X-rays, CT scans, and ultrasounds can provide.  You may be given earphones to listen for instructions.  You may eat a light breakfast and take medications as ordered with the exception of furosemide, hydrochlorothiazide, or spironolactone(fluid pill, other). Please avoid stimulants for 12 hr prior to test. (Ie. Caffeine, nicotine, chocolate, or antihistamine medications)  An IV will be inserted into one of your veins. Contrast material will be injected into your IV. It  will leave your body through your urine within a day. You may be told to drink plenty of fluids to help flush the contrast material out of your system.  You will be asked to remove all metal, including: Watch, jewelry, and other metal objects including hearing aids, hair pieces and dentures. Also wearable glucose monitoring systems (ie. Freestyle Libre and Omnipods) (Braces and fillings normally are not a problem.)   TEST WILL TAKE APPROXIMATELY 1 HOUR  PLEASE NOTIFY SCHEDULING AT LEAST 24 HOURS IN ADVANCE IF YOU ARE UNABLE TO KEEP YOUR APPOINTMENT. [phone]  For more information and frequently asked questions, please visit our website : [email or web address]  Please call the Cardiac Imaging Nurse Navigators with any questions/concerns. [phone] Office   Special Instructions // Education:   [facility] [address] [place] [address] Dept: [phone] Loc: [phone]  Wesner Rozzelle  01/02/2023  You are scheduled for a Cardiac Catheterization on Friday, September 6 with Dr. Arvilla Meres.  1. Please arrive at the [facility] (Main Entrance A) at [facility]: [address] [place], [place] [address] at 10:00 AM (This time is 2 hour(s) before your procedure to ensure your preparation). Free valet parking service is available. You will check in at ADMITTING. The support person will be asked to wait in the waiting room.  It is OK to have someone drop you off and come back when you are ready to be discharged.    Special note: Every effort is made to have your procedure done on time. Please understand that emergencies sometimes delay scheduled procedures.  2. Diet: Do not eat solid  foods after midnight.  The patient may have clear liquids until 5am upon the day of the procedure.  3. Labs: You will need to have blood drawn on TODAY  4. Medication instructions in preparation  for your procedure:   Contrast Allergy: No  DO NOT TAKE LASIX (FUROSEMIDE) THE MORNING OF PROCEDURE  DO NOT TAKE SPIRONOLACTONE THE MORNING OF PROCEDURE   DO NOT TAKE FARXIGA THE MORNING OF PROCEDURE  On the morning of your procedure, take your Aspirin 81 mg and any morning medicines NOT listed above.  You may use sips of water.  5. Plan to go home the same day, you will only stay overnight if medically necessary. 6. Bring a current list of your medications and current insurance cards. 7. You MUST have a responsible person to drive you home. 8. Someone MUST be with you the first 24 hours after you arrive home or your discharge will be delayed. 9. Please wear clothes that are easy to get on and off and wear slip-on shoes.  Thank you for allowing [place] to care for you!   -- [facility]   Follow-Up in: 3 MONTHS WITH APP AS SCHEDULED 04/04/23   At the [facility], you and your health needs are our priority. We have a designated team specialized in the treatment of Heart Failure. This Care Team includes your primary Heart Failure Specialized Cardiologist (physician), Advanced Practice Providers (APPs- Physician Assistants and Nurse Practitioners), and Pharmacist who all work together to provide you with the care you need, when you need it.   You may see any of the following providers on your designated Care Team at your next follow up:  Dr. Arvilla Meres Dr. Marca Ancona Dr. Marcos Eke, NP Robbie Lis, [place] [facility] [place], [place] Brynda Peon, NP Karle Plumber, PharmD   Please be sure to bring in all your medications bottles to every appointment.   Need to Contact us:  If you have any questions or concerns before your next appointment please send [place] a message through [place] or call our office at [phone].    TO LEAVE A MESSAGE FOR THE NURSE SELECT OPTION 2, PLEASE LEAVE A MESSAGE  INCLUDING: YOUR NAME DATE OF BIRTH CALL BACK NUMBER REASON FOR CALL**this is important as we prioritize the call backs  YOU WILL RECEIVE A CALL BACK THE SAME DAY AS LONG AS YOU CALL BEFORE 4:00 PM

## 2023-01-02 NOTE — Progress Notes (Signed)
Orders entered for right and left heart catheterization.

## 2023-01-03 ENCOUNTER — Telehealth (HOSPITAL_COMMUNITY): Payer: Self-pay

## 2023-01-03 ENCOUNTER — Other Ambulatory Visit: Payer: Self-pay

## 2023-01-03 ENCOUNTER — Other Ambulatory Visit (HOSPITAL_COMMUNITY): Payer: Self-pay | Admitting: Pharmacist

## 2023-01-03 ENCOUNTER — Other Ambulatory Visit (HOSPITAL_COMMUNITY): Payer: Self-pay

## 2023-01-03 MED ORDER — EMPAGLIFLOZIN 10 MG PO TABS
10.0000 mg | ORAL_TABLET | Freq: Every day | ORAL | 11 refills | Status: DC
  Filled 2023-01-03 – 2023-02-04 (×2): qty 30, 30d supply, fill #0

## 2023-01-03 NOTE — Telephone Encounter (Addendum)
Advanced Heart Failure Patient Advocate Encounter  Prior authorization for [place] has been submitted and approved. Test billing returns $4 for 30 day supply.  KeyIdelle Crouch Effective: 01/03/23 to 01/02/24  Burnell Blanks, CPhT Rx Patient Advocate Phone: [phone]

## 2023-01-07 ENCOUNTER — Other Ambulatory Visit: Payer: Self-pay

## 2023-01-15 ENCOUNTER — Other Ambulatory Visit (HOSPITAL_COMMUNITY): Payer: Self-pay

## 2023-01-15 ENCOUNTER — Telehealth (HOSPITAL_COMMUNITY): Payer: Self-pay | Admitting: *Deleted

## 2023-01-17 ENCOUNTER — Other Ambulatory Visit (HOSPITAL_COMMUNITY): Payer: Self-pay

## 2023-01-17 ENCOUNTER — Telehealth (HOSPITAL_COMMUNITY): Payer: Self-pay

## 2023-01-17 NOTE — Telephone Encounter (Signed)
Spoke to patient about procedure scheduled for tomorrow.Aware of time and place. Nothing to eat or drink after midnight.Holding lasix spiro and farxiga morning of procedure. Has transportation to and from procedure

## 2023-01-18 ENCOUNTER — Other Ambulatory Visit (HOSPITAL_COMMUNITY): Payer: Self-pay

## 2023-01-18 ENCOUNTER — Encounter (HOSPITAL_COMMUNITY)
Admission: RE | Disposition: A | Discharge status: Home / Self Care | Payer: Self-pay | Source: Home / Self Care | Attending: Internal Medicine

## 2023-01-18 ENCOUNTER — Other Ambulatory Visit: Payer: Self-pay

## 2023-01-18 ENCOUNTER — Ambulatory Visit (HOSPITAL_COMMUNITY)
Admission: RE | Admit: 2023-01-18 | Discharge: 2023-01-18 | Disposition: A | Discharge status: Home / Self Care | Payer: 59 | Attending: Internal Medicine | Admitting: Internal Medicine

## 2023-01-18 DIAGNOSIS — I2510 Atherosclerotic heart disease of native coronary artery without angina pectoris: Secondary | ICD-10-CM | POA: Insufficient documentation

## 2023-01-18 DIAGNOSIS — F1910 Other psychoactive substance abuse, uncomplicated: Secondary | ICD-10-CM | POA: Insufficient documentation

## 2023-01-18 DIAGNOSIS — Z7984 Long term (current) use of oral hypoglycemic drugs: Secondary | ICD-10-CM | POA: Insufficient documentation

## 2023-01-18 DIAGNOSIS — Z5982 Transportation insecurity: Secondary | ICD-10-CM | POA: Diagnosis not present

## 2023-01-18 DIAGNOSIS — E785 Hyperlipidemia, unspecified: Secondary | ICD-10-CM | POA: Diagnosis not present

## 2023-01-18 DIAGNOSIS — I110 Hypertensive heart disease with heart failure: Secondary | ICD-10-CM | POA: Insufficient documentation

## 2023-01-18 DIAGNOSIS — F17210 Nicotine dependence, cigarettes, uncomplicated: Secondary | ICD-10-CM | POA: Diagnosis not present

## 2023-01-18 DIAGNOSIS — I5022 Chronic systolic (congestive) heart failure: Secondary | ICD-10-CM | POA: Insufficient documentation

## 2023-01-18 DIAGNOSIS — E119 Type 2 diabetes mellitus without complications: Secondary | ICD-10-CM | POA: Insufficient documentation

## 2023-01-18 DIAGNOSIS — Z79899 Other long term (current) drug therapy: Secondary | ICD-10-CM | POA: Insufficient documentation

## 2023-01-18 HISTORY — PX: RIGHT/LEFT HEART CATH AND CORONARY ANGIOGRAPHY: CATH118266

## 2023-01-18 LAB — POCT I-STAT EG7
Acid-base deficit: 1 mmol/L (ref 0.0–2.0)
Acid-base deficit: 2 mmol/L (ref 0.0–2.0)
Acid-base deficit: 1 mmol/L (ref 0.0–2.0)
Bicarbonate: 23.9 mmol/L (ref 20.0–28.0)
Bicarbonate: 24.8 mmol/L (ref 20.0–28.0)
Bicarbonate: 24.9 mmol/L (ref 20.0–28.0)
Calcium, Ion: 1.13 mmol/L — ABNORMAL LOW (ref 1.15–1.40)
Calcium, Ion: 1.31 mmol/L (ref 1.15–1.40)
Calcium, Ion: 1.2 mmol/L (ref 1.15–1.40)
HCT: 41 % (ref 39.0–52.0)
HCT: 44 % (ref 39.0–52.0)
HCT: 42 % (ref 39.0–52.0)
Hemoglobin: 13.9 g/dL (ref 13.0–17.0)
Hemoglobin: 15 g/dL (ref 13.0–17.0)
Hemoglobin: 14.3 g/dL (ref 13.0–17.0)
O2 Saturation: 80 %
O2 Saturation: 81 %
O2 Saturation: 77 %
Potassium: 3.3 mmol/L — ABNORMAL LOW (ref 3.5–5.1)
Potassium: 3.6 mmol/L (ref 3.5–5.1)
Potassium: 3.5 mmol/L (ref 3.5–5.1)
Sodium: 141 mmol/L (ref 135–145)
Sodium: 143 mmol/L (ref 135–145)
Sodium: 141 mmol/L (ref 135–145)
TCO2: 25 mmol/L (ref 22–32)
TCO2: 26 mmol/L (ref 22–32)
TCO2: 26 mmol/L (ref 22–32)
pCO2, Ven: 42.9 mmHg — ABNORMAL LOW (ref 44–60)
pCO2, Ven: 45.2 mmHg (ref 44–60)
pCO2, Ven: 45.2 mmHg (ref 44–60)
pH, Ven: 7.347 (ref 7.25–7.43)
pH, Ven: 7.355 (ref 7.25–7.43)
pH, Ven: 7.35 (ref 7.25–7.43)
pO2, Ven: 47 mmHg — ABNORMAL HIGH (ref 32–45)
pO2, Ven: 48 mmHg — ABNORMAL HIGH (ref 32–45)
pO2, Ven: 44 mmHg (ref 32–45)

## 2023-01-18 LAB — CARDIAC CATHETERIZATION: Cath EF Quantitative: 25 %

## 2023-01-18 LAB — POCT I-STAT 7, (LYTES, BLD GAS, ICA,H+H)
Acid-base deficit: 4 mmol/L — ABNORMAL HIGH (ref 0.0–2.0)
Bicarbonate: 20.9 mmol/L (ref 20.0–28.0)
Calcium, Ion: 0.96 mmol/L — ABNORMAL LOW (ref 1.15–1.40)
HCT: 38 % — ABNORMAL LOW (ref 39.0–52.0)
Hemoglobin: 12.9 g/dL — ABNORMAL LOW (ref 13.0–17.0)
O2 Saturation: 99 %
Potassium: 2.9 mmol/L — ABNORMAL LOW (ref 3.5–5.1)
Sodium: 147 mmol/L — ABNORMAL HIGH (ref 135–145)
TCO2: 22 mmol/L (ref 22–32)
pCO2 arterial: 37.3 mmHg (ref 32–48)
pH, Arterial: 7.357 (ref 7.35–7.45)
pO2, Arterial: 138 mmHg — ABNORMAL HIGH (ref 83–108)

## 2023-01-18 LAB — GLUCOSE, CAPILLARY: Glucose-Capillary: 94 mg/dL (ref 70–99)

## 2023-01-18 SURGERY — RIGHT/LEFT HEART CATH AND CORONARY ANGIOGRAPHY
Anesthesia: LOCAL

## 2023-01-18 MED ORDER — VERAPAMIL HCL 2.5 MG/ML IV SOLN
INTRAVENOUS | Status: AC
  Filled 2023-01-18: qty 2

## 2023-01-18 MED ORDER — HEPARIN SODIUM (PORCINE) 1000 UNIT/ML IJ SOLN
INTRAMUSCULAR | Status: DC | PRN
  Administered 2023-01-18: 4000 [IU] via INTRAVENOUS

## 2023-01-18 MED ORDER — SODIUM CHLORIDE 0.9 % IV SOLN: INTRAVENOUS | Status: DC

## 2023-01-18 MED ORDER — SODIUM CHLORIDE 0.9% FLUSH: 3.0000 mL | Freq: Two times a day (BID) | INTRAVENOUS | Status: DC

## 2023-01-18 MED ORDER — LIDOCAINE HCL (PF) 1 % IJ SOLN
INTRAMUSCULAR | Status: DC | PRN
  Administered 2023-01-18: 5 mL via INTRADERMAL
  Administered 2023-01-18: 2 mL via INTRADERMAL

## 2023-01-18 MED ORDER — HEPARIN SODIUM (PORCINE) 1000 UNIT/ML IJ SOLN
INTRAMUSCULAR | Status: AC
  Filled 2023-01-18: qty 10

## 2023-01-18 MED ORDER — LABETALOL HCL 5 MG/ML IV SOLN: 10.0000 mg | INTRAVENOUS | Status: DC | PRN

## 2023-01-18 MED ORDER — MIDAZOLAM HCL 2 MG/2ML IJ SOLN
INTRAMUSCULAR | Status: DC | PRN
  Administered 2023-01-18 (×3): 1 mg via INTRAVENOUS

## 2023-01-18 MED ORDER — HEPARIN (PORCINE) IN NACL 1000-0.9 UT/500ML-% IV SOLN
INTRAVENOUS | Status: DC | PRN
  Administered 2023-01-18 (×2): 500 mL

## 2023-01-18 MED ORDER — ONDANSETRON HCL 4 MG/2ML IJ SOLN: 4.0000 mg | Freq: Four times a day (QID) | INTRAMUSCULAR | Status: DC | PRN

## 2023-01-18 MED ORDER — SODIUM CHLORIDE 0.9 % IV SOLN: 250.0000 mL | INTRAVENOUS | Status: DC | PRN

## 2023-01-18 MED ORDER — HYDRALAZINE HCL 20 MG/ML IJ SOLN: 10.0000 mg | INTRAMUSCULAR | Status: DC | PRN

## 2023-01-18 MED ORDER — VERAPAMIL HCL 2.5 MG/ML IV SOLN
INTRAVENOUS | Status: DC | PRN
  Administered 2023-01-18: 10 mL via INTRA_ARTERIAL

## 2023-01-18 MED ORDER — SODIUM CHLORIDE 0.9% FLUSH: 3.0000 mL | INTRAVENOUS | Status: DC | PRN

## 2023-01-18 MED ORDER — LIDOCAINE HCL (PF) 1 % IJ SOLN
INTRAMUSCULAR | Status: AC
  Filled 2023-01-18: qty 30

## 2023-01-18 MED ORDER — FENTANYL CITRATE (PF) 100 MCG/2ML IJ SOLN
INTRAMUSCULAR | Status: AC
  Filled 2023-01-18: qty 2

## 2023-01-18 MED ORDER — IOHEXOL 350 MG/ML SOLN
INTRAVENOUS | Status: DC | PRN
  Administered 2023-01-18: 60 mL

## 2023-01-18 MED ORDER — FENTANYL CITRATE (PF) 100 MCG/2ML IJ SOLN
INTRAMUSCULAR | Status: DC | PRN
  Administered 2023-01-18 (×2): 25 ug via INTRAVENOUS

## 2023-01-18 MED ORDER — MIDAZOLAM HCL 2 MG/2ML IJ SOLN
INTRAMUSCULAR | Status: AC
  Filled 2023-01-18: qty 2

## 2023-01-18 MED ORDER — ACETAMINOPHEN 325 MG PO TABS: 650.0000 mg | ORAL_TABLET | ORAL | Status: DC | PRN

## 2023-01-18 MED ORDER — ASPIRIN 81 MG PO CHEW
81.0000 mg | CHEWABLE_TABLET | ORAL | Status: AC
  Administered 2023-01-18: 81 mg via ORAL
  Filled 2023-01-18: qty 1

## 2023-01-18 SURGICAL SUPPLY — 13 items
CATH 5FR JL3.5 JR4 ANG PIG MP (CATHETERS) IMPLANT
CATH BALLN WEDGE 5F 110CM (CATHETERS) IMPLANT
CATH INFINITI 5 FR JL3.5 (CATHETERS) IMPLANT
CATH INFINITI AMBI 5FR TG (CATHETERS) IMPLANT
DEVICE RAD COMP TR BAND LRG (VASCULAR PRODUCTS) IMPLANT
GLIDESHEATH SLEND SS 6F .021 (SHEATH) IMPLANT
GUIDEWIRE .025 260CM (WIRE) IMPLANT
GUIDEWIRE INQWIRE 1.5J.035X260 (WIRE) IMPLANT
INQWIRE 1.5J .035X260CM (WIRE) ×1
KIT SYRINGE INJ CVI SPIKEX1 (MISCELLANEOUS) IMPLANT
PACK CARDIAC CATHETERIZATION (CUSTOM PROCEDURE TRAY) ×2 IMPLANT
SET ATX-X65L (MISCELLANEOUS) IMPLANT
SHEATH GLIDE SLENDER 4/5FR (SHEATH) IMPLANT

## 2023-01-18 NOTE — Interval H&P Note (Signed)
History and Physical Interval Note:  01/18/2023 12:30 PM  Joseph Reese  has presented today for surgery, with the diagnosis of heart failure.  The various methods of treatment have been discussed with the patient and family. After consideration of risks, benefits and other options for treatment, the patient has consented to  Procedure(s): RIGHT/LEFT HEART CATH AND CORONARY ANGIOGRAPHY (N/A) as a surgical intervention.  The patient's history has been reviewed, patient examined, no change in status, stable for surgery.  I have reviewed the patient's chart and labs.  Questions were answered to the patient's satisfaction.     Jacqualin Shirkey

## 2023-01-18 NOTE — Progress Notes (Signed)
TR BAND REMOVAL  LOCATION:    right radial  DEFLATED PER PROTOCOL:    Yes.    TIME BAND OFF / DRESSING APPLIED:    1655 gauze dressing applied   SITE UPON ARRIVAL:    Level 0  SITE AFTER BAND REMOVAL:    Level 0  CIRCULATION SENSATION AND MOVEMENT:    Within Normal Limits   Yes.    COMMENTS:   no issues noted

## 2023-01-18 NOTE — Discharge Instructions (Signed)

## 2023-01-21 ENCOUNTER — Encounter (HOSPITAL_COMMUNITY): Payer: Self-pay | Admitting: Internal Medicine

## 2023-01-22 IMAGING — DX DG CHEST 1V PORT
1 series · 1 of 1 positions shown · non-contrast
Comparison: April 11, 2021

CLINICAL DATA: Shortness of breath.

EXAM:
PORTABLE CHEST 1 VIEW

[chest ap]
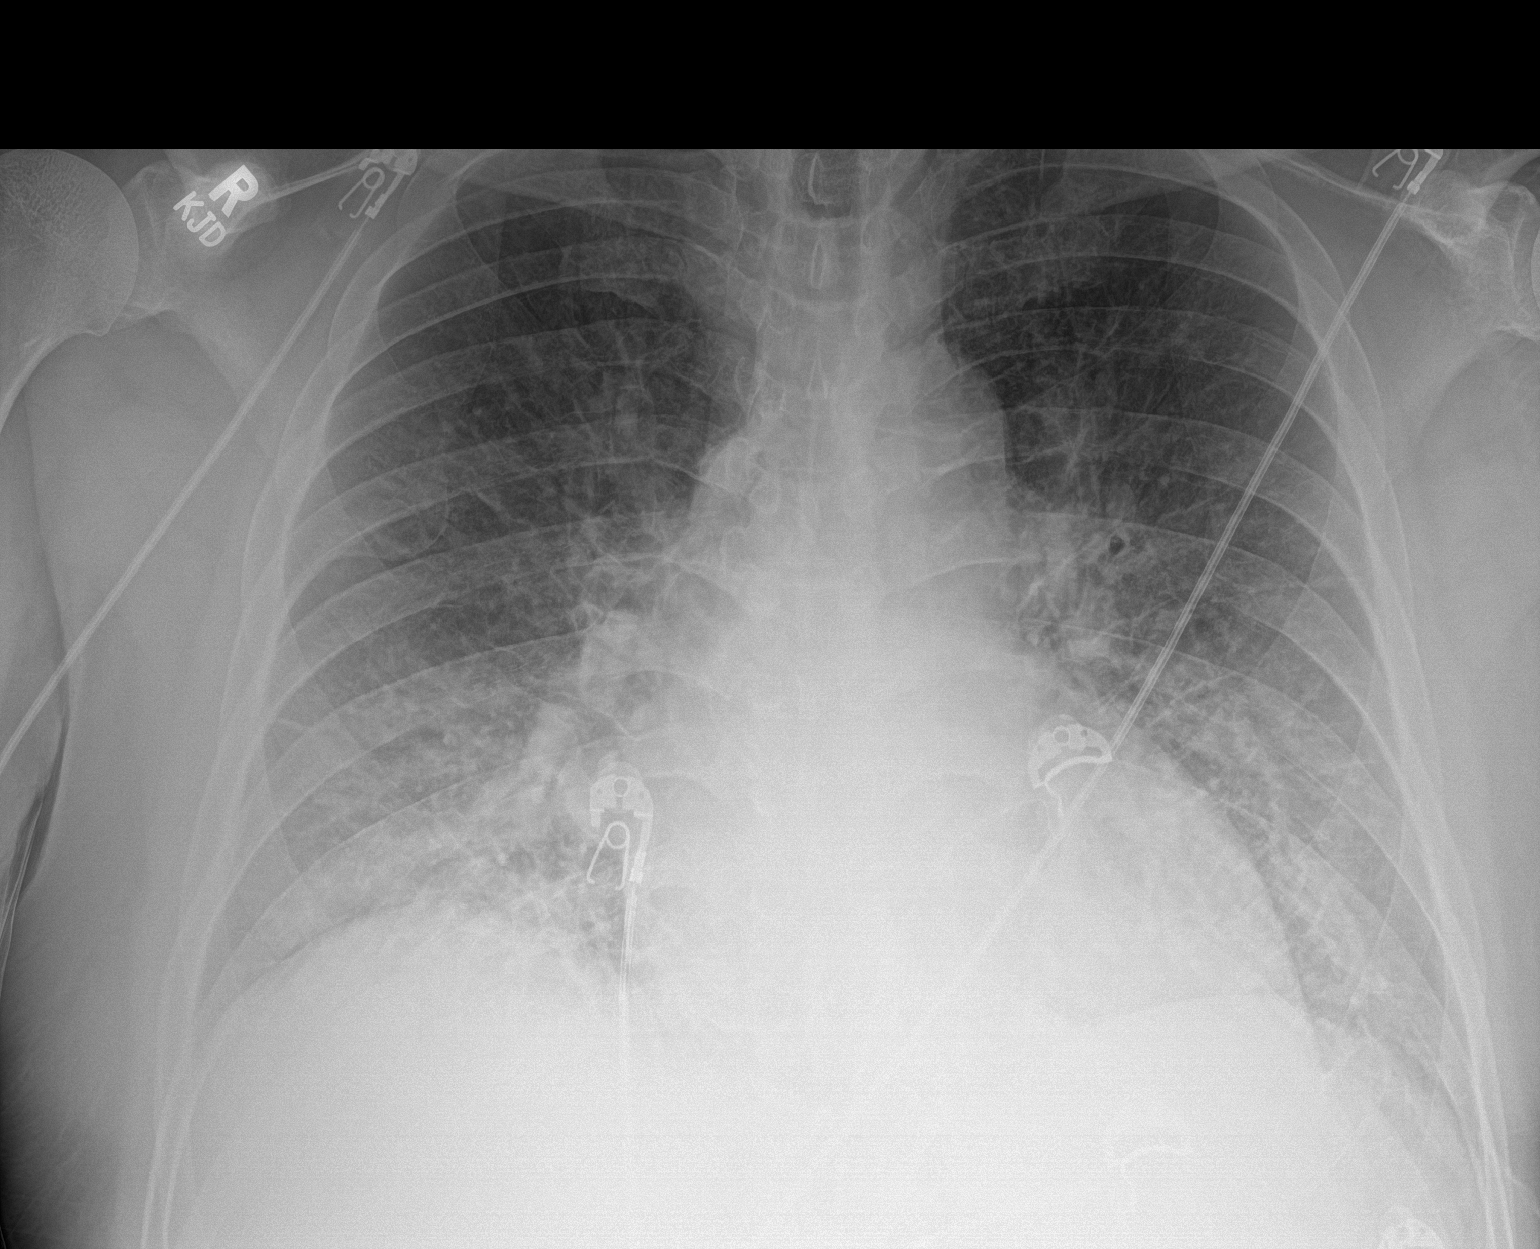

[1 of 1 positions shown; findings below may reference images not displayed]

FINDINGS: Mild, diffusely increased interstitial lung markings are noted. Mild
to moderate severity areas of atelectasis and/or infiltrate are seen
within the bilateral lung bases. There is no evidence of a pleural
effusion or pneumothorax. The cardiac silhouette is borderline in
size and unchanged in appearance. The visualized skeletal structures
are unremarkable.
IMPRESSION: Mild interstitial edema with mild to moderate severity bibasilar
atelectasis and/or infiltrate.

## 2023-01-29 ENCOUNTER — Other Ambulatory Visit (HOSPITAL_COMMUNITY): Payer: Self-pay

## 2023-01-31 ENCOUNTER — Other Ambulatory Visit (HOSPITAL_COMMUNITY): Payer: Self-pay

## 2023-02-04 ENCOUNTER — Other Ambulatory Visit (HOSPITAL_COMMUNITY): Payer: Self-pay

## 2023-02-04 ENCOUNTER — Telehealth (HOSPITAL_COMMUNITY): Payer: Self-pay | Admitting: Pharmacy Technician

## 2023-02-04 NOTE — Telephone Encounter (Signed)
Advanced [facility] Failure Patient Advocate Encounter  Attempted to submit Jardiance PA through Medicaid, awaiting questions.   Will follow up.

## 2023-02-04 NOTE — Telephone Encounter (Signed)
Patient Advocate Encounter   Received notification from [facility] that prior authorization for Jardiance is required.   PA submitted on CoverMyMeds Key [id number] Status is pending   Will continue to follow.

## 2023-02-05 ENCOUNTER — Other Ambulatory Visit (HOSPITAL_COMMUNITY): Payer: Self-pay

## 2023-02-05 NOTE — Telephone Encounter (Signed)
Prior authorization for [place] Pepper has been submitted and approved. Test billing returns $4 for 30 day supply.  Key: [id number] Effective: 02/04/2023 to 02/04/2024  Burnell Blanks, CPhT Rx Patient Advocate Phone: [phone]

## 2023-02-18 ENCOUNTER — Other Ambulatory Visit (HOSPITAL_COMMUNITY): Payer: Self-pay

## 2023-02-18 ENCOUNTER — Telehealth (HOSPITAL_COMMUNITY): Payer: Self-pay | Admitting: Licensed Clinical Social Worker

## 2023-02-18 NOTE — Telephone Encounter (Signed)
H&V Care Navigation CSW Progress Note  Clinical Social Worker received call from pt requesting help with getting more minutes on his phone- able to assist in one month of minutes.   SDOH Screenings   Food Insecurity: No Food Insecurity (05/09/2021)  Housing: Low Risk  (05/09/2021)  Transportation Needs: Unmet Transportation Needs (05/09/2021)  Alcohol Screen: Low Risk  (05/09/2021)  Depression (PHQ2-9): Low Risk  (05/11/2021)  Financial Resource Strain: High Risk (01/02/2023)  Physical Activity: Not on File (05/25/2019)   Received from [facility]  Recent Concern: Physical Activity - At Risk (05/25/2019)   Received from [facility]  Social Connections: Not on File (05/25/2019)   Received from [facility]  Stress: Not on File (05/25/2019)   Received from [facility]  Tobacco Use: High Risk (01/02/2023)    Burna Sis, LCSW Clinical Social Worker [facility] Desk#: [phone] Cell#: [phone]

## 2023-03-19 ENCOUNTER — Telehealth (HOSPITAL_COMMUNITY): Payer: Self-pay | Admitting: *Deleted

## 2023-03-25 ENCOUNTER — Telehealth (HOSPITAL_COMMUNITY): Payer: Self-pay | Admitting: *Deleted

## 2023-03-25 NOTE — Telephone Encounter (Signed)
Attempted to call patient regarding upcoming cardiac MRI appointment. Unable to leave VM. Johney Frame RN Navigator Cardiac Imaging [facility] [phone] Office

## 2023-03-26 ENCOUNTER — Ambulatory Visit (HOSPITAL_COMMUNITY): Admission: RE | Admit: 2023-03-26 | Payer: 59 | Source: Ambulatory Visit

## 2023-04-03 ENCOUNTER — Telehealth (HOSPITAL_COMMUNITY): Payer: Self-pay

## 2023-04-03 NOTE — Telephone Encounter (Signed)
Called and was unable to leave patient a voice message to confirm/remind patient of their appointment at the [facility] on 04/04/23.

## 2023-04-04 ENCOUNTER — Encounter (HOSPITAL_COMMUNITY): Payer: 59

## 2023-04-24 ENCOUNTER — Other Ambulatory Visit (HOSPITAL_COMMUNITY): Payer: 59

## 2023-06-17 ENCOUNTER — Emergency Department (HOSPITAL_COMMUNITY)
Admission: EM | Admit: 2023-06-17 | Discharge: 2023-06-18 | Disposition: A | Discharge status: Home / Self Care | Payer: 59

## 2023-06-17 ENCOUNTER — Emergency Department (HOSPITAL_COMMUNITY): Payer: 59

## 2023-06-17 ENCOUNTER — Encounter (HOSPITAL_COMMUNITY): Payer: Self-pay | Admitting: Emergency Medicine

## 2023-06-17 DIAGNOSIS — Z79899 Other long term (current) drug therapy: Secondary | ICD-10-CM | POA: Insufficient documentation

## 2023-06-17 DIAGNOSIS — R0602 Shortness of breath: Secondary | ICD-10-CM | POA: Insufficient documentation

## 2023-06-17 DIAGNOSIS — R0789 Other chest pain: Secondary | ICD-10-CM | POA: Insufficient documentation

## 2023-06-17 DIAGNOSIS — I10 Essential (primary) hypertension: Secondary | ICD-10-CM | POA: Insufficient documentation

## 2023-06-17 DIAGNOSIS — R079 Chest pain, unspecified: Secondary | ICD-10-CM

## 2023-06-17 DIAGNOSIS — Z20822 Contact with and (suspected) exposure to covid-19: Secondary | ICD-10-CM | POA: Insufficient documentation

## 2023-06-17 DIAGNOSIS — R059 Cough, unspecified: Secondary | ICD-10-CM | POA: Insufficient documentation

## 2023-06-17 LAB — BASIC METABOLIC PANEL
Anion gap: 12 (ref 5–15)
BUN: 5 mg/dL — ABNORMAL LOW (ref 6–20)
CO2: 23 mmol/L (ref 22–32)
Calcium: 8.9 mg/dL (ref 8.9–10.3)
Chloride: 102 mmol/L (ref 98–111)
Creatinine, Ser: 1.01 mg/dL (ref 0.61–1.24)
GFR, Estimated: >60 mL/min (ref >60)
Glucose, Bld: 84 mg/dL (ref 70–99)
Potassium: 3.4 mmol/L — ABNORMAL LOW (ref 3.5–5.1)
Sodium: 137 mmol/L (ref 135–145)

## 2023-06-17 LAB — CBC
HCT: 39.7 % (ref 39.0–52.0)
Hemoglobin: 13.4 g/dL (ref 13.0–17.0)
MCH: 28.9 pg (ref 26.0–34.0)
MCHC: 33.8 g/dL (ref 30.0–36.0)
MCV: 85.7 fL (ref 80.0–100.0)
Platelets: 234 10*3/uL (ref 150–400)
RBC: 4.63 MIL/uL (ref 4.22–5.81)
RDW: 14.6 % (ref 11.5–15.5)
WBC: 5 10*3/uL (ref 4.0–10.5)
nRBC: 0 % (ref 0.0–0.2)

## 2023-06-17 LAB — RESP PANEL BY RT-PCR (RSV, FLU A&B, COVID)  RVPGX2
Influenza A by PCR: NEGATIVE
Influenza B by PCR: NEGATIVE
Resp Syncytial Virus by PCR: NEGATIVE
SARS Coronavirus 2 by RT PCR: NEGATIVE

## 2023-06-17 LAB — TROPONIN I (HIGH SENSITIVITY): Troponin I (High Sensitivity): 36 ng/L — ABNORMAL HIGH (ref <18)

## 2023-06-17 LAB — BRAIN NATRIURETIC PEPTIDE: B Natriuretic Peptide: 185.1 pg/mL — ABNORMAL HIGH (ref 0.0–100.0)

## 2023-06-17 NOTE — ED Triage Notes (Signed)
Chest pain  and SOB and cough non productive for 2 days. No fever hasn't taken anything for it. Hx CHF but does not feel more swollen than usual. Has congestion.

## 2023-06-17 NOTE — ED Provider Triage Note (Signed)
Emergency Medicine Provider Triage Evaluation Note  Render Joseph Reese , a 58 y.o. male  was evaluated in triage.  Pt complains of CP, SOB, nonproductive cough, nasal congestion x 2 days. Hx CHF, takes lasix, doesn't feel more swollen than normal.   Review of Systems  Positive: As above Negative: Fever, N/V/D, wheezing  Physical Exam  There were no vitals taken for this visit. Gen:   Awake, no distress   Resp:  Normal effort  MSK:   Moves extremities without difficulty  Other:    Medical Decision Making  Medically screening exam initiated at 8:52 PM.  Appropriate orders placed.  Joseph Reese was informed that the remainder of the evaluation will be completed by another provider, this initial triage assessment does not replace that evaluation, and the importance of remaining in the ED until their evaluation is complete.     Su Monks, Cordelia Poche 06/17/23 2052

## 2023-06-18 ENCOUNTER — Other Ambulatory Visit: Payer: Self-pay

## 2023-06-18 ENCOUNTER — Other Ambulatory Visit (HOSPITAL_COMMUNITY): Payer: Self-pay

## 2023-06-18 LAB — TROPONIN I (HIGH SENSITIVITY): Troponin I (High Sensitivity): 37 ng/L — ABNORMAL HIGH (ref <18)

## 2023-06-18 MED ORDER — FUROSEMIDE 20 MG PO TABS
80.0000 mg | ORAL_TABLET | Freq: Once | ORAL | Status: AC
  Administered 2023-06-18: 80 mg via ORAL
  Filled 2023-06-18: qty 4

## 2023-06-18 NOTE — ED Provider Notes (Signed)
 [place] EMERGENCY DEPARTMENT AT [facility] Provider Note   CSN: [id number] Arrival date & time: 06/17/23  2031     History  Chief Complaint  Patient presents with   Chest Pain    Joseph Reese is a 58 y.o. male.  58 year old male presenting emergency department for intermittent chest pain.  Reported some shortness of breath triage, denied shortness of breath with me.  Notes that he has had a cough for the past day or 2.  He has been out of his medications for the past month, denies lower extremity edema, dyspnea on exertion, orthopnea.     Chest Pain      Home Medications Prior to Admission medications   Medication Sig Start Date End Date Taking? Authorizing Provider  Ascorbic Acid (VITAMIN C PO) Take 1 tablet by mouth in the morning.    [provider]  atorvastatin  (LIPITOR) 80 MG tablet Take 1 tablet (80 mg total) by mouth daily. 01/02/23   Bensimhon, Toribio SAUNDERS, MD  carvedilol  (COREG ) 12.5 MG tablet Take 1 tablet (12.5 mg total) by mouth 2 (two) times daily with a meal. 01/02/23   Bensimhon, Toribio SAUNDERS, MD  empagliflozin  (JARDIANCE ) 10 MG TABS tablet Take 1 tablet (10 mg total) by mouth daily before breakfast. 01/03/23   Bensimhon, Toribio SAUNDERS, MD  furosemide  (LASIX ) 40 MG tablet Take 1 tablet (40 mg total) by mouth as needed for fluid or edema. 01/02/23   Bensimhon, Daniel R, MD  Multiple Vitamin (MULTIVITAMIN WITH MINERALS) TABS tablet Take 1 tablet by mouth in the morning.    [provider]  sacubitril -valsartan  (ENTRESTO ) 24-26 MG Take 1 tablet by mouth 2 (two) times daily. 01/02/23   Bensimhon, Toribio SAUNDERS, MD  spironolactone  (ALDACTONE ) 25 MG tablet Take 1 tablet (25 mg total) by mouth daily. 01/02/23   Bensimhon, Toribio SAUNDERS, MD      Allergies    Patient has no known allergies.    Review of Systems   Review of Systems  Cardiovascular:  Positive for chest pain.    Physical Exam Updated Vital Signs BP 114/68   Pulse 73   Temp 97.8 F (36.6 C)  (Oral)   Resp 18   Ht 5' 9 (1.753 m)   Wt 81.6 kg   SpO2 100%   BMI 26.57 kg/m  Physical Exam Vitals and nursing note reviewed.  Constitutional:      General: He is not in acute distress.    Appearance: He is not toxic-appearing.  HENT:     Head: Normocephalic.  Cardiovascular:     Rate and Rhythm: Normal rate and regular rhythm.     Heart sounds: Normal heart sounds.  Pulmonary:     Breath sounds: Normal breath sounds.  Musculoskeletal:        General: Normal range of motion.     Right lower leg: No edema.     Left lower leg: No edema.  Skin:    General: Skin is warm.     Capillary Refill: Capillary refill takes less than 2 seconds.  Neurological:     Mental Status: He is alert and oriented to person, place, and time.  Psychiatric:        Mood and Affect: Mood normal.        Behavior: Behavior normal.     ED Results / Procedures / Treatments   Labs (all labs ordered are listed, but only abnormal results are displayed) Labs Reviewed  BASIC METABOLIC PANEL - Abnormal; Notable  for the following components:      Result Value   Potassium 3.4 (*)    BUN 5 (*)    All other components within normal limits  BRAIN NATRIURETIC PEPTIDE - Abnormal; Notable for the following components:   B Natriuretic Peptide 185.1 (*)    All other components within normal limits  TROPONIN I (HIGH SENSITIVITY) - Abnormal; Notable for the following components:   Troponin I (High Sensitivity) 36 (*)    All other components within normal limits  TROPONIN I (HIGH SENSITIVITY) - Abnormal; Notable for the following components:   Troponin I (High Sensitivity) 37 (*)    All other components within normal limits  RESP PANEL BY RT-PCR (RSV, FLU A&B, COVID)  [id number]  CBC    EKG EKG Interpretation Date/Time:  Tuesday June 18 2023 08:29:21 EST Ventricular Rate:  74 PR Interval:  188 QRS Duration:  101 QT Interval:  456 QTC Calculation: 506 R Axis:   58  Text Interpretation: Sinus rhythm  Left ventricular hypertrophy Borderline T abnormalities, inferior leads ST elevation, consider anterior injury Prolonged QT interval Confirmed by Neysa Clap [phone]) on 06/18/2023 8:57:20 AM  Radiology DG Chest 2 View Result Date: 06/17/2023 CLINICAL DATA:  Chest pain EXAM: CHEST - 2 VIEW COMPARISON:  05/06/2021 FINDINGS: The heart size and mediastinal contours are within normal limits. Both lungs are clear. The visualized skeletal structures are unremarkable. IMPRESSION: No active cardiopulmonary disease. Electronically Signed   By: Elsie Gravely M.D.   On: 06/17/2023 21:10    Procedures Procedures    Medications Ordered in ED Medications  furosemide  (LASIX ) tablet 80 mg (80 mg Oral Given 06/18/23 0914)    ED Course/ Medical Decision Making/ A&P Clinical Course as of 06/18/23 0955  Tue Jun 18, 2023  0830 Cardiology note from 8/24 from admission for CHF:  58 y.o. male w/ h/o chronic systolic heart failure, first diagnosed in [place], h/o poor compliance, HTN, cocaine, ETOH and tobacco abuse. Previously uninsured.    Admitted to [facility] 12/22 w/ acute on chronic HF. BP was markedly elevated on admit c/w hypertensive emergency. USD + for cocaine. HS trop 58>>32>>54>>65. Echo EF 15-20%, RV mildly reduced. Per chart, records from OSH showed EF previously 25-30%. He was diuresed and placed on GDMT. Given concern for noncompliance, RHC/LHC deferred to the out-patient setting to ensure he is following up regularly and is compliant with medications, as would need to be compliant with DAPT if PCI needed. Referred to [facility] clinic. D/c Wt 213 lb.    Seen in [facility] 05/19/21, no further drug use and cut down on ETOH. Strong CAD family history, 3 brothers died from MIs, ages 26, 88 and 40 y/o. Entresto  started. Referred to the [facility].    Echo 5/23 showing interval improvement in LVEF, up to 35-40%. RV moderately reduced, RVSP nl at 20 mmHg.    Follow up 1/24,NYHA I and volume ok. Out of most GDMT. Meds  restarted with plans to repeat echo.  [TY]  [id number] Reports being out of his medications for the past month or so, worsening intermittent chest pain over the past few days with associated cough.  Not having shortness of breath or dyspnea on exertion.  Not having chest pain currently. [TY]  [id number] Case discussed with cardiology; they think is reasonable to give double dose of Lasix  and follow-up outpatient. [TY]    Clinical Course User Index [TY] Neysa Clap PARAS, DO  Medical Decision Making This is a well-appearing 58 year old male presenting emergency department for chest pain.  Was slightly hypertensive and tachycardic on arrival, has since normalized without intervention.  Per chart review does have history of cocaine use as well as CHF.  Does not appear to be in acute decompensated CHF.  Has a mild elevation in his troponin which is stable and appears near baseline.  His BNP is elevated, but only mildly so at 185.  Clinically does not appear to be fluid overloaded.  Chest x-ray is clear.  He is maintaining his oxygen saturation on room air, he has no leukocytosis to suggest systemic infection.  No significant metabolic derangements.  Normal kidney function.  His constellation of symptoms with cough and congestion could have been secondary to viral etiology, however flu/COVID/RSV is negative.  Discussed case with cardiology, do not feel that he needs admission for his chest pain or for diuresis.  Recommending increasing Lasix  dose today and close follow-up outpatient.  Patient informed of plan, agreeable.  Stable for discharge at this time.  Risk Prescription drug management.         Final Clinical Impression(s) / ED Diagnoses Final diagnoses:  None    Rx / DC Orders ED Discharge Orders     None         Neysa Caron PARAS, DO 06/18/23 9044

## 2023-06-18 NOTE — ED Notes (Signed)
Pt given sandwich bag and drink 

## 2023-06-18 NOTE — Discharge Instructions (Addendum)
You have refills of all of your medications at the pharmacy.  Please pick them up and take them.  As discussed follow-up with cardiology.

## 2023-06-20 ENCOUNTER — Emergency Department (HOSPITAL_COMMUNITY): Payer: 59

## 2023-06-20 ENCOUNTER — Emergency Department (HOSPITAL_COMMUNITY)
Admission: EM | Admit: 2023-06-20 | Discharge: 2023-06-20 | Disposition: A | Discharge status: Home / Self Care | Payer: 59 | Attending: Emergency Medicine | Admitting: Emergency Medicine

## 2023-06-20 ENCOUNTER — Other Ambulatory Visit: Payer: Self-pay

## 2023-06-20 DIAGNOSIS — I509 Heart failure, unspecified: Secondary | ICD-10-CM | POA: Diagnosis not present

## 2023-06-20 DIAGNOSIS — R079 Chest pain, unspecified: Secondary | ICD-10-CM | POA: Diagnosis present

## 2023-06-20 DIAGNOSIS — I110 Hypertensive heart disease with heart failure: Secondary | ICD-10-CM | POA: Diagnosis not present

## 2023-06-20 DIAGNOSIS — F1490 Cocaine use, unspecified, uncomplicated: Secondary | ICD-10-CM | POA: Insufficient documentation

## 2023-06-20 DIAGNOSIS — Z79899 Other long term (current) drug therapy: Secondary | ICD-10-CM | POA: Insufficient documentation

## 2023-06-20 DIAGNOSIS — E119 Type 2 diabetes mellitus without complications: Secondary | ICD-10-CM | POA: Diagnosis not present

## 2023-06-20 LAB — CBC
HCT: 43.9 % (ref 39.0–52.0)
Hemoglobin: 15.2 g/dL (ref 13.0–17.0)
MCH: 29.3 pg (ref 26.0–34.0)
MCHC: 34.6 g/dL (ref 30.0–36.0)
MCV: 84.6 fL (ref 80.0–100.0)
Platelets: 239 10*3/uL (ref 150–400)
RBC: 5.19 MIL/uL (ref 4.22–5.81)
RDW: 14.1 % (ref 11.5–15.5)
WBC: 5 10*3/uL (ref 4.0–10.5)
nRBC: 0 % (ref 0.0–0.2)

## 2023-06-20 LAB — BASIC METABOLIC PANEL
Anion gap: 12 (ref 5–15)
BUN: 8 mg/dL (ref 6–20)
CO2: 22 mmol/L (ref 22–32)
Calcium: 9 mg/dL (ref 8.9–10.3)
Chloride: 100 mmol/L (ref 98–111)
Creatinine, Ser: 0.85 mg/dL (ref 0.61–1.24)
GFR, Estimated: >60 mL/min (ref >60)
Glucose, Bld: 77 mg/dL (ref 70–99)
Potassium: 3.9 mmol/L (ref 3.5–5.1)
Sodium: 134 mmol/L — ABNORMAL LOW (ref 135–145)

## 2023-06-20 LAB — TROPONIN I (HIGH SENSITIVITY)
Troponin I (High Sensitivity): 21 ng/L — ABNORMAL HIGH (ref <18)
Troponin I (High Sensitivity): 19 ng/L — ABNORMAL HIGH

## 2023-06-20 LAB — BRAIN NATRIURETIC PEPTIDE: B Natriuretic Peptide: 54.9 pg/mL (ref 0.0–100.0)

## 2023-06-20 NOTE — ED Triage Notes (Addendum)
 Patient arrived with EMS from street reports central chest pain after Cocaine and ETOH this evening . CBG=79. History of CHF.

## 2023-06-20 NOTE — ED Provider Notes (Signed)
 [place] EMERGENCY DEPARTMENT AT [facility] Provider Note   CSN: [id number] Arrival date & time: 06/20/23  0006     History  Chief Complaint  Patient presents with   Chest Pain after Cocaine/ETOH    Joseph Reese is a 58 y.o. male. Patient presents to the emergency department via EMS complaining of chest pain. Patient endorses using crack cocaine and alcohol this evening prior to onset of symptoms. He denies abdominal pain, shortness of breath, nausea, vomiting. He endorses history of HTN, CHF, T2DM, cocaine abuse  HPI     Home Medications Prior to Admission medications   Medication Sig Start Date End Date Taking? Authorizing Provider  Ascorbic Acid (VITAMIN C PO) Take 1 tablet by mouth in the morning.    [provider]  atorvastatin  (LIPITOR) 80 MG tablet Take 1 tablet (80 mg total) by mouth daily. 01/02/23   Bensimhon, Toribio SAUNDERS, MD  carvedilol  (COREG ) 12.5 MG tablet Take 1 tablet (12.5 mg total) by mouth 2 (two) times daily with a meal. 01/02/23   Bensimhon, Toribio SAUNDERS, MD  empagliflozin  (JARDIANCE ) 10 MG TABS tablet Take 1 tablet (10 mg total) by mouth daily before breakfast. 01/03/23   Bensimhon, Toribio SAUNDERS, MD  furosemide  (LASIX ) 40 MG tablet Take 1 tablet (40 mg total) by mouth as needed for fluid or edema. 01/02/23   Bensimhon, Daniel R, MD  Multiple Vitamin (MULTIVITAMIN WITH MINERALS) TABS tablet Take 1 tablet by mouth in the morning.    [provider]  sacubitril -valsartan  (ENTRESTO ) 24-26 MG Take 1 tablet by mouth 2 (two) times daily. 01/02/23   Bensimhon, Toribio SAUNDERS, MD  spironolactone  (ALDACTONE ) 25 MG tablet Take 1 tablet (25 mg total) by mouth daily. 01/02/23   Bensimhon, Toribio SAUNDERS, MD      Allergies    Patient has no known allergies.    Review of Systems   Review of Systems  Physical Exam Updated Vital Signs BP 126/89 (BP Location: Left Arm)   Pulse 70   Temp 97.9 F (36.6 C) (Oral)   Resp 18   SpO2 100%  Physical Exam Vitals and  nursing note reviewed.  Constitutional:      General: He is not in acute distress.    Appearance: He is well-developed.  HENT:     Head: Normocephalic and atraumatic.  Eyes:     Conjunctiva/sclera: Conjunctivae normal.  Cardiovascular:     Rate and Rhythm: Normal rate and regular rhythm.  Pulmonary:     Effort: Pulmonary effort is normal. No respiratory distress.     Breath sounds: Normal breath sounds.  Abdominal:     Palpations: Abdomen is soft.     Tenderness: There is no abdominal tenderness.  Musculoskeletal:        General: No swelling.     Cervical back: Neck supple.  Skin:    General: Skin is warm and dry.     Capillary Refill: Capillary refill takes less than 2 seconds.  Neurological:     Mental Status: He is alert.  Psychiatric:        Mood and Affect: Mood normal.     ED Results / Procedures / Treatments   Labs (all labs ordered are listed, but only abnormal results are displayed) Labs Reviewed  BASIC METABOLIC PANEL - Abnormal; Notable for the following components:      Result Value   Sodium 134 (*)    All other components within normal limits  TROPONIN I (HIGH SENSITIVITY) - Abnormal;  Notable for the following components:   Troponin I (High Sensitivity) 21 (*)    All other components within normal limits  TROPONIN I (HIGH SENSITIVITY) - Abnormal; Notable for the following components:   Troponin I (High Sensitivity) 19 (*)    All other components within normal limits  CBC  BRAIN NATRIURETIC PEPTIDE    EKG EKG Interpretation Date/Time:  Thursday June 20 2023 00:50:49 EST Ventricular Rate:  74 PR Interval:  206 QRS Duration:  104 QT Interval:  484 QTC Calculation: 537 R Axis:   43  Text Interpretation: Normal sinus rhythm Left ventricular hypertrophy with repolarization abnormality ( Sokolow-Lyon , Cornell product , Romhilt-Estes ) Prolonged QT Abnormal ECG When compared with ECG of 06/18/2023, No significant change was found PREVIOUS ECG IS  PRESENT Confirmed by Raford Lenis ([id number]) on 06/20/2023 12:58:23 AM  Radiology DG Chest 2 View Result Date: 06/20/2023 CLINICAL DATA:  Chest pain EXAM: CHEST - 2 VIEW COMPARISON:  06/17/2023 FINDINGS: The heart size and mediastinal contours are within normal limits. Both lungs are clear. The visualized skeletal structures are unremarkable. IMPRESSION: No active cardiopulmonary disease. Electronically Signed   By: Dorethia Molt M.D.   On: 06/20/2023 00:56    Procedures Procedures    Medications Ordered in ED Medications - No data to display  ED Course/ Medical Decision Making/ A&P                                 Medical Decision Making Amount and/or Complexity of Data Reviewed Labs: ordered. Radiology: ordered.   This patient presents to the ED for concern of chest pain, this involves an extensive number of treatment options, and is a complaint that carries with it a high risk of complications and morbidity.  The differential diagnosis includes ACS, pneumonia, side effect of intoxication   Co morbidities that complicate the patient evaluation  Cocaine use, CHF   Additional history obtained:  Additional history obtained from EMS External records from outside source obtained and reviewed including echocardiogram from August   Lab Tests:  I Ordered, and personally interpreted labs.  The pertinent results include: Grossly normal CBC, BMP, BNP; initial troponin 21, repeat troponin 19   Imaging Studies ordered:  I ordered imaging studies including chest x-ray I independently visualized and interpreted imaging which showed no active disease I agree with the radiologist interpretation   Cardiac Monitoring: / EKG:  The patient was maintained on a cardiac monitor.  I personally viewed and interpreted the cardiac monitored which showed an underlying rhythm of: Normal sinus rhythm   Social Determinants of Health:  Patient is a daily tobacco smoker   Test / Admission -  Considered:  Patient with flat troponin, initial 21 follow-up 19.  Nonischemic EKG.  Consider likely demand ischemia related to the recent crack cocaine usage.  Patient no longer having chest pain.  Does not appear to meet criteria for admission or need further emergent workup at this time.  Plan to discharge home.  Patient advised to discontinue use of crack.  Return precautions provided.         Final Clinical Impression(s) / ED Diagnoses Final diagnoses:  Cocaine use  Chest pain, unspecified type    Rx / DC Orders ED Discharge Orders     None         Logan Ubaldo KATHEE DEVONNA 06/20/23 0355    Raford Lenis, MD 06/20/23 [id number]

## 2023-06-20 NOTE — Discharge Instructions (Signed)
 You were evaluated today for chest pain.  Your workup is reassuring.  Your pain is likely due to your crack cocaine usage this evening.  Please refrain from crack cocaine usage as it places demand on the heart.  If you develop any life-threatening symptoms please return to the emergency department.

## 2023-06-23 ENCOUNTER — Other Ambulatory Visit (HOSPITAL_COMMUNITY)
Admission: EM | Admit: 2023-06-23 | Discharge: 2023-06-27 | Disposition: A | Discharge status: Other Acute Inpatient Hospital | Payer: 59 | Attending: Psychiatry | Admitting: Psychiatry

## 2023-06-23 ENCOUNTER — Ambulatory Visit (HOSPITAL_COMMUNITY)
Admission: EM | Admit: 2023-06-23 | Discharge: 2023-06-23 | Disposition: A | Discharge status: Home / Self Care | Payer: 59

## 2023-06-23 DIAGNOSIS — R45851 Suicidal ideations: Secondary | ICD-10-CM | POA: Diagnosis not present

## 2023-06-23 DIAGNOSIS — I5022 Chronic systolic (congestive) heart failure: Secondary | ICD-10-CM

## 2023-06-23 DIAGNOSIS — I2510 Atherosclerotic heart disease of native coronary artery without angina pectoris: Secondary | ICD-10-CM | POA: Diagnosis not present

## 2023-06-23 DIAGNOSIS — I10 Essential (primary) hypertension: Secondary | ICD-10-CM

## 2023-06-23 DIAGNOSIS — Z5986 Financial insecurity: Secondary | ICD-10-CM | POA: Insufficient documentation

## 2023-06-23 DIAGNOSIS — F1020 Alcohol dependence, uncomplicated: Secondary | ICD-10-CM | POA: Diagnosis not present

## 2023-06-23 DIAGNOSIS — F1410 Cocaine abuse, uncomplicated: Secondary | ICD-10-CM | POA: Diagnosis not present

## 2023-06-23 DIAGNOSIS — Z7984 Long term (current) use of oral hypoglycemic drugs: Secondary | ICD-10-CM | POA: Diagnosis not present

## 2023-06-23 DIAGNOSIS — F32A Depression, unspecified: Secondary | ICD-10-CM | POA: Diagnosis not present

## 2023-06-23 DIAGNOSIS — Z5900 Homelessness unspecified: Secondary | ICD-10-CM | POA: Insufficient documentation

## 2023-06-23 DIAGNOSIS — E785 Hyperlipidemia, unspecified: Secondary | ICD-10-CM | POA: Insufficient documentation

## 2023-06-23 DIAGNOSIS — F1010 Alcohol abuse, uncomplicated: Secondary | ICD-10-CM

## 2023-06-23 DIAGNOSIS — E119 Type 2 diabetes mellitus without complications: Secondary | ICD-10-CM

## 2023-06-23 DIAGNOSIS — J9601 Acute respiratory failure with hypoxia: Secondary | ICD-10-CM

## 2023-06-23 DIAGNOSIS — J810 Acute pulmonary edema: Secondary | ICD-10-CM

## 2023-06-23 DIAGNOSIS — F1910 Other psychoactive substance abuse, uncomplicated: Secondary | ICD-10-CM

## 2023-06-23 DIAGNOSIS — F17210 Nicotine dependence, cigarettes, uncomplicated: Secondary | ICD-10-CM | POA: Diagnosis not present

## 2023-06-23 DIAGNOSIS — F1414 Cocaine abuse with cocaine-induced mood disorder: Secondary | ICD-10-CM | POA: Diagnosis not present

## 2023-06-23 DIAGNOSIS — F419 Anxiety disorder, unspecified: Secondary | ICD-10-CM | POA: Insufficient documentation

## 2023-06-23 HISTORY — DX: Other psychoactive substance abuse, uncomplicated: F19.10

## 2023-06-23 LAB — COMPREHENSIVE METABOLIC PANEL
ALT: 16 U/L (ref 0–44)
AST: 22 U/L (ref 15–41)
Albumin: 3.6 g/dL (ref 3.5–5.0)
Alkaline Phosphatase: 54 U/L (ref 38–126)
Anion gap: 8 (ref 5–15)
BUN: 9 mg/dL (ref 6–20)
CO2: 24 mmol/L (ref 22–32)
Calcium: 9.3 mg/dL (ref 8.9–10.3)
Chloride: 107 mmol/L (ref 98–111)
Creatinine, Ser: 0.88 mg/dL (ref 0.61–1.24)
GFR, Estimated: >60 mL/min (ref >60)
Glucose, Bld: 112 mg/dL — ABNORMAL HIGH (ref 70–99)
Potassium: 3.7 mmol/L (ref 3.5–5.1)
Sodium: 139 mmol/L (ref 135–145)
Total Bilirubin: 0.6 mg/dL (ref 0.0–1.2)
Total Protein: 6.8 g/dL (ref 6.5–8.1)

## 2023-06-23 LAB — CBC WITH DIFFERENTIAL/PLATELET
Abs Immature Granulocytes: 0.02 10*3/uL (ref 0.00–0.07)
Basophils Absolute: 0 10*3/uL (ref 0.0–0.1)
Basophils Relative: 0 %
Eosinophils Absolute: 0.3 10*3/uL (ref 0.0–0.5)
Eosinophils Relative: 6 %
HCT: 41.6 % (ref 39.0–52.0)
Hemoglobin: 14.2 g/dL (ref 13.0–17.0)
Immature Granulocytes: 1 %
Lymphocytes Relative: 43 %
Lymphs Abs: 1.9 10*3/uL (ref 0.7–4.0)
MCH: 28.8 pg (ref 26.0–34.0)
MCHC: 34.1 g/dL (ref 30.0–36.0)
MCV: 84.4 fL (ref 80.0–100.0)
Monocytes Absolute: 0.5 10*3/uL (ref 0.1–1.0)
Monocytes Relative: 12 %
Neutro Abs: 1.7 10*3/uL (ref 1.7–7.7)
Neutrophils Relative %: 38 %
Platelets: 224 10*3/uL (ref 150–400)
RBC: 4.93 MIL/uL (ref 4.22–5.81)
RDW: 14.2 % (ref 11.5–15.5)
WBC: 4.4 10*3/uL (ref 4.0–10.5)
nRBC: 0 % (ref 0.0–0.2)

## 2023-06-23 LAB — LIPID PANEL
Cholesterol: 192 mg/dL (ref 0–200)
HDL: 76 mg/dL (ref >40)
LDL Cholesterol: 107 mg/dL — ABNORMAL HIGH (ref 0–99)
Total CHOL/HDL Ratio: 2.5 {ratio}
Triglycerides: 46 mg/dL (ref <150)
VLDL: 9 mg/dL (ref 0–40)

## 2023-06-23 LAB — POCT URINE DRUG SCREEN - MANUAL ENTRY (I-SCREEN)
POC Amphetamine UR: NOT DETECTED
POC Buprenorphine (BUP): NOT DETECTED
POC Cocaine UR: POSITIVE — AB
POC Marijuana UR: POSITIVE — AB
POC Methadone UR: NOT DETECTED
POC Methamphetamine UR: NOT DETECTED
POC Morphine: NOT DETECTED
POC Oxazepam (BZO): NOT DETECTED
POC Oxycodone UR: NOT DETECTED
POC Secobarbital (BAR): NOT DETECTED

## 2023-06-23 LAB — HEMOGLOBIN A1C
Hgb A1c MFr Bld: 5.1 % (ref 4.8–5.6)
Mean Plasma Glucose: 99.67 mg/dL

## 2023-06-23 LAB — ETHANOL: Alcohol, Ethyl (B): <10 mg/dL (ref <10)

## 2023-06-23 LAB — TSH: TSH: 1.229 u[IU]/mL (ref 0.350–4.500)

## 2023-06-23 MED ORDER — THIAMINE HCL 100 MG/ML IJ SOLN: 100.0000 mg | Freq: Once | INTRAMUSCULAR | Status: DC

## 2023-06-23 MED ORDER — THIAMINE MONONITRATE 100 MG PO TABS
100.0000 mg | ORAL_TABLET | Freq: Every day | ORAL | Status: DC
  Administered 2023-06-24 – 2023-06-27 (×4): 100 mg via ORAL
  Filled 2023-06-23: qty 7
  Filled 2023-06-23: qty 1
  Filled 2023-06-23: qty 7
  Filled 2023-06-23 (×3): qty 1

## 2023-06-23 MED ORDER — LOPERAMIDE HCL 2 MG PO CAPS
2.0000 mg | ORAL_CAPSULE | ORAL | Status: AC | PRN
Start: 2023-06-23 — End: 2023-06-26

## 2023-06-23 MED ORDER — MAGNESIUM HYDROXIDE 400 MG/5ML PO SUSP: 30.0000 mL | Freq: Every day | ORAL | Status: DC | PRN

## 2023-06-23 MED ORDER — FUROSEMIDE 40 MG PO TABS
40.0000 mg | ORAL_TABLET | ORAL | Status: DC | PRN
  Filled 2023-06-23: qty 7

## 2023-06-23 MED ORDER — SACUBITRIL-VALSARTAN 24-26 MG PO TABS
1.0000 | ORAL_TABLET | Freq: Two times a day (BID) | ORAL | Status: DC
  Administered 2023-06-23 – 2023-06-27 (×9): 1 via ORAL
  Filled 2023-06-23 (×3): qty 1
  Filled 2023-06-23: qty 14
  Filled 2023-06-23 (×6): qty 1

## 2023-06-23 MED ORDER — CARVEDILOL 12.5 MG PO TABS
12.5000 mg | ORAL_TABLET | Freq: Two times a day (BID) | ORAL | Status: DC
Start: 2023-06-23 — End: 2023-06-25
  Administered 2023-06-23 – 2023-06-24 (×4): 12.5 mg via ORAL
  Filled 2023-06-23 (×4): qty 1

## 2023-06-23 MED ORDER — LIDOCAINE 5 % EX PTCH
1.0000 | MEDICATED_PATCH | CUTANEOUS | Status: DC
  Administered 2023-06-23 – 2023-06-26 (×4): 1 via TRANSDERMAL
  Filled 2023-06-23: qty 1
  Filled 2023-06-23: qty 7
  Filled 2023-06-23 (×3): qty 1

## 2023-06-23 MED ORDER — HYDROXYZINE HCL 25 MG PO TABS
25.0000 mg | ORAL_TABLET | Freq: Four times a day (QID) | ORAL | Status: AC | PRN
  Administered 2023-06-25: 25 mg via ORAL
  Filled 2023-06-23: qty 1

## 2023-06-23 MED ORDER — ALUM & MAG HYDROXIDE-SIMETH 200-200-20 MG/5ML PO SUSP: 30.0000 mL | ORAL | Status: DC | PRN

## 2023-06-23 MED ORDER — NALTREXONE HCL 50 MG PO TABS
50.0000 mg | ORAL_TABLET | Freq: Every day | ORAL | Status: DC
  Administered 2023-06-23 – 2023-06-26 (×4): 50 mg via ORAL
  Filled 2023-06-23: qty 1
  Filled 2023-06-23: qty 7
  Filled 2023-06-23 (×3): qty 1

## 2023-06-23 MED ORDER — SPIRONOLACTONE 25 MG PO TABS
25.0000 mg | ORAL_TABLET | Freq: Every day | ORAL | Status: DC
  Administered 2023-06-23 – 2023-06-27 (×5): 25 mg via ORAL
  Filled 2023-06-23 (×3): qty 1
  Filled 2023-06-23: qty 7
  Filled 2023-06-23 (×2): qty 1

## 2023-06-23 MED ORDER — LORAZEPAM 1 MG PO TABS: 1.0000 mg | ORAL_TABLET | Freq: Four times a day (QID) | ORAL | Status: AC | PRN

## 2023-06-23 MED ORDER — EMPAGLIFLOZIN 10 MG PO TABS
10.0000 mg | ORAL_TABLET | Freq: Every day | ORAL | Status: DC
  Administered 2023-06-23 – 2023-06-27 (×5): 10 mg via ORAL
  Filled 2023-06-23 (×2): qty 1
  Filled 2023-06-23: qty 7
  Filled 2023-06-23 (×4): qty 1

## 2023-06-23 MED ORDER — ATORVASTATIN CALCIUM 40 MG PO TABS
80.0000 mg | ORAL_TABLET | Freq: Every day | ORAL | Status: DC
  Administered 2023-06-23 – 2023-06-27 (×5): 80 mg via ORAL
  Filled 2023-06-23 (×5): qty 2
  Filled 2023-06-23: qty 14

## 2023-06-23 MED ORDER — ACETAMINOPHEN 325 MG PO TABS
650.0000 mg | ORAL_TABLET | Freq: Four times a day (QID) | ORAL | Status: DC | PRN
  Administered 2023-06-25 (×2): 650 mg via ORAL
  Filled 2023-06-23 (×3): qty 2

## 2023-06-23 MED ORDER — ONDANSETRON 4 MG PO TBDP: 4.0000 mg | ORAL_TABLET | Freq: Four times a day (QID) | ORAL | Status: AC | PRN

## 2023-06-23 MED ORDER — TRAZODONE HCL 50 MG PO TABS
50.0000 mg | ORAL_TABLET | Freq: Every evening | ORAL | Status: DC | PRN
  Administered 2023-06-23 – 2023-06-26 (×4): 50 mg via ORAL
  Filled 2023-06-23 (×2): qty 1
  Filled 2023-06-23: qty 7
  Filled 2023-06-23 (×2): qty 1
  Filled 2023-06-23: qty 7

## 2023-06-23 MED ORDER — ADULT MULTIVITAMIN W/MINERALS CH
1.0000 | ORAL_TABLET | Freq: Every day | ORAL | Status: DC
  Administered 2023-06-23 – 2023-06-27 (×5): 1 via ORAL
  Filled 2023-06-23 (×5): qty 1
  Filled 2023-06-23: qty 7

## 2023-06-23 NOTE — Progress Notes (Signed)
   06/23/23 0300  [facility] Triage Screening (Walk-ins at [facility] only)  How Did You Hear About Us ? Self  What Is the Reason for Your Visit/Call Today? Pt reports a long history of alcohol and cocaine use. He states he drinks approximately 12 cans of beer daily and smokes approximately 0.5 grams of crack 3-4 times per week. He last used alcohol and cocaine 4 hours ago. He reports occasional marijuana use. He says he has cardiovascular disease and knows that his substance use is dangerous to his health. He is homeless and unemployed. He describes his mood as severely depressed and he feels guilt and hopelessness. He reports suicidal ideation with no plan. He denies homicidal ideation or psychotic symptoms.  How Long Has This Been Causing You Problems? > than 6 months  Have You Recently Had Any Thoughts About Hurting Yourself? Yes  How long ago did you have thoughts about hurting yourself? Pt reports passive suicidal ideation with no plan or intent to harm himself.  Are You Planning to Commit Suicide/Harm Yourself At This time? No  Have you Recently Had Thoughts About Hurting Someone Sherral? No  Are You Planning To Harm Someone At This Time? No  Physical Abuse Denies  Verbal Abuse Denies  Sexual Abuse Denies  Exploitation of patient/patient's resources Denies  Self-Neglect Denies  Possible abuse reported to: Other (Comment) (Pt denies history of abuse)  Are you currently experiencing any auditory, visual or other hallucinations? No  Have You Used Any Alcohol or Drugs in the Past 24 Hours? Yes  What Did You Use and How Much? Pt reports drinking 48 ounces of beer and 6-7 shots of liquor. He smoked 0.5 grams of crack.  Do you have any current medical co-morbidities that require immediate attention? No  Clinician description of patient physical appearance/behavior: Pt is dressed in soiled clothing. He is alert and oriented x4. Pt speaks in a clear tone, at moderate volume and normal pace. Motor behavior appears  normal. Eye contact is good. Pt's mood is depressed and affect is congruent with mood. Thought process is coherent and relevant. There is no indication he is currently responding to internal stimuli or experiencing delusional thought content. He is friendly and cooperative.  What Do You Feel Would Help You the Most Today? Alcohol or Drug Use Treatment;Treatment for Depression or other mood problem  If access to [facility] was not available, would you have sought care in the Emergency Department? Yes  Determination of Need Urgent (48 hours)  Options For Referral Facility-Based Crisis;BH Urgent Care  Determination of Need filed? Yes

## 2023-06-23 NOTE — ED Notes (Signed)
 Pt is in the dayroom watching TV with peers. Pt denies SI/HI/AVH. Pt has no further complain.No acute distress noted. Will continue to monitor for safety and provide support.

## 2023-06-23 NOTE — ED Provider Notes (Addendum)
 Facility Based Crisis Admission H&P  Date: 06/23/2023 Patient Name: Joseph Reese MRN: [id number] Chief Complaint:  Cocaine and Alcohol Detox    Diagnoses:  Final diagnoses:  Cocaine abuse with cocaine-induced mood disorder (HCC)  Alcohol use disorder, severe, in controlled environment [facility])    HPI:  Joseph Reese is a 58 y.o. male with PMHx of stimulant use disorder-cocaine, alcohol use disorder, systolic heart failure, dyslipidemia, and hypertension being admitted to [facility] for detox from alcohol and cocaine as well as seeking residential rehab.  Patient's primary motivation for quitting alcohol and cocaine is that it is affecting his heart. Per [facility]:  Patient was evaluated face-to-face and his chart was reviewed by this nurse practitioner on 06/23/23. On assessment, patient reports longstanding history of substance abuse. Patient says he has been struggling with alcohol and cocaine abuse for over 40 years. He reports daily consumption of approximately 12 cans of beer and smokes around 0.5 grams of crack cocaine 3-4 times per week. He says his last use of alcohol (2 cans of 24oz beers and 6-7 shots of liquor) and cocaine was approximately 4 hours ago. The patient also notes occasional marijuana use, last use was 4 days ago.   He says he is motivated to seek substance abuse treatment due to his declining health. He has a known history of cardiovascular disease (Heart failure); he reports that he is followed by the [facility] due to the severity of his heart failure and acknowledges that his ongoing substance abuse is harmful to his health. He says that he is depressed due to his struggle with substance abuse, failing health, homelessness, and unemployment. He is endorsing depressive symptoms of isolation, restlessness, persistent feelings of guilt and hopelessness. He admits to suicidal ideation however, he denies any plan or intent to harm himself. He also denies any homicidal ideation,  paranoia, and hallucination.    During assessment patient is alert and oriented x 4.  He is calm and cooperative.  Patient is speaking in a clear tone, normal rate, volume, and pace with good eye contact.  Patient's mood is depressed with a congruent affect.  His thought process is coherent.  Patient's judgment is fair, insight is intact.  Objectively patient did not appear to be responding to any stimuli during this interaction.  Patient's longest period of sobriety was decades ago for approximately 3 years after he had gone to detox and residential rehab.  He denies feeling suicidal anymore and denies HI/AVH.   PHQ 2-9:  Flowsheet Row ED from 06/23/2023 in [facility] Most recent reading at 06/27/2023  8:29 AM ED from 06/23/2023 in [facility] Most recent reading at 06/23/2023  4:18 AM Office Visit from 05/11/2021 in [facility] [place] Most recent reading at 05/11/2021  9:36 AM  Thoughts that you would be better off dead, or of hurting yourself in some way Not at all Several days Not at all  PHQ-9 Total Score 0 19 2       Flowsheet Row ED from 06/27/2023 in [facility] Most recent reading at 06/27/2023  3:58 PM ED from 06/23/2023 in [facility] Most recent reading at 06/26/2023  9:47 AM ED from 06/23/2023 in [facility] Most recent reading at 06/23/2023  4:18 AM  C-SSRS RISK CATEGORY No Risk No Risk Low Risk  Total Time spent with patient: 45 minutes  Musculoskeletal  Strength & Muscle Tone: within normal limits Gait & Station: normal Patient leans: N/A  Psychiatric Specialty Exam  Presentation General Appearance: Appropriate for Environment; Casual   Eye Contact:Good   Speech:Clear and Coherent; Normal Rate   Speech Volume:Normal   Handedness:Right    Mood and Affect   Mood:Euthymic; Irritable   Affect:Appropriate; Congruent    Thought Process  Thought Processes:Coherent; Linear   Descriptions of Associations:Intact   Orientation:Full (Time, Place and Person)   Thought Content:WDL   Hallucinations: denies   Ideas of Reference:None   Suicidal Thoughts: Denies   Homicidal Thoughts: Denies    Sensorium  Memory:Immediate Good; Recent Good   Judgment:Fair   Insight:Good    Executive Functions  Concentration:Good   Attention Span:Good   Recall:Good   Fund of Knowledge:Good   Language:Good    Psychomotor Activity  Psychomotor Activity: Normal    Assets  Assets:Communication Skills; Desire for Improvement; Social Support    Sleep  Sleep: Fair      Physical Exam Vitals and nursing note reviewed.  Constitutional:      General: He is not in acute distress.    Appearance: He is well-developed.  HENT:     Head: Normocephalic and atraumatic.  Eyes:     Conjunctiva/sclera: Conjunctivae normal.  Cardiovascular:     Rate and Rhythm: Normal rate.  Pulmonary:     Effort: No respiratory distress.     Breath sounds: Normal breath sounds.  Abdominal:     Palpations: Abdomen is soft.  Musculoskeletal:        General: No swelling.     Cervical back: Neck supple.  Skin:    General: Skin is warm and dry.     Capillary Refill: Capillary refill takes less than 2 seconds.  Neurological:     Mental Status: He is alert.  Psychiatric:        Mood and Affect: Mood normal.    Review of Systems  Respiratory:  Negative for shortness of breath.   Cardiovascular:  Negative for chest pain.  Gastrointestinal:  Negative for abdominal pain, constipation, diarrhea, heartburn, nausea and vomiting.  Neurological:  Negative for headaches.    Blood pressure 138/89, pulse 63, temperature 98.4 F (36.9 C), temperature source Oral, resp. rate 18, SpO2 100%.    Past Medical History:  Past Medical History:   Diagnosis Date   CHF (congestive heart failure), NYHA class II (HCC)    Cocaine abuse (HCC)    Diabetes mellitus without complication (HCC)    ETOH abuse    Hypertension    Tobacco abuse     Past Surgical History:  Procedure Laterality Date   RIGHT/LEFT HEART CATH AND CORONARY ANGIOGRAPHY N/A 01/18/2023   Procedure: RIGHT/LEFT HEART CATH AND CORONARY ANGIOGRAPHY;  Surgeon: Cherrie Toribio SAUNDERS, MD;  Location: [facility] INVASIVE CV LAB;  Service: Cardiovascular;  Laterality: N/A;    Family History:  Family History  Problem Relation Age of Onset   Diabetes Mellitus II Brother    Coronary artery disease Brother 83       MI   Coronary artery disease Brother 17       MI   Coronary artery disease Brother 32       MI       Last Labs:     Latest Ref Rng & Units 06/23/2023    5:03 AM 06/20/2023   12:12 AM 06/17/2023    9:15 PM  CMP  Glucose 70 - 99 mg/dL 887  77  84   BUN 6 - 20 mg/dL 9  8  5    Creatinine 0.61 - 1.24 mg/dL 9.11  9.14  8.98   Sodium 135 - 145 mmol/L 139  134  137   Potassium 3.5 - 5.1 mmol/L 3.7  3.9  3.4   Chloride 98 - 111 mmol/L 107  100  102   CO2 22 - 32 mmol/L 24  22  23    Calcium  8.9 - 10.3 mg/dL 9.3  9.0  8.9   Total Protein 6.5 - 8.1 g/dL 6.8     Total Bilirubin 0.0 - 1.2 mg/dL 0.6     Alkaline Phos 38 - 126 U/L 54     AST 15 - 41 U/L 22     ALT 0 - 44 U/L 16     CBC    Component Value Date/Time   WBC 4.4 06/23/2023 0503   RBC 4.93 06/23/2023 0503   HGB 14.2 06/23/2023 0503   HCT 41.6 06/23/2023 0503   PLT 224 06/23/2023 0503   MCV 84.4 06/23/2023 0503   MCH 28.8 06/23/2023 0503   MCHC 34.1 06/23/2023 0503   RDW 14.2 06/23/2023 0503   LYMPHSABS 1.9 06/23/2023 0503   MONOABS 0.5 06/23/2023 0503   EOSABS 0.3 06/23/2023 0503   BASOSABS 0.0 06/23/2023 0503    Allergies: Patient has no known allergies.  PTA Medications: (Not in a hospital admission)    Long Term Goals: Improvement in symptoms so as ready for discharge  Short Term Goals: adjust  to milieu  ASSESSMENT  Joseph Reese is a 58 year old male with history of cocaine use disorder, alcohol use disorder, major depressive disorder, systolic heart failure, hypertension, dyslipidemia [facility] management of substance use as well as seeking residential rehab.  He currently appears motivated to stop his substance use.  Patient was amenable to starting naltrexone .  He denies recent opiate use and hepatic function panel within normal limits.  Risk/benefits/side effects were discussed with patient and patient was agreeable to medication trial.  Patient's heart failure medications were all restarted.    Recommendations  Based on my evaluation the patient does not appear to have an emergency medical condition.  Alcohol use disorder Stimulant use disorder-cocaine -Advise cessation -Start naltrexone  50 mg nightly  Chronic systolic heart failure Hypertension Dyslipidemia -Continue Lipitor 80 mg daily -Continue Coreg  12.5 mg twice daily with meals -Replace Farxiga  with Jardiance  10 mg daily -Continue Lasix  40 mg as needed for fluid/edema -Continue Entresto  1 tablet twice daily -Continue Aldactone  25 mg daily  Disposition: Residential Rehab facility  Prentice Espy, MD 07/12/23  3:19 PM

## 2023-06-23 NOTE — ED Provider Notes (Signed)
 Behavioral Health Urgent Care Medical Screening Exam  Patient Name: Joseph Reese MRN: [id number] Date of Evaluation: 06/23/23 Chief Complaint:   Diagnosis:  Final diagnoses:  Substance abuse (HCC)  Alcohol abuse  Cocaine abuse (HCC)    History of Present illness: Joseph Reese is a 58 y.o. male psychiatric history of cocaine abuse, alcohol abuse, depression and medical history of systolic heart failure and hypertension.  Patient presented voluntarily to the [facility] requesting substance abuse treatment.  Patient was evaluated face-to-face and his chart was reviewed by this nurse practitioner on 06/23/23. On assessment, patient reports longstanding history of substance abuse. Patient says he has been struggling with alcohol and cocaine abuse for over 40 years. He reports daily consumption of approximately 12 cans of beer and smokes around 0.5 grams of crack cocaine 3-4 times per week. He says his last use of alcohol (2 cans of 24oz beers and 6-7 shots of liquor) and cocaine was approximately 4 hours ago. The patient also notes occasional marijuana use, last use was 4 days ago.  He says he is motivated to seek substance abuse treatment due to his declining health. He has a known history of cardiovascular disease (Heart failure); he reports that he is followed by the [facility] due to the severity of his heart failure and acknowledges that his ongoing substance abuse is harmful to his health. He says that he is depressed due to his struggle with substance abuse, failing health, homelessness, and unemployment. He is endorsing depressive symptoms of isolation, restlessness, persistent feelings of guilt and hopelessness. He admits to suicidal ideation however, he denies any plan or intent to harm himself. He also denies any homicidal ideation, paranoia, and hallucination.   During assessment patient is alert and oriented x 4.  He is calm and cooperative.  Patient is speaking in a clear tone, normal  rate, volume, and pace with good eye contact.  Patient's mood is depressed with a congruent affect.  His thought process is coherent.  Patient's judgment is fair, insight is intact.  Objectively patient did not appear to be responding to any stimuli during this interaction.  Flowsheet Row ED from 06/23/2023 in [facility] Most recent reading at 06/23/2023  5:47 AM ED from 06/23/2023 in [facility] Most recent reading at 06/23/2023  4:18 AM ED from 06/20/2023 in [facility] Emergency Department at [facility] Most recent reading at 06/20/2023 12:10 AM  C-SSRS RISK CATEGORY No Risk Low Risk No Risk       Psychiatric Specialty Exam  Presentation  General Appearance:No data recorded Eye Contact:No data recorded Speech:No data recorded Speech Volume:No data recorded Handedness:No data recorded  Mood and Affect  Mood:No data recorded Affect:No data recorded  Thought Process  Thought Processes:No data recorded Descriptions of Associations:No data recorded Orientation:No data recorded Thought Content:No data recorded Diagnosis of Schizophrenia or Schizoaffective disorder in past: No   Hallucinations:No data recorded Ideas of Reference:No data recorded Suicidal Thoughts:No data recorded Homicidal Thoughts:No data recorded  Sensorium  Memory:No data recorded Judgment:No data recorded Insight:No data recorded  Executive Functions  Concentration:No data recorded Attention Span:No data recorded Recall:No data recorded Fund of Knowledge:No data recorded Language:No data recorded  Psychomotor Activity  Psychomotor Activity:No data recorded  Assets  Assets:No data recorded  Sleep  Sleep:No data recorded Number of hours: No data recorded  Physical Exam: Physical Exam Vitals and nursing note reviewed.  Constitutional:      General: He is not in  acute distress.    Appearance: He is well-developed.  HENT:     Head:  Normocephalic and atraumatic.  Eyes:     Conjunctiva/sclera: Conjunctivae normal.  Cardiovascular:     Rate and Rhythm: Normal rate.  Pulmonary:     Effort: Pulmonary effort is normal.  Abdominal:     Palpations: Abdomen is soft.     Tenderness: There is no abdominal tenderness.  Musculoskeletal:        General: Normal range of motion.     Cervical back: Normal range of motion.  Skin:    General: Skin is warm.  Neurological:     Mental Status: He is alert.  Psychiatric:        Attention and Perception: Attention and perception normal.        Mood and Affect: Mood is anxious and depressed.        Speech: Speech normal.        Behavior: Behavior normal. Behavior is cooperative.        Thought Content: Thought content includes suicidal ideation. Thought content does not include suicidal plan.    Review of Systems  Constitutional: Negative.   HENT: Negative.    Eyes: Negative.   Respiratory: Negative.    Cardiovascular: Negative.   Gastrointestinal: Negative.   Genitourinary: Negative.   Musculoskeletal: Negative.   Skin: Negative.   Neurological: Negative.   Endo/Heme/Allergies: Negative.   Psychiatric/Behavioral:  Positive for depression and substance abuse.    Blood pressure 115/82, pulse 83, temperature 98.5 F (36.9 C), temperature source Oral, resp. rate 18, SpO2 100%. There is no height or weight on file to calculate BMI.  Musculoskeletal: Strength & Muscle Tone: within normal limits Gait & Station: normal Patient leans: Right   [facility] Discharge Disposition for Follow up and Recommendations: Patient will be admitted to [facility] for stabilization, alcohol detox, and substance abuse treatment.  Initiate Ciwa protocol at [facility] -lorazepam  1 mg every 6 hours prn for CIWA >10 -thiamine  100 mg daily for nutritional supplementation -hydroxyzine  25 mg every 6 hours prn for anxiety, CIWA < or = 10 -ondansetron  4 mg ODT every 6 hours prn nausea/vomiting -loperamide  2-4 mg  capsule prn diarrhea or loose stools  Will resume home medications   Karanveer Ramakrishnan A Raigan Baria, NP 06/23/2023, 6:06 AM

## 2023-06-23 NOTE — ED Notes (Signed)
 Pt presents to [facility] to get help with , Cocaine  and Alcohol abuse. Admission process completed. Pt is oriented to the unit and provided with meal but refused.. Pt denies SI/HI/AVH. Will continue to monitor for safety and provide support.

## 2023-06-23 NOTE — Group Note (Signed)
 Group Topic: Relapse and Recovery  Group Date: 06/23/2023 Start Time: 1000 End Time: 1100 Facilitators: Lonzell Dwayne RAMAN, NT +3 MHT 2 Department: [facility]  Number of Participants: 7  Group Focus: chemical dependency issues Treatment Modality:  Psychoeducation Interventions utilized were group exercise Purpose: relapse prevention strategies  Name: Joseph Reese Date of Birth: 12-30-1965  MR: [id number]    Level of Participation: active Quality of Participation: attentive Interactions with others: gave feedback Mood/Affect: appropriate Triggers (if applicable): N/A  Cognition: coherent/clear Progress: Minimal Response: appropriate  Plan: patient will be encouraged to be positive and continue to work on himself.  Patients Problems:  Patient Active Problem List   Diagnosis Date Noted   Substance abuse (HCC) 06/23/2023   Acute respiratory failure with hypoxia (HCC) 05/06/2021   Acute pulmonary edema (HCC) 05/06/2021   Hypertensive emergency

## 2023-06-23 NOTE — ED Notes (Signed)
 Patient asked for medication for back pain.  Tylenol  ordered and offered however patient stated, "that ain't gonna work, Government social research officer my time."  He refused the medication.

## 2023-06-23 NOTE — Group Note (Signed)
 Group Topic: Recovery Basics  Group Date: 06/23/2023 Start Time: 1230 End Time: 1300 Facilitators: Stanly Stabile, RN  Department: [facility]  Number of Participants: 8  Group Focus: chemical dependency education, chemical dependency issues, clarity of thought, and coping skills Treatment Modality:  Behavior Modification Therapy Interventions utilized were clarification, exploration, and patient education Purpose: enhance coping skills, explore maladaptive thinking, express feelings, express irrational fears, reinforce self-care, and relapse prevention strategies  Name: Joseph Reese Date of Birth: April 05, 1966  MR: [id number]    Level of Participation: moderate Quality of Participation: attentive and cooperative Interactions with others: gave feedback Mood/Affect: appropriate Triggers (if applicable):   Cognition: coherent/clear and goal directed Progress: Gaining insight Response:   Plan: follow-up needed  Patients Problems:  Patient Active Problem List   Diagnosis Date Noted   Substance abuse (HCC) 06/23/2023   Acute respiratory failure with hypoxia (HCC) 05/06/2021   Acute pulmonary edema (HCC) 05/06/2021   Hypertensive emergency

## 2023-06-23 NOTE — ED Notes (Signed)
 Patient asleep in bed no distress or withdrawal.  Will monitor.

## 2023-06-23 NOTE — Group Note (Signed)
 Group Topic: Communication  Group Date: 06/23/2023 Start Time: 1945 End Time: 2100 Facilitators: Joan Plowman B  Department: [facility]  Number of Participants: 9  Group Focus: check in Treatment Modality:  Individual Therapy Interventions utilized were support Purpose: express feelings  Name: Joseph Reese Date of Birth: October 27, 1965  MR: [id number]    Level of Participation: active Quality of Participation: attentive Interactions with others: gave feedback Mood/Affect: appropriate Triggers (if applicable): NA Cognition: coherent/clear Progress: Gaining insight Response: NA Plan: patient will be encouraged to keep going to groups.   Patients Problems:  Patient Active Problem List   Diagnosis Date Noted   Substance abuse (HCC) 06/23/2023   Acute respiratory failure with hypoxia (HCC) 05/06/2021   Acute pulmonary edema (HCC) 05/06/2021   Hypertensive emergency

## 2023-06-23 NOTE — BH Assessment (Addendum)
 Comprehensive Clinical Assessment (CCA) Note  06/23/2023 Alm Molt [id number]  DISPOSITION: Gave clinical report to Kathryne Show, NP who completed MSE and admitted Pt to [facility].  The patient demonstrates the following risk factors for suicide: Chronic risk factors for suicide include: substance use disorder, medical illness cardiovascular disease, and demographic factors (male, >60 y/o). Acute risk factors for suicide include: unemployment, social withdrawal/isolation, and loss (financial, interpersonal, professional). Protective factors for this patient include: responsibility to others (children, family). Considering these factors, the overall suicide risk at this point appears to be moderate. Patient is not appropriate for outpatient follow up.  Pt is a 58 year old divorced male who presents unaccompanied to [facility] requesting treatment for substance use and depressive symptoms. Pt reports a long history of substance use and says he is seeking treatment at this time because he feels he has let his family down, he is exacerbating his medical problems, and he is dissatisfied with his life. He reports drinking approximately 12 cans of beer daily and tonight drank 48 ounces of beer and 6-7 shots of liquor. He reports smoking approximately 0.5 grams of cocaine (crack) and tonight smoked 0.5 grams. He states he smokes approximately 1 blunt of marijuana 1-2 times per week, last use 4 days ago. He endorses a history of alcohol withdrawal symptoms including tremors, sweating, and nausea with no history of seizures. He says his longest period of sobriety is 3 years.  Pt describes his mood as severely depressed. Pt acknowledges symptoms including crying spells, social withdrawal, loss of interest in usual pleasures, fatigue, decreased concentration, decreased sleep and feelings of guilt and hopelessness. He denies problems with sleep or appetite. He says he is having crazy thoughts of killing myself with no plan or  intent. He denies history of suicide attempts. He denies any history of intentional self-injurious behaviors. He denies current homicidal ideation or history of violence. He denies any history of auditory or visual hallucinations.   Pt identifies several stressors. He states he is currently homeless. 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 He has cardiovascular disease with chronic systolic heart failure. Per medical record, three of Pt's brothers have died from myocardial infarction at ages 44, 49, and 85. Pt says he knows that his substance use is dangerous given his medical condition. He says he has family support including 5 children and 3 grandchildren. He denies history of abuse. He denies current legal problems. He denies access to firearms, explaining he is a convicted felon.   Pt says he has no mental health providers. He denies ever receiving mental health treatment. He states he received substance abuse [facility] approximately [facility] [place], [place].  Pt is dressed in soiled clothing. He is alert and oriented x4. Pt speaks in a clear tone, at moderate volume and normal pace. Motor behavior appears normal. Eye contact is good. Pt's mood is depressed and affect is congruent with mood. Thought process is coherent and relevant. There is no indication he is currently responding to internal stimuli or experiencing delusional thought content. He is friendly and cooperative.   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 Self  What  Is the Reason for Your Visit/Call Today? Pt reports a long history of alcohol and cocaine use. He  states he drinks approximately 12 cans of beer daily and smokes approximately 0.5 grams of crack 3-4 times per week. He last used alcohol and cocaine 4 hours ago. He reports occasional marijuana use. He says he has cardiovascular disease and knows that his substance use is dangerous to his health. He is homeless and unemployed. He describes his mood as severely depressed and he feels guilt and hopelessness. He reports suicidal ideation with no plan. He denies homicidal ideation or psychotic symptoms.  How Long Has This Been Causing You Problems? > than 6 months  What Do You Feel Would Help You the Most Today? Alcohol or Drug Use Treatment; Treatment for Depression or other mood problem   Have You Recently Had Any Thoughts About Hurting Yourself? Yes  Are You Planning to Commit Suicide/Harm Yourself At This time? No   Flowsheet Row ED from 06/23/2023 in [facility] Most recent reading at 06/23/2023  4:27 AM ED from 06/23/2023 in [facility] Most recent reading at 06/23/2023  4:18 AM ED from 06/20/2023 in [facility] Emergency Department at [facility] Most recent reading at 06/20/2023 12:10 AM  C-SSRS RISK CATEGORY Low Risk Low Risk No Risk       Have you Recently Had Thoughts About Hurting Someone Sherral? No  Are You Planning to Harm Someone at This Time? No  Explanation: Pt denies thoughts of harming anyone.  Have You Used Any Alcohol or Drugs in the Past 24 Hours? Yes  How Long Ago Did You Use Drugs or Alcohol? Since adolescence. What Did You Use and How Much? Pt reports drinking 48 ounces of beer and 6-7 shots of liquor. He smoked 0.5 grams of crack.   Do You Currently Have a Therapist/Psychiatrist? No Name of Therapist/Psychiatrist:    Have You Been Recently Discharged From Any Office Practice or Programs? No  Explanation of Discharge From Practice/Program: No    CCA Screening Triage Referral Assessment Type of Contact:  In person Telemedicine Service Delivery:  NA Is this Initial or Reassessment?  NA Date Telepsych consult ordered in [facility]:  NA  Time Telepsych consult ordered in [facility]:   NA Location of Assessment: [facility] Provider Location: [facility]  Collateral Involvement: None  Does Patient Have a Automotive Engineer Guardian? No  Legal Guardian Contact Information: Pt does not have a legal guardian Copy of Legal Guardianship Form: Pt does not have a legal guardian Legal Guardian Notified of Arrival: Pt does not have a legal guardian Legal Guardian Notified of Pending Discharge: Pt does not have a legal guardian If Minor and Not Living with Parent(s), Who has Custody? Pt is an adult Is CPS involved or ever been involved? No Is APS involved or ever been involved? No  Patient Determined To Be At Risk for Harm To Self or Others Based on Review of Patient Reported Information or Presenting Complaint? No Method: None Availability of Means: None Intent: None Notification Required: No Additional Information for Danger to Others Potential: No Additional Comments for Danger to Others Potential: None Are There Guns or Other Weapons in Your Home? No Types of Guns/Weapons: None Are These Weapons Safely Secured?                            Pt has no weapons Who Could Verify You  Are Able To Have These Secured: Pt has no weapons Do You Have any Outstanding Charges, Pending Court Dates, Parole/Probation? No Contacted To Inform of Risk of Harm To Self or Others: Not necessary   Does Patient Present under Involuntary Commitment? No   [place] of Residence: [place]  Patient Currently Receiving the Following Services: No   Determination of Need: Urgent (48 hours)   Options For Referral: Facility-Based Crisis; BH Urgent Care     CCA Biopsychosocial Patient Reported Schizophrenia/Schizoaffective Diagnosis in Past: No   Strengths: Pt is motivated for treatment.   Mental Health  Symptoms Depression:  Change in energy/activity; Fatigue; Hopelessness; Tearfulness; Worthlessness   Duration of Depressive symptoms: Duration of Depressive Symptoms: Greater than two weeks   Mania:  None   Anxiety:   Worrying; Difficulty concentrating; Fatigue; Tension   Psychosis:  None   Duration of Psychotic symptoms:    Trauma:  None   Obsessions:  None   Compulsions:  None   Inattention:  None   Hyperactivity/Impulsivity:  None   Oppositional/Defiant Behaviors:  None   Emotional Irregularity:  None   Other Mood/Personality Symptoms:  None noted    Mental Status Exam Appearance and self-care  Stature:  Average   Weight:  Average weight   Clothing:  Dirty; Disheveled   Grooming:  Neglected   Cosmetic use:  None   Posture/gait:  Normal   Motor activity:  Not Remarkable   Sensorium  Attention:  Normal   Concentration:  Normal   Orientation:  X5   Recall/memory:  Normal   Affect and Mood  Affect:  Depressed   Mood:  Depressed   Relating  Eye contact:  Normal   Facial expression:  Responsive   Attitude toward examiner:  Cooperative   Thought and Language  Speech flow: Normal   Thought content:  Appropriate to Mood and Circumstances   Preoccupation:  None   Hallucinations:  None   Organization:  Coherent   [employer] of Knowledge:  Average   Intelligence:  Average   Abstraction:  Normal   Judgement:  Impaired   Dance Movement Psychotherapist:  Realistic   Insight:  Gaps   Decision Making:  Normal   Social Functioning  Social Maturity:  Isolates   Social Judgement:  Normal   Stress  Stressors:  Housing; Surveyor, Quantity; Work; Illness   Coping Ability:  Exhausted   Skill Deficits:  None   Supports:  Family     Religion: Religion/Spirituality Are You A Religious Person?: No How Might This Affect Treatment?: NA  Leisure/Recreation: Leisure / Recreation Do You Have Hobbies?: Yes Leisure and Hobbies: Fishing,  reading, watching television  Exercise/Diet: Exercise/Diet Do You Exercise?: No Have You Gained or Lost A Significant Amount of Weight in the Past Six Months?: Yes-Lost Number of Pounds Lost?: 60 (Pt reports losing weight at his doctor's recommendation.) Do You Follow a Special Diet?: No Do You Have Any Trouble Sleeping?: No   CCA Employment/Education Employment/Work Situation: Employment / Work Situation Employment Situation: Unemployed Patient's Job has Been Impacted by Current Illness: No Has Patient ever Been in Equities Trader?: No  Education: Education Is Patient Currently Attending School?: No Last Grade Completed:  (GED) Did You Attend College?: No Did You Have An Individualized Education Program (IIEP): No Did You Have Any Difficulty At School?: No Patient's Education Has Been Impacted by Current Illness: No   CCA Family/Childhood History Family and Relationship History: Family history Marital status: Divorced Divorced, when?: Years ago What  types of issues is patient dealing with in the relationship?: None Additional relationship information: None Does patient have children?: Yes How many children?: 5 How is patient's relationship with their children?: Pt has 4 daughter, 1 son, 3 grandchildren  Childhood History:  Childhood History By whom was/is the patient raised?: Both parents Did patient suffer any verbal/emotional/physical/sexual abuse as a child?: No Did patient suffer from severe childhood neglect?: No Has patient ever been sexually abused/assaulted/raped as an adolescent or adult?: No Was the patient ever a victim of a crime or a disaster?: No Witnessed domestic violence?: No Has patient been affected by domestic violence as an adult?: No       CCA Substance Use Alcohol/Drug Use: Alcohol / Drug Use Pain Medications: Denies abuse Prescriptions: Denies abuse Over the Counter: Denies abuse History of alcohol / drug use?: Yes Longest period of  sobriety (when/how long): 3 years Negative Consequences of Use: Financial, Armed Forces Operational Officer, Personal relationships, Work / School Withdrawal Symptoms: Sweats, Tremors, Nausea / Vomiting Substance #1 Name of Substance 1: Alcohol 1 - Age of First Use: Adolescent 1 - Amount (size/oz): Approximately 12 cans of beer 1 - Frequency: Daily 1 - Duration: Ongoing for years 1 - Last Use / Amount: Today: 48 ounces of beer and 6-7 shots of liquor 1 - Method of Aquiring: Store purchase 1- Route of Use: Oral ingestion Substance #2 Name of Substance 2: Cocaine (crack) 2 - Age of First Use: 20s 2 - Amount (size/oz): Approximately 0.5 grams 2 - Frequency: 3-4 times per week 2 - Duration: Ongoing for years 2 - Last Use / Amount: Today: 0.5 grams 2 - Method of Aquiring: Dealer 2 - Route of Substance Use: Smoke inhalation Substance #3 Name of Substance 3: Marijuana 3 - Age of First Use: Adolescent 3 - Amount (size/oz): 1 blunt 3 - Frequency: 1-2 times per week 3 - Duration: Ongoing for years 3 - Last Use / Amount: 06/18/2022 3 - Method of Aquiring: Dealer 3 - Route of Substance Use: Smoke inhalation                   ASAM's:  Six Dimensions of Multidimensional Assessment  Dimension 1:  Acute Intoxication and/or Withdrawal Potential:   Dimension 1:  Description of individual's past and current experiences of substance use and withdrawal: Pt reports daily use of alcohol and experiences withdrawal symptoms when he stops. Also uses cocaine and marijuana.  Dimension 2:  Biomedical Conditions and Complications:   Dimension 2:  Description of patient's biomedical conditions and  complications: Pt has cardiovascular disease w/ h/o chronic systolic heart failure  Dimension 3:  Emotional, Behavioral, or Cognitive Conditions and Complications:  Dimension 3:  Description of emotional, behavioral, or cognitive conditions and complications: Pt reports depressive symptoms including suicidal ideation.  Dimension 4:   Readiness to Change:  Dimension 4:  Description of Readiness to Change criteria: Pt says he is motivated for treatment  Dimension 5:  Relapse, Continued use, or Continued Problem Potential:  Dimension 5:  Relapse, continued use, or continued problem potential critiera description: Pt's longest period of sobriety is 3 years  Dimension 6:  Recovery/Living Environment:  Dimension 6:  Recovery/Iiving environment criteria description: Pt is homeless  ASAM Severity Score: ASAM's Severity Rating Score: 13  ASAM Recommended Level of Treatment: ASAM Recommended Level of Treatment: Level III Residential Treatment   Substance use Disorder (SUD) Substance Use Disorder (SUD)  Checklist Symptoms of Substance Use: Continued use despite having a persistent/recurrent physical/psychological problem caused/exacerbated  by use, Continued use despite persistent or recurrent social, interpersonal problems, caused or exacerbated by use, Evidence of tolerance, Large amounts of time spent to obtain, use or recover from the substance(s), Persistent desire or unsuccessful efforts to cut down or control use, Presence of craving or strong urge to use, Recurrent use that results in a failure to fulfill major role obligations (work, school, home), Repeated use in physically hazardous situations, Social, occupational, recreational activities given up or reduced due to use, Substance(s) often taken in larger amounts or over longer times than was intended  Recommendations for Services/Supports/Treatments: Recommendations for Services/Supports/Treatments Recommendations For Services/Supports/Treatments: Facility Based Crisis  Disposition Recommendation per psychiatric provider: We recommend transfer to [facility].   DSM5 Diagnoses: Patient Active Problem List   Diagnosis Date Noted   Acute respiratory failure with hypoxia (HCC) 05/06/2021   Acute pulmonary edema (HCC) 05/06/2021   Hypertensive  emergency      Referrals to Alternative Service(s): Referred to Alternative Service(s):   Place:   Date:   Time:    Referred to Alternative Service(s):   Place:   Date:   Time:    Referred to Alternative Service(s):   Place:   Date:   Time:    Referred to Alternative Service(s):   Place:   Date:   Time:     Vivi Mickey [place], [facility]

## 2023-06-23 NOTE — Care Management (Signed)
 [facility] Care Management   Writer referred patient to: [employer]

## 2023-06-24 DIAGNOSIS — F1414 Cocaine abuse with cocaine-induced mood disorder: Secondary | ICD-10-CM | POA: Diagnosis not present

## 2023-06-24 MED ORDER — MENTHOL 3 MG MT LOZG
1.0000 | LOZENGE | OROMUCOSAL | Status: DC | PRN
  Administered 2023-06-24 – 2023-06-25 (×2): 3 mg via ORAL
  Filled 2023-06-24 (×2): qty 9

## 2023-06-24 NOTE — Group Note (Signed)
 Group Topic: Positive Affirmations  Group Date: 06/24/2023 Start Time: 1300 End Time: 1330 Facilitators: Arlan Belling, RN  Department: [facility]  Number of Participants: 7  Group Focus: coping skills Treatment Modality:  Behavior Modification Therapy Interventions utilized were patient education Purpose: express feelings, express irrational fears, improve communication skills, increase insight, regain self-worth, and reinforce self-care  Name: Joseph Reese Date of Birth: 04/19/1966  MR: [id number]    Level of Participation: minimal Quality of Participation: attentive and cooperative Interactions with others: gave feedback Mood/Affect: appropriate Triggers (if applicable):   Cognition: coherent/clear Progress: Gaining insight Response:   Plan: follow-up needed  Patients Problems:  Patient Active Problem List   Diagnosis Date Noted   Substance abuse (HCC) 06/23/2023   Acute respiratory failure with hypoxia (HCC) 05/06/2021   Acute pulmonary edema (HCC) 05/06/2021   Hypertensive emergency

## 2023-06-24 NOTE — Discharge Instructions (Addendum)
 Patient will be discharging to [facility] on tomorrow 06/27/2023 by 9:00am with transportation provided via Taxi. Address is [address] [place], [place] [address].   [facility] [address], [place], [address] [phone] phone  New Patient Assessment/Therapy Walk-Ins:  Monday and Wednesday: 8 am until slots are full. Every 1st and 2nd Fridays of the month: 1 pm - 5 pm.  NO ASSESSMENT/THERAPY WALK-INS ON TUESDAYS OR THURSDAYS  New Patient Assessment/Medication Management Walk-Ins:  Monday - Friday:  8 am - 11 am.  For all walk-ins, we ask that you arrive by 7:30 am because patients will be seen in the order of arrival.  Availability is limited; therefore, you may not be seen on the same day that you walk-in.  Our goal is to serve and meet the needs of our community to the best of our [place] ability.  SUBSTANCE USE TREATMENT for Medicaid and State Funded/IPRS  [facility] [address], [place], [address] [phone] phone NOTE: [facility] is no longer offering IOP services.  Serves those who are low-income or have no insurance.  [facility] [address], [place], [place], [address] [phone] phone [phone] fax NOTE: Does have Substance Abuse-Intensive Outpatient Program [facility]) as well as transitional housing if eligible.  [facility] [address] [place], [place], [address] [phone] phone [phone] fax  [facility] [phone] [place], [place], [address] [phone] phone [phone] fax  HALFWAY HOUSES:  [facility] [phone]  [employer].oxfordvacancies.com  12 STEP PROGRAMS:  Alcoholics Anonymous of [place] SoftwareChalet.be  Narcotics Anonymous of [place] HitProtect.dk  Al-Anon of [place], [place] www.[place]alanon.org/find-meetings.html  Nar-Anon  https://nar-anon.org/find-a-meetin  List of Residential placements:   [facility] in [place]: [phone]  [facility]: [phone]  [place], [place] [phone]: Male and male facility; 30-day program: (uninsured and Medicaid such as Laurena Bering, [place], [place], partners)  [facility]: [phone]; men and women's facility; 28 days; Can have Medicaid tailored plan Tour manager or Partners)  [facility]: [phone] Karoline Caldwell or Larita Fife; 28 day program; must be fully detox; tailored Medicaid or no insurance  [address] in [place], [place]; [phone]; 28 day all males program; no insurance accepted  [facility] Referral in [place]: Gabriel Rung [phone] (no insurance or Medicaid only); 90 days; outpatient services but provide housing in apartments downtown [place]  [facility] Admission: [phone]: Patient must complete phone screening for placement: [place], [place]; 6 month program; uninsured, Medicaid, and [employer].   [facility]: no insurance required; [phone]  [facility]: [phone]; Intake: Molly Maduro; Must fill out application online; Alecia Lemming Delay [phone] x [address] Mission in [place], [place]: [phone]; Admissions Coordinators Mr. Maurine Minister or Brenen Beigel; 90 day program.  [facility]: [place], [place] [phone]; Co-Ed 9 month to a year program; Online application; Men entry fee is $500 (6-49months);  [employer]: [address] [place], [place] [address]; no fee or insurance required; minimum of 2 years; Highly structured; work based; Intake Coordinator is Thayer Ohm [phone]  [facility] in [place], [place]: [phone]; Fax number is [phone]; website: www.[facility].org; Requires 3-6 page autobiography; 2 year program (18 months and then 44month transitional housing); Admission fee is $300; no insurance needed; work  Automotive engineer in [place], [place]: [employer] Desk Staff: Danise Edge [phone]: They have a Men's Regenerations Program 6-73months. Free program; There is an initial $300 fee however, they are  willing to work with patients regarding that. Application is online.  [facility]: Admissions [phone] Doran Heater ext [phone]; Any 7-90 day program is out of pocket; 12 month program is free of charge; there is a $275 entry fee; Patient is responsible for own transportation

## 2023-06-24 NOTE — ED Provider Notes (Signed)
 Behavioral Health Progress Note  Date and Time: 06/24/2023 1:11 PM Name: Joseph Reese MRN:  [id number]  Subjective:  Joseph Reese is a 58 y.o. male with PMHx of stimulant use disorder-cocaine, alcohol use disorder, systolic heart failure, dyslipidemia, and hypertension being admitted to [facility] for detox from alcohol and cocaine as well as seeking residential rehab.   Patient's primary motivation for quitting alcohol and cocaine is that it is affecting his heart. Per [facility] MSE:  Today Case discussed with staff and social worker.  Patient seen during rounds.  Patient continues to endorse depressed mood, and anhedonia.  Patient reports that he is stressed about life in general.  He has off-and-on suicidal thoughts, denies any intention to harm himself on the unit.  He talked about him using alcohol and cocaine.  Patient patient said that he is motivated to stay sober.  Patient said that he uses drugs as an escape from his stressors.  Patient was encouraged to attend groups and work on healthy coping strategies.  Patient is working with Child psychotherapist on rehab placement  Diagnosis:  Final diagnoses:  Cocaine abuse with cocaine-induced mood disorder (HCC)  Alcohol use disorder, severe, in controlled environment (HCC)                       Sleep: Fair  Appetite:  Fair  Current Medications:  Current Facility-Administered Medications  Medication Dose Route Frequency Provider Last Rate Last Admin   acetaminophen  (TYLENOL ) tablet 650 mg  650 mg Oral Q6H PRN Ajibola, Ene A, NP       alum & mag hydroxide-simeth (MAALOX/MYLANTA) 200-200-20 MG/5ML suspension 30 mL  30 mL Oral Q4H PRN Ajibola, Ene A, NP       atorvastatin  (LIPITOR) tablet 80 mg  80 mg Oral Daily Ajibola, Ene A, NP   80 mg at 06/24/23 0865   carvedilol  (COREG ) tablet 12.5 mg  12.5 mg Oral BID WC Ajibola, Ene A, NP   12.5 mg at 06/24/23 7846   empagliflozin  (JARDIANCE ) tablet 10 mg  10 mg Oral QAC breakfast Ajibola, Ene A, NP   10 mg at  06/24/23 9629   furosemide  (LASIX ) tablet 40 mg  40 mg Oral PRN Ajibola, Ene A, NP       hydrOXYzine  (ATARAX ) tablet 25 mg  25 mg Oral Q6H PRN Ajibola, Ene A, NP       lidocaine  (LIDODERM ) 5 % 1 patch  1 patch Transdermal Q24H Augusta Blizzard, MD   1 patch at 06/24/23 5284   loperamide  (IMODIUM ) capsule 2-4 mg  2-4 mg Oral PRN Ajibola, Ene A, NP       LORazepam  (ATIVAN ) tablet 1 mg  1 mg Oral Q6H PRN Ajibola, Ene A, NP       magnesium  hydroxide (MILK OF MAGNESIA) suspension 30 mL  30 mL Oral Daily PRN Ajibola, Ene A, NP       multivitamin with minerals tablet 1 tablet  1 tablet Oral Daily Ajibola, Ene A, NP   1 tablet at 06/24/23 0933   naltrexone  (DEPADE) tablet 50 mg  50 mg Oral QHS Ji, Andrew, MD   50 mg at 06/23/23 2110   ondansetron  (ZOFRAN -ODT) disintegrating tablet 4 mg  4 mg Oral Q6H PRN Ajibola, Ene A, NP       sacubitril -valsartan  (ENTRESTO ) 24-26 mg per tablet  1 tablet Oral BID Ajibola, Ene A, NP   1 tablet at 06/24/23 0934   spironolactone  (ALDACTONE ) tablet 25 mg  25 mg  Oral Daily Ajibola, Ene A, NP   25 mg at 06/24/23 7829   thiamine  (VITAMIN B1) injection 100 mg  100 mg Intramuscular Once Ajibola, Ene A, NP       thiamine  (VITAMIN B1) tablet 100 mg  100 mg Oral Daily Ajibola, Ene A, NP   100 mg at 06/24/23 0933   traZODone  (DESYREL ) tablet 50 mg  50 mg Oral QHS PRN Ajibola, Ene A, NP   50 mg at 06/23/23 2110   Current Outpatient Medications  Medication Sig Dispense Refill   atorvastatin  (LIPITOR) 80 MG tablet Take 1 tablet (80 mg total) by mouth daily. 90 tablet 3   carvedilol  (COREG ) 12.5 MG tablet Take 1 tablet (12.5 mg total) by mouth 2 (two) times daily with a meal. 60 tablet 3   empagliflozin  (JARDIANCE ) 10 MG TABS tablet Take 1 tablet (10 mg total) by mouth daily before breakfast. 30 tablet 11   furosemide  (LASIX ) 40 MG tablet Take 1 tablet (40 mg total) by mouth as needed for fluid or edema. 30 tablet 4   sacubitril -valsartan  (ENTRESTO ) 24-26 MG Take 1 tablet by mouth 2 (two)  times daily. 180 tablet 3   spironolactone  (ALDACTONE ) 25 MG tablet Take 1 tablet (25 mg total) by mouth daily. 30 tablet 3    Labs  Lab Results:  Admission on 06/23/2023  Component Date Value Ref Range Status   WBC 06/23/2023 4.4  4.0 - 10.5 K/uL Final   RBC 06/23/2023 4.93  4.22 - 5.81 MIL/uL Final   Hemoglobin 06/23/2023 14.2  13.0 - 17.0 g/dL Final   HCT 56/21/3086 41.6  39.0 - 52.0 % Final   MCV 06/23/2023 84.4  80.0 - 100.0 fL Final   MCH 06/23/2023 28.8  26.0 - 34.0 pg Final   MCHC 06/23/2023 34.1  30.0 - 36.0 g/dL Final   RDW 57/84/6962 14.2  11.5 - 15.5 % Final   Platelets 06/23/2023 224  150 - 400 K/uL Final   nRBC 06/23/2023 0.0  0.0 - 0.2 % Final   Neutrophils Relative % 06/23/2023 38  % Final   Neutro Abs 06/23/2023 1.7  1.7 - 7.7 K/uL Final   Lymphocytes Relative 06/23/2023 43  % Final   Lymphs Abs 06/23/2023 1.9  0.7 - 4.0 K/uL Final   Monocytes Relative 06/23/2023 12  % Final   Monocytes Absolute 06/23/2023 0.5  0.1 - 1.0 K/uL Final   Eosinophils Relative 06/23/2023 6  % Final   Eosinophils Absolute 06/23/2023 0.3  0.0 - 0.5 K/uL Final   Basophils Relative 06/23/2023 0  % Final   Basophils Absolute 06/23/2023 0.0  0.0 - 0.1 K/uL Final   Immature Granulocytes 06/23/2023 1  % Final   Abs Immature Granulocytes 06/23/2023 0.02  0.00 - 0.07 K/uL Final   Performed at [facility], [address] [place], [place] [address]   Sodium 06/23/2023 139  135 - 145 mmol/L Final   Potassium 06/23/2023 3.7  3.5 - 5.1 mmol/L Final   Chloride 06/23/2023 107  98 - 111 mmol/L Final   CO2 06/23/2023 24  22 - 32 mmol/L Final   Glucose, Bld 06/23/2023 112 (H)  70 - 99 mg/dL Final   Glucose reference range applies only to samples taken after fasting for at least 8 hours.   BUN 06/23/2023 9  6 - 20 mg/dL Final   Creatinine, Ser 06/23/2023 0.88  0.61 - 1.24 mg/dL Final   Calcium  06/23/2023 9.3  8.9 - 10.3 mg/dL Final  Total Protein 06/23/2023 6.8  6.5 - 8.1 g/dL Final   Albumin  47/82/9562 3.6  3.5 - 5.0 g/dL Final   AST 13/12/6576 22  15 - 41 U/L Final   ALT 06/23/2023 16  0 - 44 U/L Final   Alkaline Phosphatase 06/23/2023 54  38 - 126 U/L Final   Total Bilirubin 06/23/2023 0.6  0.0 - 1.2 mg/dL Final   GFR, Estimated 06/23/2023 >60  >60 mL/min Final   Comment: (NOTE) Calculated using the CKD-EPI Creatinine Equation (2021)    Anion gap 06/23/2023 8  5 - 15 Final   Performed at [facility], [address] [place], [place] [address]   Hgb A1c MFr Bld 06/23/2023 5.1  4.8 - 5.6 % Final   Comment: (NOTE) Pre diabetes:          5.7%-6.4%  Diabetes:              >6.4%  Glycemic control for   <7.0% adults with diabetes    Mean Plasma Glucose 06/23/2023 99.67  mg/dL Final   Performed at [facility], [address], [place], [place] [address]   Alcohol, Ethyl (B) 06/23/2023 <10  <10 mg/dL Final   Comment: (NOTE) Lowest detectable limit for serum alcohol is 10 mg/dL.  For medical purposes only. Performed at [facility], [address] [place], [place] [address]    POC Amphetamine UR 06/23/2023 None Detected  NONE DETECTED (Cut Off Level 1000 ng/mL) Final   POC Secobarbital (BAR) 06/23/2023 None Detected  NONE DETECTED (Cut Off Level 300 ng/mL) Final   POC Buprenorphine (BUP) 06/23/2023 None Detected  NONE DETECTED (Cut Off Level 10 ng/mL) Final   POC Oxazepam (BZO) 06/23/2023 None Detected  NONE DETECTED (Cut Off Level 300 ng/mL) Final   POC Cocaine UR 06/23/2023 Positive (A)  NONE DETECTED (Cut Off Level 300 ng/mL) Final   POC Methamphetamine UR 06/23/2023 None Detected  NONE DETECTED (Cut Off Level 1000 ng/mL) Final   POC Morphine 06/23/2023 None Detected  NONE DETECTED (Cut Off Level 300 ng/mL) Final   POC Methadone UR 06/23/2023 None Detected  NONE DETECTED (Cut Off Level 300 ng/mL) Final   POC Oxycodone UR 06/23/2023 None Detected  NONE DETECTED (Cut Off Level 100 ng/mL) Final   POC Marijuana UR 06/23/2023 Positive (A)  NONE DETECTED  (Cut Off Level 50 ng/mL) Final   Cholesterol 06/23/2023 192  0 - 200 mg/dL Final   Triglycerides 05/16/7251 46  <150 mg/dL Final   HDL 66/44/0347 76  >40 mg/dL Final   Total CHOL/HDL Ratio 06/23/2023 2.5  RATIO Final   VLDL 06/23/2023 9  0 - 40 mg/dL Final   LDL Cholesterol 06/23/2023 107 (H)  0 - 99 mg/dL Final   Comment:        Total Cholesterol/HDL:CHD Risk Coronary Heart Disease Risk Table                     Men   Women  1/2 Average Risk   3.4   3.3  Average Risk       5.0   4.4  2 X Average Risk   9.6   7.1  3 X Average Risk  23.4   11.0        Use the calculated Patient Ratio above and the CHD Risk Table to determine the patient's CHD Risk.        ATP III CLASSIFICATION (LDL):  <100  mg/dL   Optimal  098-119  mg/dL   Near or Above                    Optimal  130-159  mg/dL   Borderline  147-829  mg/dL   High  >562     mg/dL   Very High Performed at [facility], [address] [place], [place] [address]    TSH 06/23/2023 1.229  0.350 - 4.500 uIU/mL Final   Comment: Performed by a 3rd Generation assay with a functional sensitivity of <=0.01 uIU/mL. Performed at [facility], [address], [place], [place] [address]   Admission on 06/20/2023, Discharged on 06/20/2023  Component Date Value Ref Range Status   Sodium 06/20/2023 134 (L)  135 - 145 mmol/L Final   Potassium 06/20/2023 3.9  3.5 - 5.1 mmol/L Final   Chloride 06/20/2023 100  98 - 111 mmol/L Final   CO2 06/20/2023 22  22 - 32 mmol/L Final   Glucose, Bld 06/20/2023 77  70 - 99 mg/dL Final   Glucose reference range applies only to samples taken after fasting for at least 8 hours.   BUN 06/20/2023 8  6 - 20 mg/dL Final   Creatinine, Ser 06/20/2023 0.85  0.61 - 1.24 mg/dL Final   Calcium  06/20/2023 9.0  8.9 - 10.3 mg/dL Final   GFR, Estimated 06/20/2023 >60  >60 mL/min Final   Comment: (NOTE) Calculated using the CKD-EPI Creatinine Equation (2021)    Anion gap 06/20/2023 12  5 - 15 Final    Performed at [facility], [address] [place], [place] [address]   WBC 06/20/2023 5.0  4.0 - 10.5 K/uL Final   RBC 06/20/2023 5.19  4.22 - 5.81 MIL/uL Final   Hemoglobin 06/20/2023 15.2  13.0 - 17.0 g/dL Final   HCT 28/41/3244 43.9  39.0 - 52.0 % Final   MCV 06/20/2023 84.6  80.0 - 100.0 fL Final   MCH 06/20/2023 29.3  26.0 - 34.0 pg Final   MCHC 06/20/2023 34.6  30.0 - 36.0 g/dL Final   RDW 05/16/7251 14.1  11.5 - 15.5 % Final   Platelets 06/20/2023 239  150 - 400 K/uL Final   nRBC 06/20/2023 0.0  0.0 - 0.2 % Final   Performed at [facility], [address], [place], [place] [address]   Troponin I (High Sensitivity) 06/20/2023 21 (H)  <18 ng/L Final   Comment: (NOTE) Elevated high sensitivity troponin I (hsTnI) values and significant  changes across serial measurements may suggest ACS but many other  chronic and acute conditions are known to elevate hsTnI results.  Refer to the "Links" section for chest pain algorithms and additional  guidance. Performed at [facility], [address] [place], [place] [address]    B Natriuretic Peptide 06/20/2023 54.9  0.0 - 100.0 pg/mL Final   Performed at [facility], [address], [place], [place] [address]   Troponin I (High Sensitivity) 06/20/2023 19 (H)  <18 ng/L Final   Comment: (NOTE) Elevated high sensitivity troponin I (hsTnI) values and significant  changes across serial measurements may suggest ACS but many other  chronic and acute conditions are known to elevate hsTnI results.  Refer to the "Links" section for chest pain algorithms and additional  guidance. Performed at [facility], [address], [place], [place] [address]   Admission on 06/17/2023, Discharged on 06/18/2023  Component Date Value  Ref Range Status   Sodium 06/17/2023 137  135 - 145 mmol/L Final   Potassium 06/17/2023 3.4 (L)  3.5 - 5.1 mmol/L Final   Chloride 06/17/2023 102  98 - 111 mmol/L Final   CO2 06/17/2023 23  22 -  32 mmol/L Final   Glucose, Bld 06/17/2023 84  70 - 99 mg/dL Final   Glucose reference range applies only to samples taken after fasting for at least 8 hours.   BUN 06/17/2023 5 (L)  6 - 20 mg/dL Final   Creatinine, Ser 06/17/2023 1.01  0.61 - 1.24 mg/dL Final   Calcium  06/17/2023 8.9  8.9 - 10.3 mg/dL Final   GFR, Estimated 06/17/2023 >60  >60 mL/min Final   Comment: (NOTE) Calculated using the CKD-EPI Creatinine Equation (2021)    Anion gap 06/17/2023 12  5 - 15 Final   Performed at [facility], [address] [place], [place] [address]   WBC 06/17/2023 5.0  4.0 - 10.5 K/uL Final   RBC 06/17/2023 4.63  4.22 - 5.81 MIL/uL Final   Hemoglobin 06/17/2023 13.4  13.0 - 17.0 g/dL Final   HCT 19/14/7829 39.7  39.0 - 52.0 % Final   MCV 06/17/2023 85.7  80.0 - 100.0 fL Final   MCH 06/17/2023 28.9  26.0 - 34.0 pg Final   MCHC 06/17/2023 33.8  30.0 - 36.0 g/dL Final   RDW 56/21/3086 14.6  11.5 - 15.5 % Final   Platelets 06/17/2023 234  150 - 400 K/uL Final   nRBC 06/17/2023 0.0  0.0 - 0.2 % Final   Performed at [facility], [address] [place], [place] [address]   Troponin I (High Sensitivity) 06/17/2023 36 (H)  <18 ng/L Final   Comment: (NOTE) Elevated high sensitivity troponin I (hsTnI) values and significant  changes across serial measurements may suggest ACS but many other  chronic and acute conditions are known to elevate hsTnI results.  Refer to the "Links" section for chest pain algorithms and additional  guidance. Performed at [facility], [address] [place], [place] [address]    B Natriuretic Peptide 06/17/2023 185.1 (H)  0.0 - 100.0 pg/mL Final   Performed at [facility], [address] [place], [place] [address]   SARS Coronavirus 2 by RT PCR 06/17/2023 NEGATIVE  NEGATIVE Final   Influenza A by PCR 06/17/2023 NEGATIVE  NEGATIVE Final   Influenza B by PCR 06/17/2023 NEGATIVE  NEGATIVE Final   Comment: (NOTE) The Xpert Xpress  SARS-CoV-2/FLU/RSV plus assay is intended as an aid in the diagnosis of influenza from Nasopharyngeal swab specimens and should not be used as a sole basis for treatment. Nasal washings and aspirates are unacceptable for Xpert Xpress SARS-CoV-2/FLU/RSV testing.  Fact Sheet for Patients: BloggerCourse.com  Fact Sheet for Healthcare Providers: SeriousBroker.it  This test is not yet approved or cleared by the [place]  FDA and has been authorized for detection and/or diagnosis of SARS-CoV-2 by FDA under an Emergency Use Authorization (EUA). This EUA will remain in effect (meaning this test can be used) for the duration of the COVID-19 declaration under Section 564(b)(1) of the Act, 21 U.S.C. section 360bbb-3(b)(1), unless the authorization is terminated or revoked.     Resp Syncytial Virus by PCR 06/17/2023 NEGATIVE  NEGATIVE Final   Comment: (NOTE) Fact Sheet for Patients: BloggerCourse.com  Fact Sheet for Healthcare Providers: SeriousBroker.it  This test is not yet approved or cleared by the [place]  FDA and has  been authorized for detection and/or diagnosis of SARS-CoV-2 by FDA under an Emergency Use Authorization (EUA). This EUA will remain in effect (meaning this test can be used) for the duration of the COVID-19 declaration under Section 564(b)(1) of the Act, 21 U.S.C. section 360bbb-3(b)(1), unless the authorization is terminated or revoked.  Performed at [facility], [address] [place], [place] [address]    Troponin I (High Sensitivity) 06/17/2023 37 (H)  <18 ng/L Final   Comment: (NOTE) Elevated high sensitivity troponin I (hsTnI) values and significant  changes across serial measurements may suggest ACS but many other  chronic and acute conditions are known to elevate hsTnI results.  Refer to the "Links" section for chest pain algorithms and  additional  guidance. Performed at [facility], [address] [place], [place] [address]   Admission on 01/18/2023, Discharged on 01/18/2023  Component Date Value Ref Range Status   Cath EF Quantitative 01/18/2023 25  % Final   Glucose-Capillary 01/18/2023 94  70 - 99 mg/dL Final   Glucose reference range applies only to samples taken after fasting for at least 8 hours.   Comment 1 01/18/2023 Notify RN   Final   Comment 2 01/18/2023 Document in Chart   Final   pH, Ven 01/18/2023 7.355  7.25 - 7.43 Final   pCO2, Ven 01/18/2023 42.9 (L)  44 - 60 mmHg Final   pO2, Ven 01/18/2023 47 (H)  32 - 45 mmHg Final   Bicarbonate 01/18/2023 23.9  20.0 - 28.0 mmol/L Final   TCO2 01/18/2023 25  22 - 32 mmol/L Final   O2 Saturation 01/18/2023 80  % Final   Acid-base deficit 01/18/2023 2.0  0.0 - 2.0 mmol/L Final   Sodium 01/18/2023 143  135 - 145 mmol/L Final   Potassium 01/18/2023 3.3 (L)  3.5 - 5.1 mmol/L Final   Calcium , Ion 01/18/2023 1.13 (L)  1.15 - 1.40 mmol/L Final   HCT 01/18/2023 41.0  39.0 - 52.0 % Final   Hemoglobin 01/18/2023 13.9  13.0 - 17.0 g/dL Final   Sample type 09/81/1914 VENOUS   Final   pH, Ven 01/18/2023 7.350  7.25 - 7.43 Final   pCO2, Ven 01/18/2023 45.2  44 - 60 mmHg Final   pO2, Ven 01/18/2023 44  32 - 45 mmHg Final   Bicarbonate 01/18/2023 24.9  20.0 - 28.0 mmol/L Final   TCO2 01/18/2023 26  22 - 32 mmol/L Final   O2 Saturation 01/18/2023 77  % Final   Acid-base deficit 01/18/2023 1.0  0.0 - 2.0 mmol/L Final   Sodium 01/18/2023 141  135 - 145 mmol/L Final   Potassium 01/18/2023 3.5  3.5 - 5.1 mmol/L Final   Calcium , Ion 01/18/2023 1.20  1.15 - 1.40 mmol/L Final   HCT 01/18/2023 42.0  39.0 - 52.0 % Final   Hemoglobin 01/18/2023 14.3  13.0 - 17.0 g/dL Final   Sample type 78/29/5621 VENOUS   Final   pH, Arterial 01/18/2023 7.357  7.35 - 7.45 Final   pCO2 arterial 01/18/2023 37.3  32 - 48 mmHg Final   pO2, Arterial 01/18/2023 138 (H)  83 - 108 mmHg Final    Bicarbonate 01/18/2023 20.9  20.0 - 28.0 mmol/L Final   TCO2 01/18/2023 22  22 - 32 mmol/L Final   O2 Saturation 01/18/2023 99  % Final   Acid-base deficit 01/18/2023 4.0 (H)  0.0 - 2.0 mmol/L Final   Sodium 01/18/2023 147 (H)  135 - 145 mmol/L Final  Potassium 01/18/2023 2.9 (L)  3.5 - 5.1 mmol/L Final   Calcium , Ion 01/18/2023 0.96 (L)  1.15 - 1.40 mmol/L Final   HCT 01/18/2023 38.0 (L)  39.0 - 52.0 % Final   Hemoglobin 01/18/2023 12.9 (L)  13.0 - 17.0 g/dL Final   Sample type 16/02/9603 ARTERIAL   Final   pH, Ven 01/18/2023 7.347  7.25 - 7.43 Final   pCO2, Ven 01/18/2023 45.2  44 - 60 mmHg Final   pO2, Ven 01/18/2023 48 (H)  32 - 45 mmHg Final   Bicarbonate 01/18/2023 24.8  20.0 - 28.0 mmol/L Final   TCO2 01/18/2023 26  22 - 32 mmol/L Final   O2 Saturation 01/18/2023 81  % Final   Acid-base deficit 01/18/2023 1.0  0.0 - 2.0 mmol/L Final   Sodium 01/18/2023 141  135 - 145 mmol/L Final   Potassium 01/18/2023 3.6  3.5 - 5.1 mmol/L Final   Calcium , Ion 01/18/2023 1.31  1.15 - 1.40 mmol/L Final   HCT 01/18/2023 44.0  39.0 - 52.0 % Final   Hemoglobin 01/18/2023 15.0  13.0 - 17.0 g/dL Final   Sample type 54/01/8118 VENOUS   Final  Hospital Outpatient Visit on 01/02/2023  Component Date Value Ref Range Status   Sodium 01/02/2023 140  135 - 145 mmol/L Final   Potassium 01/02/2023 4.1  3.5 - 5.1 mmol/L Final   Chloride 01/02/2023 98  98 - 111 mmol/L Final   CO2 01/02/2023 28  22 - 32 mmol/L Final   Glucose, Bld 01/02/2023 91  70 - 99 mg/dL Final   Glucose reference range applies only to samples taken after fasting for at least 8 hours.   BUN 01/02/2023 11  6 - 20 mg/dL Final   Creatinine, Ser 01/02/2023 0.87  0.61 - 1.24 mg/dL Final   Calcium  01/02/2023 10.0  8.9 - 10.3 mg/dL Final   GFR, Estimated 01/02/2023 >60  >60 mL/min Final   Comment: (NOTE) Calculated using the CKD-EPI Creatinine Equation (2021)    Anion gap 01/02/2023 14  5 - 15 Final   Performed at [facility] Lab,  [address], [place], [place] [address]   WBC 01/02/2023 6.0  4.0 - 10.5 K/uL Final   RBC 01/02/2023 5.54  4.22 - 5.81 MIL/uL Final   Hemoglobin 01/02/2023 15.8  13.0 - 17.0 g/dL Final   HCT 95/62/1308 45.8  39.0 - 52.0 % Final   MCV 01/02/2023 82.7  80.0 - 100.0 fL Final   MCH 01/02/2023 28.5  26.0 - 34.0 pg Final   MCHC 01/02/2023 34.5  30.0 - 36.0 g/dL Final   RDW 65/78/4696 13.5  11.5 - 15.5 % Final   Platelets 01/02/2023 243  150 - 400 K/uL Final   nRBC 01/02/2023 0.0  0.0 - 0.2 % Final   Performed at [facility], [address] [place], [place] [address]   B Natriuretic Peptide 01/02/2023 62.5  0.0 - 100.0 pg/mL Final   Performed at [facility], [address] [place], [place] [address]  Hospital Outpatient Visit on 01/02/2023  Component Date Value Ref Range Status   S' Lateral 01/02/2023 5.70  cm Final   AV Area VTI 01/02/2023 1.50  cm2 Final   AV Mean grad 01/02/2023 3.5  mmHg Final   Single Plane A4C EF 01/02/2023 31.2  % Final   Single Plane A2C EF 01/02/2023 30.6  % Final   Calc EF 01/02/2023 30.0  % Final   AV Area mean  vel 01/02/2023 1.61  cm2 Final   Area-P 1/2 01/02/2023 15.80  cm2 Final   AR max vel 01/02/2023 1.65  cm2 Final   AV Peak grad 01/02/2023 6.3  mmHg Final   Ao pk vel 01/02/2023 1.26  m/s Final   MV M vel 01/02/2023 1.49  m/s Final   MV Peak grad 01/02/2023 8.8  mmHg Final   P 1/2 time 01/02/2023 1,298  msec Final   Est EF 01/02/2023 25 - 30%   Final    Blood Alcohol level:  Lab Results  Component Value Date   ETH <10 06/23/2023    Metabolic Disorder Labs: Lab Results  Component Value Date   HGBA1C 5.1 06/23/2023   MPG 99.67 06/23/2023   MPG 88.19 06/04/2022   No results found for: "PROLACTIN" Lab Results  Component Value Date   CHOL 192 06/23/2023   TRIG 46 06/23/2023   HDL 76 06/23/2023   CHOLHDL 2.5 06/23/2023   VLDL 9 06/23/2023   LDLCALC 107 (H) 06/23/2023   LDLCALC 104 (H) 06/30/2021     Physical Findings    AUDIT    Flowsheet Row ED to Hosp-Admission (Discharged) from 05/06/2021 in [facility]  Alcohol Use Disorder Identification Test Final Score (AUDIT) 6      CAGE-AID    Flowsheet Row ED to Hosp-Admission (Discharged) from 05/06/2021 in [facility]  CAGE-AID Score 3      GAD-7    Flowsheet Row Office Visit from 05/11/2021 in [facility] [place]  Total GAD-7 Score 1      PHQ2-9    Flowsheet Row ED from 06/23/2023 in [facility] Most recent reading at 06/23/2023  2:43 PM ED from 06/23/2023 in [facility] Most recent reading at 06/23/2023  4:18 AM Office Visit from 05/11/2021 in [facility] [place]. [facility] Most recent reading at 05/11/2021  9:36 AM  PHQ-2 Total Score 6 6 0  PHQ-9 Total Score -- 19 2      Flowsheet Row ED from 06/23/2023 in [facility] Most recent reading at 06/23/2023  5:47 AM ED from 06/23/2023 in [facility] Most recent reading at 06/23/2023  4:18 AM ED from 06/20/2023 in [facility] Emergency Department at [facility] Most recent reading at 06/20/2023 12:10 AM  C-SSRS RISK CATEGORY No Risk Low Risk No Risk        Musculoskeletal  Strength & Muscle Tone: within normal limits Gait & Station: normal Patient leans: N/A  Psychiatric Specialty Exam  Presentation  General Appearance:  Appropriate for Environment; Casual  Eye Contact: Good  Speech: Spontaneous with normal rate and volume  Speech Volume: Normal  Handedness: Right   Mood and Affect  Mood: Fine  Affect: Somewhat constricted   Thought Process  Thought Processes: Coherent; Goal Directed; Linear  Descriptions of Associations:Intact  Orientation:Full (Time, Place and Person)  Thought Content:Logical  Diagnosis of  Schizophrenia or Schizoaffective disorder in past: No    Hallucinations:Hallucinations: None  Ideas of Reference:None  Suicidal Thoughts:Suicidal Thoughts: No  Homicidal Thoughts:Homicidal Thoughts: No   Sensorium  Memory: Remote Good  Judgment: Limited  Insight: Fair   Chartered certified accountant: Fair  Attention Span: Fair  Recall: Good  Fund of Knowledge: Good  Language: Good   Psychomotor Activity  Psychomotor Activity:  Psychomotor Activity: Normal   Assets  Assets: Communication Skills; Resilience; Desire for Improvement   Sleep  Sleep: Sleep: Good     Physical Exam  Physical Exam Constitutional:      Appearance: Normal appearance.  HENT:     Head: Normocephalic and atraumatic.     Nose: No congestion.  Eyes:     Pupils: Pupils are equal, round, and reactive to light.  Cardiovascular:     Rate and Rhythm: Regular rhythm.     Heart sounds: Normal heart sounds.  Pulmonary:     Effort: Pulmonary effort is normal.  Skin:    General: Skin is warm.  Neurological:     General: No focal deficit present.     Mental Status: He is alert and oriented to person, place, and time.    Review of Systems  Constitutional:  Negative for chills and fever.  HENT:  Negative for hearing loss and sore throat.   Eyes:  Negative for blurred vision.  Respiratory:  Negative for cough and shortness of breath.   Cardiovascular:  Negative for chest pain and palpitations.  Gastrointestinal:  Negative for nausea and vomiting.  Neurological:  Positive for speech change. Negative for dizziness.  Psychiatric/Behavioral:  Positive for depression, substance abuse and suicidal ideas.    Blood pressure 102/63, pulse 73, temperature 98.6 F (37 C), temperature source Oral, resp. rate 18, SpO2 100%. There is no height or weight on file to calculate BMI.  Treatment Plan Summary: Daily contact with patient to assess and evaluate symptoms and progress in  treatment and Medication management  Alcohol use disorder Stimulant use disorder-cocaine -Advise cessation - naltrexone  50 mg nightly   Chronic systolic heart failure Hypertension Dyslipidemia -Continue Lipitor 80 mg daily -Continue Coreg  12.5 mg twice daily with meals -Replace Farxiga  with Jardiance  10 mg daily -Continue Lasix  40 mg as needed for fluid/edema -Continue Entresto  1 tablet twice daily -Continue Aldactone  25 mg daily   Disposition: Residential Rehab facility   Silas Drivers, MD 06/24/2023 1:11 PM

## 2023-06-24 NOTE — ED Notes (Signed)
 Patient came to medication window with complaint of needing something for cough. His complaint is that every time he lays down he has to cough. Patient has drank water with no relief. Provider has been notified.

## 2023-06-24 NOTE — Group Note (Signed)
 Group Topic: Change and Accountability  Group Date: 06/24/2023 Start Time: 1000 End Time: 1100 Facilitators: Elfredia Grippe, NT; Rollin Clock  Department: [facility]  Number of Participants: 9  Group Focus: abuse issues Treatment Modality:  Psychoeducation Interventions utilized were problem solving Purpose: enhance coping skills, express feelings, increase insight, and regain self-worth  Name: Joseph Reese Date of Birth: 03/10/1966  MR: [id number]    Level of Participation: moderate Quality of Participation: attentive Interactions with others: gave feedback Mood/Affect: appropriate Triggers (if applicable): N/A Cognition: concrete Progress: Moderate Response: N/A Plan: follow-up needed  Patients Problems:  Patient Active Problem List   Diagnosis Date Noted   Substance abuse (HCC) 06/23/2023   Acute respiratory failure with hypoxia (HCC) 05/06/2021   Acute pulmonary edema (HCC) 05/06/2021   Hypertensive emergency

## 2023-06-24 NOTE — ED Notes (Signed)
 MHT went in the room and tried to wake up pt. Knocked on the door yelled out his name a couple times and he wouldn't get up.

## 2023-06-24 NOTE — ED Notes (Signed)
 Patient is sleeping. Respirations equal and unlabored, skin warm and dry. No change in assessment or acuity. Routine safety checks conducted according to facility protocol. Will continue to monitor for safety.

## 2023-06-24 NOTE — ED Notes (Signed)
 Patient is sitting in the day room watching television with other patients. Patient appears to be in no acute distress, will continue to monitor patient for safety.

## 2023-06-24 NOTE — Progress Notes (Signed)
 Meal given to pt.

## 2023-06-24 NOTE — Discharge Planning (Signed)
 LCSW spoke with patient gather further information regarding disposition plan.  Patient reports he admitted into the hospital due to shortness of breath, cocaine use, and alcohol use.  Patient reports he was referred to the [facility] for further detox.  Patient reports drinking 12 beers daily and using .5 grams of cocaine 3-4 times per week.  Patient reports occasional marijuana use. Patient reports he is currently homeless and has been homeless for the last couple of weeks. Patient reports prior to that he was living with a friend in [place]. Patient denies any legal charges or upcoming court dates.  Patient denies having access to his own transportation. Patient reports he has been to multiple facilities throughout the state of [place] , and reports he has been struggling with substance use since his mid-20s.  Patient reports that he has had periods of sobriety for 3 to 4 years, however reports the last time was back in October where he remained sober for a week. LCSW explored what kept him sober during that time, and patient reports he was happy and was doing well for himself. Patient reports he is interested in residential placement at this time.  Patient has a history of cardiovascular disease with chronic systolic heart failure. Per chart, records reports patient had 3 brothers that died from myocardial fracture at the ages of 25, 26, and 26.  Patient is reported to have 5 children and 3 grandchildren.  Patient reports he knows that substance use affects his health and he wants to make better choices at this time. 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 LCSW will send referrals for placement and will follow up once updates can be provided.   Amey Bal, LCSW Clinical Social Worker [facility] Ph: [phone]

## 2023-06-25 ENCOUNTER — Encounter (HOSPITAL_COMMUNITY): Payer: Self-pay | Admitting: Urology

## 2023-06-25 DIAGNOSIS — F1414 Cocaine abuse with cocaine-induced mood disorder: Secondary | ICD-10-CM | POA: Diagnosis not present

## 2023-06-25 MED ORDER — IBUPROFEN 400 MG PO TABS
400.0000 mg | ORAL_TABLET | Freq: Four times a day (QID) | ORAL | Status: DC | PRN
  Administered 2023-06-26: 400 mg via ORAL
  Filled 2023-06-25: qty 1

## 2023-06-25 MED ORDER — CARVEDILOL 12.5 MG PO TABS
12.5000 mg | ORAL_TABLET | Freq: Two times a day (BID) | ORAL | Status: DC
Start: 2023-06-25 — End: 2023-06-27
  Administered 2023-06-25 – 2023-06-27 (×5): 12.5 mg via ORAL
  Filled 2023-06-25 (×4): qty 1
  Filled 2023-06-25: qty 14
  Filled 2023-06-25: qty 1

## 2023-06-25 NOTE — ED Notes (Signed)
Patient c/o headache

## 2023-06-25 NOTE — ED Notes (Signed)
Pt is currently sleeping, no distress noted, environmental check complete, will continue to monitor patient for safety.

## 2023-06-25 NOTE — Group Note (Signed)
Group Topic: Understanding Self  Group Date: 06/25/2023 Start Time: 1230 End Time: 1300 Facilitators: Prentice Docker, RN  Department: [facility]  Number of Participants: 9  Group Focus: nursing group Treatment Modality:  Psychoeducation Interventions utilized were exploration Purpose: trigger / craving management  Name: Joseph Reese Date of Birth: December 05, 1965  MR: [id number]      Patients Problems:  Patient Active Problem List   Diagnosis Date Noted   Substance abuse (HCC) 06/23/2023   Acute respiratory failure with hypoxia (HCC) 05/06/2021   Acute pulmonary edema (HCC) 05/06/2021   Hypertensive emergency    Level of Participation: active Quality of Participation: cooperative and passive Interactions with others: gave feedback Mood/Affect: appropriate Triggers (if applicable): none identified Cognition: coherent/clear Progress: Gaining insight Response: "I like to read something that makes me happy or look at pictures of my family" Plan: patient will be encouraged to continue utilizing learned coping skills to manage depressive thoughts or cravings"

## 2023-06-25 NOTE — Group Note (Signed)
Group Topic: Social Support  Group Date: 06/25/2023 Start Time: 1930 End Time: 2000 Facilitators: Rae Lips B  Department: [facility]  Number of Participants: 9  Group Focus: check in Treatment Modality:  Individual Therapy Interventions utilized were leisure development Purpose: express feelings  Name: Joseph Reese Date of Birth: February 01, 1966  MR: [id number]    Level of Participation: active Quality of Participation: attentive and cooperative Interactions with others: gave feedback Mood/Affect: appropriate Triggers (if applicable): NA Cognition: coherent/clear Progress: Gaining insight Response: NA Plan: patient will be encouraged to keep going to group  Patients Problems:  Patient Active Problem List   Diagnosis Date Noted   Substance abuse (HCC) 06/23/2023   Acute respiratory failure with hypoxia (HCC) 05/06/2021   Acute pulmonary edema (HCC) 05/06/2021   Hypertensive emergency

## 2023-06-25 NOTE — ED Notes (Signed)
Pt is in the dayroom watching TV with peers. Pt denies SI/HI/AVH. Pt has no further complain.No acute distress noted. Will continue to monitor for safety and provide support.

## 2023-06-25 NOTE — ED Notes (Signed)
Patient A&Ox4. Denies intent to harm self/others when asked. Denies A/VH. Patient denies any physical complaints when asked. No acute distress noted. Support and encouragement provided. Routine safety checks conducted according to facility protocol. Encouraged patient to notify staff if thoughts of harm toward self or others arise. Patient verbalize understanding and agreement. Will continue to monitor for safety.

## 2023-06-25 NOTE — Group Note (Signed)
Group Topic: Communication  Group Date: 06/24/2023 Start Time: 1900 End Time: 1930 Facilitators: Rae Lips B  Department: [facility]  Number of Participants: 2  Group Focus: acceptance Treatment Modality:  Exposure Therapy Interventions utilized were leisure development Purpose: express feelings  Name: Joseph Reese Date of Birth: [birth year]  MR: [id number]    Level of Participation: active Quality of Participation: cooperative Interactions with others: gave feedback Mood/Affect: appropriate Triggers (if applicable): NA Cognition: coherent/clear Progress: Gaining insight Response: NA Plan: patient will be encouraged to keep going to groups.   Patients Problems:  Patient Active Problem List   Diagnosis Date Noted   Substance abuse (HCC) 06/23/2023   Acute respiratory failure with hypoxia (HCC) 05/06/2021   Acute pulmonary edema (HCC) 05/06/2021   Hypertensive emergency

## 2023-06-25 NOTE — ED Provider Notes (Signed)
Behavioral Health Progress Note  Date and Time: 06/25/2023 5:45 PM Name: Joseph Reese MRN:  [id number]  HPI Ladell Lea is a 58 y.o. male with PMHx of stimulant use disorder-cocaine, alcohol use disorder, systolic heart failure, dyslipidemia, and hypertension being admitted to [facility] for detox from alcohol and cocaine as well as seeking residential rehab.  He was admitted to the facility based crisis unit on 06/23/2023  Subjective  Currently on assessment patient is observed sitting in the assessment area.  He is in no acute distress.  He is alert/oriented x 4, cooperative, and attentive.  His speech is clear, coherent, at a normal rate and tone.  He continues to endorse depression with feelings of guilt, worthlessness, and irritability.  He believes he has let his family down.  He has 4 daughters and 1 son.  He has a dysphoric affect.  He is irritable at times throughout the assessment.  He is focused on how he is being treated and he mainly because he is not been given a plate to eat his food on.  He believes that it is a disservice to people to give them their food in a paper box every day.  He is denying any concerns with sleep or his appetite.  He denies any suicidal/homicidal ideations.  He denies ever having any type of suicide attempt.  He denies auditory/visual hallucinations.  He does not appear to be responding to internal/external stimuli.  He endorses a headache as his most recent alcohol withdrawal symptom.  He continues to be interested in a residential substance abuse program and expresses his concern due to homelessness.  He is currently denying any medical concerns.     Diagnosis:  Final diagnoses:  Cocaine abuse with cocaine-induced mood disorder (HCC)  Alcohol use disorder, severe, in controlled environment (HCC)         Sleep: Fair  Appetite:  Fair  Current Medications:  Current Facility-Administered Medications  Medication Dose Route Frequency Provider Last Rate Last  Admin   acetaminophen (TYLENOL) tablet 650 mg  650 mg Oral Q6H PRN Ajibola, Ene A, NP   650 mg at 06/25/23 1244   alum & mag hydroxide-simeth (MAALOX/MYLANTA) 200-200-20 MG/5ML suspension 30 mL  30 mL Oral Q4H PRN Ajibola, Ene A, NP       atorvastatin (LIPITOR) tablet 80 mg  80 mg Oral Daily Ajibola, Ene A, NP   80 mg at 06/25/23 0955   carvedilol (COREG) tablet 12.5 mg  12.5 mg Oral BID WC Onuoha, Chinwendu V, NP   12.5 mg at 06/25/23 1717   empagliflozin (JARDIANCE) tablet 10 mg  10 mg Oral QAC breakfast Ajibola, Ene A, NP   10 mg at 06/25/23 0833   furosemide (LASIX) tablet 40 mg  40 mg Oral PRN Ajibola, Ene A, NP       hydrOXYzine (ATARAX) tablet 25 mg  25 mg Oral Q6H PRN Ajibola, Ene A, NP   25 mg at 06/25/23 1246   ibuprofen (ADVIL) tablet 400 mg  400 mg Oral Q6H PRN Ardis Hughs, NP       lidocaine (LIDODERM) 5 % 1 patch  1 patch Transdermal Q24H Park Pope, MD   1 patch at 06/25/23 1451   loperamide (IMODIUM) capsule 2-4 mg  2-4 mg Oral PRN Ajibola, Ene A, NP       LORazepam (ATIVAN) tablet 1 mg  1 mg Oral Q6H PRN Ajibola, Ene A, NP       magnesium hydroxide (MILK OF MAGNESIA) suspension  30 mL  30 mL Oral Daily PRN Ajibola, Ene A, NP       menthol-cetylpyridinium (CEPACOL) lozenge 3 mg  1 lozenge Oral PRN Onuoha, Chinwendu V, NP   3 mg at 06/25/23 0148   multivitamin with minerals tablet 1 tablet  1 tablet Oral Daily Ajibola, Ene A, NP   1 tablet at 06/25/23 0955   naltrexone (DEPADE) tablet 50 mg  50 mg Oral Seabron Spates, MD   50 mg at 06/24/23 2111   ondansetron (ZOFRAN-ODT) disintegrating tablet 4 mg  4 mg Oral Q6H PRN Ajibola, Ene A, NP       sacubitril-valsartan (ENTRESTO) 24-26 mg per tablet  1 tablet Oral BID Ajibola, Ene A, NP   1 tablet at 06/25/23 0954   spironolactone (ALDACTONE) tablet 25 mg  25 mg Oral Daily Ajibola, Ene A, NP   25 mg at 06/25/23 0955   thiamine (VITAMIN B1) injection 100 mg  100 mg Intramuscular Once Ajibola, Ene A, NP       thiamine (VITAMIN B1)  tablet 100 mg  100 mg Oral Daily Ajibola, Ene A, NP   100 mg at 06/25/23 0955   traZODone (DESYREL) tablet 50 mg  50 mg Oral QHS PRN Ajibola, Ene A, NP   50 mg at 06/24/23 2111   Current Outpatient Medications  Medication Sig Dispense Refill   atorvastatin (LIPITOR) 80 MG tablet Take 1 tablet (80 mg total) by mouth daily. 90 tablet 3   carvedilol (COREG) 12.5 MG tablet Take 1 tablet (12.5 mg total) by mouth 2 (two) times daily with a meal. 60 tablet 3   empagliflozin (JARDIANCE) 10 MG TABS tablet Take 1 tablet (10 mg total) by mouth daily before breakfast. 30 tablet 11   furosemide (LASIX) 40 MG tablet Take 1 tablet (40 mg total) by mouth as needed for fluid or edema. 30 tablet 4   sacubitril-valsartan (ENTRESTO) 24-26 MG Take 1 tablet by mouth 2 (two) times daily. 180 tablet 3   spironolactone (ALDACTONE) 25 MG tablet Take 1 tablet (25 mg total) by mouth daily. 30 tablet 3    Labs  Lab Results:  Admission on 06/23/2023  Component Date Value Ref Range Status   WBC 06/23/2023 4.4  4.0 - 10.5 K/uL Final   RBC 06/23/2023 4.93  4.22 - 5.81 MIL/uL Final   Hemoglobin 06/23/2023 14.2  13.0 - 17.0 g/dL Final   HCT 16/02/9603 41.6  39.0 - 52.0 % Final   MCV 06/23/2023 84.4  80.0 - 100.0 fL Final   MCH 06/23/2023 28.8  26.0 - 34.0 pg Final   MCHC 06/23/2023 34.1  30.0 - 36.0 g/dL Final   RDW 54/01/8118 14.2  11.5 - 15.5 % Final   Platelets 06/23/2023 224  150 - 400 K/uL Final   nRBC 06/23/2023 0.0  0.0 - 0.2 % Final   Neutrophils Relative % 06/23/2023 38  % Final   Neutro Abs 06/23/2023 1.7  1.7 - 7.7 K/uL Final   Lymphocytes Relative 06/23/2023 43  % Final   Lymphs Abs 06/23/2023 1.9  0.7 - 4.0 K/uL Final   Monocytes Relative 06/23/2023 12  % Final   Monocytes Absolute 06/23/2023 0.5  0.1 - 1.0 K/uL Final   Eosinophils Relative 06/23/2023 6  % Final   Eosinophils Absolute 06/23/2023 0.3  0.0 - 0.5 K/uL Final   Basophils Relative 06/23/2023 0  % Final   Basophils Absolute 06/23/2023 0.0   0.0 - 0.1 K/uL Final   Immature Granulocytes  06/23/2023 1  % Final   Abs Immature Granulocytes 06/23/2023 0.02  0.00 - 0.07 K/uL Final   Performed at [facility], [address], [place], [place] [address]   Sodium 06/23/2023 139  135 - 145 mmol/L Final   Potassium 06/23/2023 3.7  3.5 - 5.1 mmol/L Final   Chloride 06/23/2023 107  98 - 111 mmol/L Final   CO2 06/23/2023 24  22 - 32 mmol/L Final   Glucose, Bld 06/23/2023 112 (H)  70 - 99 mg/dL Final   Glucose reference range applies only to samples taken after fasting for at least 8 hours.   BUN 06/23/2023 9  6 - 20 mg/dL Final   Creatinine, Ser 06/23/2023 0.88  0.61 - 1.24 mg/dL Final   Calcium 60/45/4098 9.3  8.9 - 10.3 mg/dL Final   Total Protein 11/91/4782 6.8  6.5 - 8.1 g/dL Final   Albumin 95/62/1308 3.6  3.5 - 5.0 g/dL Final   AST 65/78/4696 22  15 - 41 U/L Final   ALT 06/23/2023 16  0 - 44 U/L Final   Alkaline Phosphatase 06/23/2023 54  38 - 126 U/L Final   Total Bilirubin 06/23/2023 0.6  0.0 - 1.2 mg/dL Final   GFR, Estimated 06/23/2023 >60  >60 mL/min Final   Comment: (NOTE) Calculated using the CKD-EPI Creatinine Equation (2021)    Anion gap 06/23/2023 8  5 - 15 Final   Performed at [facility], [address], [place], [place] [address]   Hgb A1c MFr Bld 06/23/2023 5.1  4.8 - 5.6 % Final   Comment: (NOTE) Pre diabetes:          5.7%-6.4%  Diabetes:              >6.4%  Glycemic control for   <7.0% adults with diabetes    Mean Plasma Glucose 06/23/2023 99.67  mg/dL Final   Performed at [facility], [address], [place], [place] [address]   Alcohol, Ethyl (B) 06/23/2023 <10  <10 mg/dL Final   Comment: (NOTE) Lowest detectable limit for serum alcohol is 10 mg/dL.  For medical purposes only. Performed at [facility], [address], [place], [place] [address]    POC Amphetamine UR 06/23/2023 None Detected  NONE DETECTED (Cut Off Level 1000 ng/mL) Final   POC Secobarbital (BAR) 06/23/2023  None Detected  NONE DETECTED (Cut Off Level 300 ng/mL) Final   POC Buprenorphine (BUP) 06/23/2023 None Detected  NONE DETECTED (Cut Off Level 10 ng/mL) Final   POC Oxazepam (BZO) 06/23/2023 None Detected  NONE DETECTED (Cut Off Level 300 ng/mL) Final   POC Cocaine UR 06/23/2023 Positive (A)  NONE DETECTED (Cut Off Level 300 ng/mL) Final   POC Methamphetamine UR 06/23/2023 None Detected  NONE DETECTED (Cut Off Level 1000 ng/mL) Final   POC Morphine 06/23/2023 None Detected  NONE DETECTED (Cut Off Level 300 ng/mL) Final   POC Methadone UR 06/23/2023 None Detected  NONE DETECTED (Cut Off Level 300 ng/mL) Final   POC Oxycodone UR 06/23/2023 None Detected  NONE DETECTED (Cut Off Level 100 ng/mL) Final   POC Marijuana UR 06/23/2023 Positive (A)  NONE DETECTED (Cut Off Level 50 ng/mL) Final   Cholesterol 06/23/2023 192  0 - 200 mg/dL Final   Triglycerides 72/53/6644 46  <150 mg/dL Final   HDL 03/47/4259 76  >40 mg/dL Final   Total CHOL/HDL Ratio 06/23/2023 2.5  RATIO Final   VLDL 06/23/2023 9  0 -  40 mg/dL Final   LDL Cholesterol 06/23/2023 107 (H)  0 - 99 mg/dL Final   Comment:        Total Cholesterol/HDL:CHD Risk Coronary Heart Disease Risk Table                     Men   Women  1/2 Average Risk   3.4   3.3  Average Risk       5.0   4.4  2 X Average Risk   9.6   7.1  3 X Average Risk  23.4   11.0        Use the calculated Patient Ratio above and the CHD Risk Table to determine the patient's CHD Risk.        ATP III CLASSIFICATION (LDL):  <100     mg/dL   Optimal  161-096  mg/dL   Near or Above                    Optimal  130-159  mg/dL   Borderline  045-409  mg/dL   High  >811     mg/dL   Very High Performed at [facility], [address], [place], [place] [address]    TSH 06/23/2023 1.229  0.350 - 4.500 uIU/mL Final   Comment: Performed by a 3rd Generation assay with a functional sensitivity of <=0.01 uIU/mL. Performed at [facility], [address],  [place], [place] [address]   Admission on 06/20/2023, Discharged on 06/20/2023  Component Date Value Ref Range Status   Sodium 06/20/2023 134 (L)  135 - 145 mmol/L Final   Potassium 06/20/2023 3.9  3.5 - 5.1 mmol/L Final   Chloride 06/20/2023 100  98 - 111 mmol/L Final   CO2 06/20/2023 22  22 - 32 mmol/L Final   Glucose, Bld 06/20/2023 77  70 - 99 mg/dL Final   Glucose reference range applies only to samples taken after fasting for at least 8 hours.   BUN 06/20/2023 8  6 - 20 mg/dL Final   Creatinine, Ser 06/20/2023 0.85  0.61 - 1.24 mg/dL Final   Calcium 13/12/6576 9.0  8.9 - 10.3 mg/dL Final   GFR, Estimated 06/20/2023 >60  >60 mL/min Final   Comment: (NOTE) Calculated using the CKD-EPI Creatinine Equation (2021)    Anion gap 06/20/2023 12  5 - 15 Final   Performed at [facility], [address], [place], [place] [address]   WBC 06/20/2023 5.0  4.0 - 10.5 K/uL Final   RBC 06/20/2023 5.19  4.22 - 5.81 MIL/uL Final   Hemoglobin 06/20/2023 15.2  13.0 - 17.0 g/dL Final   HCT 95/28/4132 43.9  39.0 - 52.0 % Final   MCV 06/20/2023 84.6  80.0 - 100.0 fL Final   MCH 06/20/2023 29.3  26.0 - 34.0 pg Final   MCHC 06/20/2023 34.6  30.0 - 36.0 g/dL Final   RDW 44/05/270 14.1  11.5 - 15.5 % Final   Platelets 06/20/2023 239  150 - 400 K/uL Final   nRBC 06/20/2023 0.0  0.0 - 0.2 % Final   Performed at [facility], [address], [place], [place] [address]   Troponin I (High Sensitivity) 06/20/2023 21 (H)  <18 ng/L Final   Comment: (NOTE) Elevated high sensitivity troponin I (hsTnI) values and significant  changes across serial measurements may suggest ACS but many other  chronic and acute conditions are known to elevate hsTnI results.  Refer to the "Links" section for chest pain algorithms and additional  guidance. Performed at [facility], [address], [place], [place] [address]    B Natriuretic Peptide 06/20/2023 54.9  0.0 - 100.0 pg/mL Final   Performed at [facility], [address] [place], [place] [address]   Troponin I (High Sensitivity) 06/20/2023 19 (H)  <18 ng/L Final   Comment: (NOTE) Elevated high sensitivity troponin I (hsTnI) values and significant  changes across serial measurements may suggest ACS but many other  chronic and acute conditions are known to elevate hsTnI results.  Refer to the "Links" section for chest pain algorithms and additional  guidance. Performed at [facility], [address] [place], [place] [address]   Admission on 06/17/2023, Discharged on 06/18/2023  Component Date Value Ref Range Status   Sodium 06/17/2023 137  135 - 145 mmol/L Final   Potassium 06/17/2023 3.4 (L)  3.5 - 5.1 mmol/L Final   Chloride 06/17/2023 102  98 - 111 mmol/L Final   CO2 06/17/2023 23  22 - 32 mmol/L Final   Glucose, Bld 06/17/2023 84  70 - 99 mg/dL Final   Glucose reference range applies only to samples taken after fasting for at least 8 hours.   BUN 06/17/2023 5 (L)  6 - 20 mg/dL Final   Creatinine, Ser 06/17/2023 1.01  0.61 - 1.24 mg/dL Final   Calcium 91/47/8295 8.9  8.9 - 10.3 mg/dL Final   GFR, Estimated 06/17/2023 >60  >60 mL/min Final   Comment: (NOTE) Calculated using the CKD-EPI Creatinine Equation (2021)    Anion gap 06/17/2023 12  5 - 15 Final   Performed at [facility], [address] [place], [place] [address]   WBC 06/17/2023 5.0  4.0 - 10.5 K/uL Final   RBC 06/17/2023 4.63  4.22 - 5.81 MIL/uL Final   Hemoglobin 06/17/2023 13.4  13.0 - 17.0 g/dL Final   HCT 86/57/8469 39.7  39.0 - 52.0 % Final   MCV 06/17/2023 85.7  80.0 - 100.0 fL Final   MCH 06/17/2023 28.9  26.0 - 34.0 pg Final   MCHC 06/17/2023 33.8  30.0 - 36.0 g/dL Final   RDW 62/95/2841 14.6  11.5 - 15.5 % Final   Platelets 06/17/2023 234  150 - 400 K/uL Final   nRBC 06/17/2023 0.0  0.0 - 0.2 % Final   Performed at [facility], [address], [place], [place] [address]   Troponin I (High Sensitivity) 06/17/2023 36 (H)  <18 ng/L  Final   Comment: (NOTE) Elevated high sensitivity troponin I (hsTnI) values and significant  changes across serial measurements may suggest ACS but many other  chronic and acute conditions are known to elevate hsTnI results.  Refer to the "Links" section for chest pain algorithms and additional  guidance. Performed at [facility], [address] [place], [place] [address]    B Natriuretic Peptide 06/17/2023 185.1 (H)  0.0 - 100.0 pg/mL Final   Performed at [facility], [address], [place], [place] [address]   SARS Coronavirus 2 by RT PCR 06/17/2023 NEGATIVE  NEGATIVE Final   Influenza A by PCR 06/17/2023 NEGATIVE  NEGATIVE Final   Influenza B by PCR 06/17/2023 NEGATIVE  NEGATIVE Final   Comment: (NOTE) The Xpert Xpress SARS-CoV-2/FLU/RSV plus assay is intended as an aid in the diagnosis of influenza from Nasopharyngeal swab specimens and should not be used as a  sole basis for treatment. Nasal washings and aspirates are unacceptable for Xpert Xpress SARS-CoV-2/FLU/RSV testing.  Fact Sheet for Patients: BloggerCourse.com  Fact Sheet for Healthcare Providers: SeriousBroker.it  This test is not yet approved or cleared by the [place] FDA and has been authorized for detection and/or diagnosis of SARS-CoV-2 by FDA under an Emergency Use Authorization (EUA). This EUA will remain in effect (meaning this test can be used) for the duration of the COVID-19 declaration under Section 564(b)(1) of the Act, 21 U.S.C. section 360bbb-3(b)(1), unless the authorization is terminated or revoked.     Resp Syncytial Virus by PCR 06/17/2023 NEGATIVE  NEGATIVE Final   Comment: (NOTE) Fact Sheet for Patients: BloggerCourse.com  Fact Sheet for Healthcare Providers: SeriousBroker.it  This test is not yet approved or cleared by the [place] FDA and has been authorized for  detection and/or diagnosis of SARS-CoV-2 by FDA under an Emergency Use Authorization (EUA). This EUA will remain in effect (meaning this test can be used) for the duration of the COVID-19 declaration under Section 564(b)(1) of the Act, 21 U.S.C. section 360bbb-3(b)(1), unless the authorization is terminated or revoked.  Performed at [facility], [address] [place], [place] [address]    Troponin I (High Sensitivity) 06/17/2023 37 (H)  <18 ng/L Final   Comment: (NOTE) Elevated high sensitivity troponin I (hsTnI) values and significant  changes across serial measurements may suggest ACS but many other  chronic and acute conditions are known to elevate hsTnI results.  Refer to the "Links" section for chest pain algorithms and additional  guidance. Performed at [facility], [address], [place], [place] [address]   Admission on 01/18/2023, Discharged on 01/18/2023  Component Date Value Ref Range Status   Cath EF Quantitative 01/18/2023 25  % Final   Glucose-Capillary 01/18/2023 94  70 - 99 mg/dL Final   Glucose reference range applies only to samples taken after fasting for at least 8 hours.   Comment 1 01/18/2023 Notify RN   Final   Comment 2 01/18/2023 Document in Chart   Final   pH, Ven 01/18/2023 7.355  7.25 - 7.43 Final   pCO2, Ven 01/18/2023 42.9 (L)  44 - 60 mmHg Final   pO2, Ven 01/18/2023 47 (H)  32 - 45 mmHg Final   Bicarbonate 01/18/2023 23.9  20.0 - 28.0 mmol/L Final   TCO2 01/18/2023 25  22 - 32 mmol/L Final   O2 Saturation 01/18/2023 80  % Final   Acid-base deficit 01/18/2023 2.0  0.0 - 2.0 mmol/L Final   Sodium 01/18/2023 143  135 - 145 mmol/L Final   Potassium 01/18/2023 3.3 (L)  3.5 - 5.1 mmol/L Final   Calcium, Ion 01/18/2023 1.13 (L)  1.15 - 1.40 mmol/L Final   HCT 01/18/2023 41.0  39.0 - 52.0 % Final   Hemoglobin 01/18/2023 13.9  13.0 - 17.0 g/dL Final   Sample type 09/81/1914 VENOUS   Final   pH, Ven 01/18/2023 7.350  7.25 - 7.43 Final    pCO2, Ven 01/18/2023 45.2  44 - 60 mmHg Final   pO2, Ven 01/18/2023 44  32 - 45 mmHg Final   Bicarbonate 01/18/2023 24.9  20.0 - 28.0 mmol/L Final   TCO2 01/18/2023 26  22 - 32 mmol/L Final   O2 Saturation 01/18/2023 77  % Final   Acid-base deficit 01/18/2023 1.0  0.0 - 2.0 mmol/L Final   Sodium 01/18/2023 141  135 - 145 mmol/L Final   Potassium 01/18/2023  3.5  3.5 - 5.1 mmol/L Final   Calcium, Ion 01/18/2023 1.20  1.15 - 1.40 mmol/L Final   HCT 01/18/2023 42.0  39.0 - 52.0 % Final   Hemoglobin 01/18/2023 14.3  13.0 - 17.0 g/dL Final   Sample type 09/60/4540 VENOUS   Final   pH, Arterial 01/18/2023 7.357  7.35 - 7.45 Final   pCO2 arterial 01/18/2023 37.3  32 - 48 mmHg Final   pO2, Arterial 01/18/2023 138 (H)  83 - 108 mmHg Final   Bicarbonate 01/18/2023 20.9  20.0 - 28.0 mmol/L Final   TCO2 01/18/2023 22  22 - 32 mmol/L Final   O2 Saturation 01/18/2023 99  % Final   Acid-base deficit 01/18/2023 4.0 (H)  0.0 - 2.0 mmol/L Final   Sodium 01/18/2023 147 (H)  135 - 145 mmol/L Final   Potassium 01/18/2023 2.9 (L)  3.5 - 5.1 mmol/L Final   Calcium, Ion 01/18/2023 0.96 (L)  1.15 - 1.40 mmol/L Final   HCT 01/18/2023 38.0 (L)  39.0 - 52.0 % Final   Hemoglobin 01/18/2023 12.9 (L)  13.0 - 17.0 g/dL Final   Sample type 98/03/9146 ARTERIAL   Final   pH, Ven 01/18/2023 7.347  7.25 - 7.43 Final   pCO2, Ven 01/18/2023 45.2  44 - 60 mmHg Final   pO2, Ven 01/18/2023 48 (H)  32 - 45 mmHg Final   Bicarbonate 01/18/2023 24.8  20.0 - 28.0 mmol/L Final   TCO2 01/18/2023 26  22 - 32 mmol/L Final   O2 Saturation 01/18/2023 81  % Final   Acid-base deficit 01/18/2023 1.0  0.0 - 2.0 mmol/L Final   Sodium 01/18/2023 141  135 - 145 mmol/L Final   Potassium 01/18/2023 3.6  3.5 - 5.1 mmol/L Final   Calcium, Ion 01/18/2023 1.31  1.15 - 1.40 mmol/L Final   HCT 01/18/2023 44.0  39.0 - 52.0 % Final   Hemoglobin 01/18/2023 15.0  13.0 - 17.0 g/dL Final   Sample type 82/95/6213 VENOUS   Final  Hospital Outpatient  Visit on 01/02/2023  Component Date Value Ref Range Status   Sodium 01/02/2023 140  135 - 145 mmol/L Final   Potassium 01/02/2023 4.1  3.5 - 5.1 mmol/L Final   Chloride 01/02/2023 98  98 - 111 mmol/L Final   CO2 01/02/2023 28  22 - 32 mmol/L Final   Glucose, Bld 01/02/2023 91  70 - 99 mg/dL Final   Glucose reference range applies only to samples taken after fasting for at least 8 hours.   BUN 01/02/2023 11  6 - 20 mg/dL Final   Creatinine, Ser 01/02/2023 0.87  0.61 - 1.24 mg/dL Final   Calcium 08/65/7846 10.0  8.9 - 10.3 mg/dL Final   GFR, Estimated 01/02/2023 >60  >60 mL/min Final   Comment: (NOTE) Calculated using the CKD-EPI Creatinine Equation (2021)    Anion gap 01/02/2023 14  5 - 15 Final   Performed at [facility] Lab, [address] [place], [place] [address]   WBC 01/02/2023 6.0  4.0 - 10.5 K/uL Final   RBC 01/02/2023 5.54  4.22 - 5.81 MIL/uL Final   Hemoglobin 01/02/2023 15.8  13.0 - 17.0 g/dL Final   HCT 28/41/3244 45.8  39.0 - 52.0 % Final   MCV 01/02/2023 82.7  80.0 - 100.0 fL Final   MCH 01/02/2023 28.5  26.0 - 34.0 pg Final   MCHC 01/02/2023 34.5  30.0 - 36.0 g/dL Final   RDW 05/16/7251 13.5  11.5 - 15.5 %  Final   Platelets 01/02/2023 243  150 - 400 K/uL Final   nRBC 01/02/2023 0.0  0.0 - 0.2 % Final   Performed at [facility], [address], [place], [place] [address]   B Natriuretic Peptide 01/02/2023 62.5  0.0 - 100.0 pg/mL Final   Performed at [facility], [address], [place], [place] [address]  Hospital Outpatient Visit on 01/02/2023  Component Date Value Ref Range Status   S' Lateral 01/02/2023 5.70  cm Final   AV Area VTI 01/02/2023 1.50  cm2 Final   AV Mean grad 01/02/2023 3.5  mmHg Final   Single Plane A4C EF 01/02/2023 31.2  % Final   Single Plane A2C EF 01/02/2023 30.6  % Final   Calc EF 01/02/2023 30.0  % Final   AV Area mean vel 01/02/2023 1.61  cm2 Final   Area-P 1/2 01/02/2023 15.80  cm2 Final   AR max vel 01/02/2023 1.65   cm2 Final   AV Peak grad 01/02/2023 6.3  mmHg Final   Ao pk vel 01/02/2023 1.26  m/s Final   MV M vel 01/02/2023 1.49  m/s Final   MV Peak grad 01/02/2023 8.8  mmHg Final   P 1/2 time 01/02/2023 1,298  msec Final   Est EF 01/02/2023 25 - 30%   Final    Blood Alcohol level:  Lab Results  Component Value Date   ETH <10 06/23/2023    Metabolic Disorder Labs: Lab Results  Component Value Date   HGBA1C 5.1 06/23/2023   MPG 99.67 06/23/2023   MPG 88.19 06/04/2022   No results found for: "PROLACTIN" Lab Results  Component Value Date   CHOL 192 06/23/2023   TRIG 46 06/23/2023   HDL 76 06/23/2023   CHOLHDL 2.5 06/23/2023   VLDL 9 06/23/2023   LDLCALC 107 (H) 06/23/2023   LDLCALC 104 (H) 06/30/2021     Physical Findings   AUDIT    Flowsheet Row ED to Hosp-Admission (Discharged) from 05/06/2021 in [place] [facility]  Alcohol Use Disorder Identification Test Final Score (AUDIT) 6      CAGE-AID    Flowsheet Row ED to Hosp-Admission (Discharged) from 05/06/2021 in [place] [facility]  CAGE-AID Score 3      GAD-7    Flowsheet Row Office Visit from 05/11/2021 in [facility] [place]  Total GAD-7 Score 1      PHQ2-9    Flowsheet Row ED from 06/23/2023 in [facility] Most recent reading at 06/23/2023  2:43 PM ED from 06/23/2023 in [facility] Most recent reading at 06/23/2023  4:18 AM Office Visit from 05/11/2021 in [facility] - A Dept Of [place]. [facility] Most recent reading at 05/11/2021  9:36 AM  PHQ-2 Total Score 6 6 0  PHQ-9 Total Score -- 19 2      Flowsheet Row ED from 06/23/2023 in [facility] Most recent reading at 06/23/2023  5:47 AM ED from 06/23/2023 in [facility] Most recent reading at 06/23/2023  4:18 AM ED from 06/20/2023 in [facility]  Emergency Department at [facility] Most recent reading at 06/20/2023 12:10 AM  C-SSRS RISK CATEGORY No Risk Low Risk No Risk        Musculoskeletal  Strength & Muscle Tone: within normal limits Gait & Station:  normal Patient leans: N/A  Psychiatric Specialty Exam  Presentation  General Appearance:  Appropriate for Environment; Casual  Eye Contact: Good  Speech: Spontaneous with normal rate and volume  Speech Volume: Normal  Handedness: Right   Mood and Affect  Mood: Fine  Affect: Somewhat constricted   Thought Process  Thought Processes: Coherent  Descriptions of Associations:Intact  Orientation:Full (Time, Place and Person)  Thought Content:Logical  Diagnosis of Schizophrenia or Schizoaffective disorder in past: No    Hallucinations:Hallucinations: None  Ideas of Reference:None  Suicidal Thoughts:Suicidal Thoughts: No  Homicidal Thoughts:Homicidal Thoughts: No   Sensorium  Memory: Immediate Good; Recent Good; Remote Good  Judgment: Limited  Insight: Good   Executive Functions  Concentration: Good  Attention Span: Good  Recall: Good  Fund of Knowledge: Good  Language: Good   Psychomotor Activity  Psychomotor Activity: Psychomotor Activity: Normal   Assets  Assets: Physical Health; Desire for Improvement; Communication Skills; Resilience   Sleep  Sleep: Sleep: Good     Physical Exam  Physical Exam Constitutional:      Appearance: Normal appearance.  HENT:     Head: Normocephalic and atraumatic.     Nose: No congestion.  Eyes:     Pupils: Pupils are equal, round, and reactive to light.  Cardiovascular:     Rate and Rhythm: Regular rhythm.     Heart sounds: Normal heart sounds.  Pulmonary:     Effort: Pulmonary effort is normal.  Skin:    General: Skin is warm.  Neurological:     General: No focal deficit present.     Mental Status: He is alert and oriented to person, place, and time.     Review of Systems  Constitutional:  Negative for chills and fever.  HENT:  Negative for hearing loss and sore throat.   Eyes:  Negative for blurred vision.  Respiratory:  Negative for cough and shortness of breath.   Cardiovascular:  Negative for chest pain and palpitations.  Gastrointestinal:  Negative for nausea and vomiting.  Neurological:  Negative for dizziness and speech change.  Psychiatric/Behavioral:  Positive for substance abuse. Negative for depression and suicidal ideas.    Blood pressure 136/81, pulse 77, temperature 98.4 F (36.9 C), temperature source Oral, resp. rate 18, SpO2 100%. There is no height or weight on file to calculate BMI.  Treatment Plan Summary: Daily contact with patient to assess and evaluate symptoms and progress in treatment and Medication management  Alcohol use disorder Stimulant use disorder-cocaine -Advise cessation -continue CIWA -Continue Naltrexone 50 mg QHS  Chronic systolic heart failure Hypertension Dyslipidemia -Continue Lipitor 80 mg daily -Continue Coreg 12.5 mg twice daily with meals -Replace Farxiga with Jardiance 10 mg daily -Continue Lasix 40 mg as needed for fluid/edema -Continue Entresto 1 tablet twice daily -Continue Aldactone 25 mg daily   Disposition: Social work continues to seek placement in residential Rehab facility   Ardis Hughs, NP 06/25/2023 5:45 PM

## 2023-06-25 NOTE — Group Note (Signed)
Group Topic: Change and Accountability  Group Date: 06/25/2023 Start Time: 1630 End Time: 1700 Facilitators: Loyce Dys, NT; Elenor Quinones, [place]; Cristal Ford  Department: [facility]  Number of Participants: 8  Group Focus: acceptance Treatment Modality:  Psychoeducation Interventions utilized were patient education Purpose: express feelings  Name: Joseph Reese Date of Birth: 02-Feb-1966  MR: [id number]    Level of Participation: active Quality of Participation: cooperative Interactions with others: gave feedback Mood/Affect: appropriate Triggers (if applicable): N/A Cognition: coherent/clear Progress: Moderate Response: PT stated he would tell his younger self to chill out (on drugs) and do better Plan: follow-up needed  Patients Problems:  Patient Active Problem List   Diagnosis Date Noted   Substance abuse (HCC) 06/23/2023   Acute respiratory failure with hypoxia (HCC) 05/06/2021   Acute pulmonary edema (HCC) 05/06/2021   Hypertensive emergency

## 2023-06-25 NOTE — ED Notes (Signed)
Pt sitting in dayroom watching television and interacting with peers. No acute distress noted. No concerns voiced. Informed pt to notify staff with any needs or assistance. Pt verbalized understanding and agreement. Will continue to monitor for safety.

## 2023-06-25 NOTE — Progress Notes (Signed)
Meal given to pt.

## 2023-06-25 NOTE — Discharge Planning (Signed)
Patient has been referred to the following facilities for review:   [facility]: Patient advised to call in to complete phone screening; Hefty co-pay mentioned to this provider [facility]  LCSW will also provide the patient with a list of [address] and [facility] contact information for further follow up.   Fernande Boyden, LCSW Clinical Social Worker [facility] Ph: [phone]

## 2023-06-25 NOTE — ED Notes (Addendum)
Pt sitting in hallway interacting with another peer. No acute distress noted. No concerns voiced. Informed pt to notify staff with any needs or assistance. Pt verbalized understanding and agreement. Will continue to monitor for safety.

## 2023-06-25 NOTE — ED Notes (Signed)
Patient is sleeping. Respirations equal and unlabored, skin warm and dry. No change in assessment or acuity. Routine safety checks conducted according to facility protocol. Will continue to monitor for safety.

## 2023-06-26 DIAGNOSIS — F1414 Cocaine abuse with cocaine-induced mood disorder: Secondary | ICD-10-CM | POA: Diagnosis not present

## 2023-06-26 MED ORDER — NALTREXONE HCL 50 MG PO TABS: 50.0000 mg | ORAL_TABLET | Freq: Every day | ORAL | 0 refills | Status: DC

## 2023-06-26 MED ORDER — LIDOCAINE 5 % EX PTCH: 1.0000 | MEDICATED_PATCH | CUTANEOUS | 0 refills | Status: DC

## 2023-06-26 MED ORDER — HYDROXYZINE HCL 25 MG PO TABS: 25.0000 mg | ORAL_TABLET | Freq: Four times a day (QID) | ORAL | 0 refills | Status: DC | PRN

## 2023-06-26 MED ORDER — TRAZODONE HCL 50 MG PO TABS: 50.0000 mg | ORAL_TABLET | Freq: Every evening | ORAL | 0 refills | Status: DC | PRN

## 2023-06-26 MED ORDER — VITAMIN B-1 100 MG PO TABS: 100.0000 mg | ORAL_TABLET | Freq: Every day | ORAL | 0 refills | Status: DC

## 2023-06-26 NOTE — ED Provider Notes (Signed)
Behavioral Health Progress Note  Date and Time: 06/26/2023 3:38 PM Name: Joseph Reese MRN:  [id number]  HPI Joseph Reese is a 58 y.o. male with PMHx of stimulant use disorder-cocaine, alcohol use disorder, systolic heart failure, dyslipidemia, and hypertension being admitted to [facility] for detox from alcohol and cocaine as well as seeking residential rehab.  He was admitted to the facility based crisis unit on 06/23/2023  Subjective  The patient was observed sitting in the day room, watching television and interacting with other patients. When asked to return to his room for assessment, he responded politely and complied without hesitation. His gait was steady while ambulating.  The patient reports feeling much better since admission, noting an improvement in mood. He endorses sleeping 7-8 hours per night and states that his appetite has also increased. He attributes these improvements to abstaining from alcohol. He expresses a strong desire to "get his life together and improve his mental and physical health."    PHQ9= 3 (somewhat difficult)   He denies any withdrawal symptoms at this time but reports waking up with a headache this morning. The patient states that during previous visits, his withdrawal symptoms included sweating, nausea, vomiting, abdominal pain, and tremors.   Joseph Reese appears in no acute distress. He is alert, oriented 4, calm, cooperative, and attentive. His mood is euthymic with a congruent affect. His speech is normal, and his behavior is appropriate. His thought process is logical, goal-directed, and coherent, with no evidence of distractibility or preoccupation. There are no signs of psychosis, mania, or delusional thinking. He denies suicidal or homicidal ideation, self-harm, psychosis, and paranoia. His insight and judgment are appropriate. Overall, he appears stable and motivated for treatment.     His primary goal is to enter inpatient rehabilitation. The patient states  that he was provided with contact information for [facility]. He attempted to call yesterday but did not receive a response; he plans to call again today. He was encouraged to follow up, and he was observed using the telephone after the assessment.   He was also encouraged to participate in group sessions to maximize the benefits of his time at [facility].   Diagnosis:  Final diagnoses:  Cocaine abuse with cocaine-induced mood disorder (HCC)  Alcohol use disorder, severe, in controlled environment (HCC)         Sleep: Fair  Appetite:  Fair  Current Medications:  Current Facility-Administered Medications  Medication Dose Route Frequency Provider Last Rate Last Admin   acetaminophen (TYLENOL) tablet 650 mg  650 mg Oral Q6H PRN Ajibola, Ene A, NP   650 mg at 06/25/23 1244   alum & mag hydroxide-simeth (MAALOX/MYLANTA) 200-200-20 MG/5ML suspension 30 mL  30 mL Oral Q4H PRN Ajibola, Ene A, NP       atorvastatin (LIPITOR) tablet 80 mg  80 mg Oral Daily Ajibola, Ene A, NP   80 mg at 06/26/23 0911   carvedilol (COREG) tablet 12.5 mg  12.5 mg Oral BID WC Onuoha, Chinwendu V, NP   12.5 mg at 06/26/23 0900   empagliflozin (JARDIANCE) tablet 10 mg  10 mg Oral QAC breakfast Ajibola, Ene A, NP   10 mg at 06/26/23 0900   furosemide (LASIX) tablet 40 mg  40 mg Oral PRN Ajibola, Ene A, NP       ibuprofen (ADVIL) tablet 400 mg  400 mg Oral Q6H PRN Ardis Hughs, NP   400 mg at 06/26/23 0315   lidocaine (LIDODERM) 5 % 1 patch  1 patch Transdermal Q24H Park Pope, MD   1 patch at 06/25/23 1451   magnesium hydroxide (MILK OF MAGNESIA) suspension 30 mL  30 mL Oral Daily PRN Ajibola, Ene A, NP       menthol-cetylpyridinium (CEPACOL) lozenge 3 mg  1 lozenge Oral PRN Onuoha, Chinwendu V, NP   3 mg at 06/25/23 0148   multivitamin with minerals tablet 1 tablet  1 tablet Oral Daily Ajibola, Ene A, NP   1 tablet at 06/26/23 0911   naltrexone (DEPADE) tablet 50 mg  50 mg Oral Seabron Spates, MD   50  mg at 06/25/23 2107   sacubitril-valsartan (ENTRESTO) 24-26 mg per tablet  1 tablet Oral BID Ajibola, Ene A, NP   1 tablet at 06/26/23 1610   spironolactone (ALDACTONE) tablet 25 mg  25 mg Oral Daily Ajibola, Ene A, NP   25 mg at 06/26/23 9604   thiamine (VITAMIN B1) injection 100 mg  100 mg Intramuscular Once Ajibola, Ene A, NP       thiamine (VITAMIN B1) tablet 100 mg  100 mg Oral Daily Ajibola, Ene A, NP   100 mg at 06/26/23 0911   traZODone (DESYREL) tablet 50 mg  50 mg Oral QHS PRN Ajibola, Ene A, NP   50 mg at 06/25/23 2107   Current Outpatient Medications  Medication Sig Dispense Refill   atorvastatin (LIPITOR) 80 MG tablet Take 1 tablet (80 mg total) by mouth daily. 90 tablet 3   carvedilol (COREG) 12.5 MG tablet Take 1 tablet (12.5 mg total) by mouth 2 (two) times daily with a meal. 60 tablet 3   empagliflozin (JARDIANCE) 10 MG TABS tablet Take 1 tablet (10 mg total) by mouth daily before breakfast. 30 tablet 11   furosemide (LASIX) 40 MG tablet Take 1 tablet (40 mg total) by mouth as needed for fluid or edema. 30 tablet 4   sacubitril-valsartan (ENTRESTO) 24-26 MG Take 1 tablet by mouth 2 (two) times daily. 180 tablet 3   spironolactone (ALDACTONE) 25 MG tablet Take 1 tablet (25 mg total) by mouth daily. 30 tablet 3    Labs  Lab Results:  Admission on 06/23/2023  Component Date Value Ref Range Status   WBC 06/23/2023 4.4  4.0 - 10.5 K/uL Final   RBC 06/23/2023 4.93  4.22 - 5.81 MIL/uL Final   Hemoglobin 06/23/2023 14.2  13.0 - 17.0 g/dL Final   HCT 54/01/8118 41.6  39.0 - 52.0 % Final   MCV 06/23/2023 84.4  80.0 - 100.0 fL Final   MCH 06/23/2023 28.8  26.0 - 34.0 pg Final   MCHC 06/23/2023 34.1  30.0 - 36.0 g/dL Final   RDW 14/78/2956 14.2  11.5 - 15.5 % Final   Platelets 06/23/2023 224  150 - 400 K/uL Final   nRBC 06/23/2023 0.0  0.0 - 0.2 % Final   Neutrophils Relative % 06/23/2023 38  % Final   Neutro Abs 06/23/2023 1.7  1.7 - 7.7 K/uL Final   Lymphocytes Relative  06/23/2023 43  % Final   Lymphs Abs 06/23/2023 1.9  0.7 - 4.0 K/uL Final   Monocytes Relative 06/23/2023 12  % Final   Monocytes Absolute 06/23/2023 0.5  0.1 - 1.0 K/uL Final   Eosinophils Relative 06/23/2023 6  % Final   Eosinophils Absolute 06/23/2023 0.3  0.0 - 0.5 K/uL Final   Basophils Relative 06/23/2023 0  % Final   Basophils Absolute 06/23/2023 0.0  0.0 - 0.1 K/uL Final   Immature Granulocytes  06/23/2023 1  % Final   Abs Immature Granulocytes 06/23/2023 0.02  0.00 - 0.07 K/uL Final   Performed at [facility], [address] [place], [place] [address]   Sodium 06/23/2023 139  135 - 145 mmol/L Final   Potassium 06/23/2023 3.7  3.5 - 5.1 mmol/L Final   Chloride 06/23/2023 107  98 - 111 mmol/L Final   CO2 06/23/2023 24  22 - 32 mmol/L Final   Glucose, Bld 06/23/2023 112 (H)  70 - 99 mg/dL Final   Glucose reference range applies only to samples taken after fasting for at least 8 hours.   BUN 06/23/2023 9  6 - 20 mg/dL Final   Creatinine, Ser 06/23/2023 0.88  0.61 - 1.24 mg/dL Final   Calcium 60/45/4098 9.3  8.9 - 10.3 mg/dL Final   Total Protein 11/91/4782 6.8  6.5 - 8.1 g/dL Final   Albumin 95/62/1308 3.6  3.5 - 5.0 g/dL Final   AST 65/78/4696 22  15 - 41 U/L Final   ALT 06/23/2023 16  0 - 44 U/L Final   Alkaline Phosphatase 06/23/2023 54  38 - 126 U/L Final   Total Bilirubin 06/23/2023 0.6  0.0 - 1.2 mg/dL Final   GFR, Estimated 06/23/2023 >60  >60 mL/min Final   Comment: (NOTE) Calculated using the CKD-EPI Creatinine Equation (2021)    Anion gap 06/23/2023 8  5 - 15 Final   Performed at [facility], [address] [place], [place] [address]   Hgb A1c MFr Bld 06/23/2023 5.1  4.8 - 5.6 % Final   Comment: (NOTE) Pre diabetes:          5.7%-6.4%  Diabetes:              >6.4%  Glycemic control for   <7.0% adults with diabetes    Mean Plasma Glucose 06/23/2023 99.67  mg/dL Final   Performed at [facility], [address], [place], [place] [address]    Alcohol, Ethyl (B) 06/23/2023 <10  <10 mg/dL Final   Comment: (NOTE) Lowest detectable limit for serum alcohol is 10 mg/dL.  For medical purposes only. Performed at [facility], [address] [place], [place] [address]    POC Amphetamine UR 06/23/2023 None Detected  NONE DETECTED (Cut Off Level 1000 ng/mL) Final   POC Secobarbital (BAR) 06/23/2023 None Detected  NONE DETECTED (Cut Off Level 300 ng/mL) Final   POC Buprenorphine (BUP) 06/23/2023 None Detected  NONE DETECTED (Cut Off Level 10 ng/mL) Final   POC Oxazepam (BZO) 06/23/2023 None Detected  NONE DETECTED (Cut Off Level 300 ng/mL) Final   POC Cocaine UR 06/23/2023 Positive (A)  NONE DETECTED (Cut Off Level 300 ng/mL) Final   POC Methamphetamine UR 06/23/2023 None Detected  NONE DETECTED (Cut Off Level 1000 ng/mL) Final   POC Morphine 06/23/2023 None Detected  NONE DETECTED (Cut Off Level 300 ng/mL) Final   POC Methadone UR 06/23/2023 None Detected  NONE DETECTED (Cut Off Level 300 ng/mL) Final   POC Oxycodone UR 06/23/2023 None Detected  NONE DETECTED (Cut Off Level 100 ng/mL) Final   POC Marijuana UR 06/23/2023 Positive (A)  NONE DETECTED (Cut Off Level 50 ng/mL) Final   Cholesterol 06/23/2023 192  0 - 200 mg/dL Final   Triglycerides 72/53/6644 46  <150 mg/dL Final   HDL 03/47/4259 76  >40 mg/dL Final   Total CHOL/HDL Ratio 06/23/2023 2.5  RATIO Final   VLDL 06/23/2023 9  0 -  40 mg/dL Final   LDL Cholesterol 06/23/2023 107 (H)  0 - 99 mg/dL Final   Comment:        Total Cholesterol/HDL:CHD Risk Coronary Heart Disease Risk Table                     Men   Women  1/2 Average Risk   3.4   3.3  Average Risk       5.0   4.4  2 X Average Risk   9.6   7.1  3 X Average Risk  23.4   11.0        Use the calculated Patient Ratio above and the CHD Risk Table to determine the patient's CHD Risk.        ATP III CLASSIFICATION (LDL):  <100     mg/dL   Optimal  161-096  mg/dL   Near or Above                    Optimal   130-159  mg/dL   Borderline  045-409  mg/dL   High  >811     mg/dL   Very High Performed at [facility], [address], [place], [place] [address]    TSH 06/23/2023 1.229  0.350 - 4.500 uIU/mL Final   Comment: Performed by a 3rd Generation assay with a functional sensitivity of <=0.01 uIU/mL. Performed at [facility], [address] [place], [place] [address]   Admission on 06/20/2023, Discharged on 06/20/2023  Component Date Value Ref Range Status   Sodium 06/20/2023 134 (L)  135 - 145 mmol/L Final   Potassium 06/20/2023 3.9  3.5 - 5.1 mmol/L Final   Chloride 06/20/2023 100  98 - 111 mmol/L Final   CO2 06/20/2023 22  22 - 32 mmol/L Final   Glucose, Bld 06/20/2023 77  70 - 99 mg/dL Final   Glucose reference range applies only to samples taken after fasting for at least 8 hours.   BUN 06/20/2023 8  6 - 20 mg/dL Final   Creatinine, Ser 06/20/2023 0.85  0.61 - 1.24 mg/dL Final   Calcium 13/12/6576 9.0  8.9 - 10.3 mg/dL Final   GFR, Estimated 06/20/2023 >60  >60 mL/min Final   Comment: (NOTE) Calculated using the CKD-EPI Creatinine Equation (2021)    Anion gap 06/20/2023 12  5 - 15 Final   Performed at [facility], [address] [place], [place] [address]   WBC 06/20/2023 5.0  4.0 - 10.5 K/uL Final   RBC 06/20/2023 5.19  4.22 - 5.81 MIL/uL Final   Hemoglobin 06/20/2023 15.2  13.0 - 17.0 g/dL Final   HCT 95/28/4132 43.9  39.0 - 52.0 % Final   MCV 06/20/2023 84.6  80.0 - 100.0 fL Final   MCH 06/20/2023 29.3  26.0 - 34.0 pg Final   MCHC 06/20/2023 34.6  30.0 - 36.0 g/dL Final   RDW 44/05/270 14.1  11.5 - 15.5 % Final   Platelets 06/20/2023 239  150 - 400 K/uL Final   nRBC 06/20/2023 0.0  0.0 - 0.2 % Final   Performed at [facility], [address] [place], [place] [address]   Troponin I (High Sensitivity) 06/20/2023 21 (H)  <18 ng/L Final   Comment: (NOTE) Elevated high sensitivity troponin I (hsTnI) values and significant  changes across serial  measurements may suggest ACS but many other  chronic and acute conditions are known to elevate hsTnI  results.  Refer to the "Links" section for chest pain algorithms and additional  guidance. Performed at [facility], [address], [place], [place] [address]    B Natriuretic Peptide 06/20/2023 54.9  0.0 - 100.0 pg/mL Final   Performed at [facility], [address] [place], [place] [address]   Troponin I (High Sensitivity) 06/20/2023 19 (H)  <18 ng/L Final   Comment: (NOTE) Elevated high sensitivity troponin I (hsTnI) values and significant  changes across serial measurements may suggest ACS but many other  chronic and acute conditions are known to elevate hsTnI results.  Refer to the "Links" section for chest pain algorithms and additional  guidance. Performed at [facility], [address] [place], [place] [address]   Admission on 06/17/2023, Discharged on 06/18/2023  Component Date Value Ref Range Status   Sodium 06/17/2023 137  135 - 145 mmol/L Final   Potassium 06/17/2023 3.4 (L)  3.5 - 5.1 mmol/L Final   Chloride 06/17/2023 102  98 - 111 mmol/L Final   CO2 06/17/2023 23  22 - 32 mmol/L Final   Glucose, Bld 06/17/2023 84  70 - 99 mg/dL Final   Glucose reference range applies only to samples taken after fasting for at least 8 hours.   BUN 06/17/2023 5 (L)  6 - 20 mg/dL Final   Creatinine, Ser 06/17/2023 1.01  0.61 - 1.24 mg/dL Final   Calcium 96/29/5284 8.9  8.9 - 10.3 mg/dL Final   GFR, Estimated 06/17/2023 >60  >60 mL/min Final   Comment: (NOTE) Calculated using the CKD-EPI Creatinine Equation (2021)    Anion gap 06/17/2023 12  5 - 15 Final   Performed at [facility], [address], [place], [place] [address]   WBC 06/17/2023 5.0  4.0 - 10.5 K/uL Final   RBC 06/17/2023 4.63  4.22 - 5.81 MIL/uL Final   Hemoglobin 06/17/2023 13.4  13.0 - 17.0 g/dL Final   HCT 05/16/7251 39.7  39.0 - 52.0 % Final   MCV 06/17/2023 85.7  80.0 - 100.0 fL Final    MCH 06/17/2023 28.9  26.0 - 34.0 pg Final   MCHC 06/17/2023 33.8  30.0 - 36.0 g/dL Final   RDW 66/44/0347 14.6  11.5 - 15.5 % Final   Platelets 06/17/2023 234  150 - 400 K/uL Final   nRBC 06/17/2023 0.0  0.0 - 0.2 % Final   Performed at [facility], [address] [place], [place] [address]   Troponin I (High Sensitivity) 06/17/2023 36 (H)  <18 ng/L Final   Comment: (NOTE) Elevated high sensitivity troponin I (hsTnI) values and significant  changes across serial measurements may suggest ACS but many other  chronic and acute conditions are known to elevate hsTnI results.  Refer to the "Links" section for chest pain algorithms and additional  guidance. Performed at [facility], [address] [place], [place] [address]    B Natriuretic Peptide 06/17/2023 185.1 (H)  0.0 - 100.0 pg/mL Final   Performed at [facility], [address] [place], [place] [address]   SARS Coronavirus 2 by RT PCR 06/17/2023 NEGATIVE  NEGATIVE Final   Influenza A by PCR 06/17/2023 NEGATIVE  NEGATIVE Final   Influenza B by PCR 06/17/2023 NEGATIVE  NEGATIVE Final   Comment: (NOTE) The Xpert Xpress SARS-CoV-2/FLU/RSV plus assay is intended as an aid in the diagnosis of influenza from Nasopharyngeal swab specimens and should not be used as  a sole basis for treatment. Nasal washings and aspirates are unacceptable for Xpert Xpress SARS-CoV-2/FLU/RSV testing.  Fact Sheet for Patients: BloggerCourse.com  Fact Sheet for Healthcare Providers: SeriousBroker.it  This test is not yet approved or cleared by the [place] FDA and has been authorized for detection and/or diagnosis of SARS-CoV-2 by FDA under an Emergency Use Authorization (EUA). This EUA will remain in effect (meaning this test can be used) for the duration of the COVID-19 declaration under Section 564(b)(1) of the Act, 21 U.S.C. section 360bbb-3(b)(1), unless the authorization  is terminated or revoked.     Resp Syncytial Virus by PCR 06/17/2023 NEGATIVE  NEGATIVE Final   Comment: (NOTE) Fact Sheet for Patients: BloggerCourse.com  Fact Sheet for Healthcare Providers: SeriousBroker.it  This test is not yet approved or cleared by the [place] FDA and has been authorized for detection and/or diagnosis of SARS-CoV-2 by FDA under an Emergency Use Authorization (EUA). This EUA will remain in effect (meaning this test can be used) for the duration of the COVID-19 declaration under Section 564(b)(1) of the Act, 21 U.S.C. section 360bbb-3(b)(1), unless the authorization is terminated or revoked.  Performed at [facility], [address] [place], [place] [address]    Troponin I (High Sensitivity) 06/17/2023 37 (H)  <18 ng/L Final   Comment: (NOTE) Elevated high sensitivity troponin I (hsTnI) values and significant  changes across serial measurements may suggest ACS but many other  chronic and acute conditions are known to elevate hsTnI results.  Refer to the "Links" section for chest pain algorithms and additional  guidance. Performed at [facility], [address], [place], [place] [address]   Admission on 01/18/2023, Discharged on 01/18/2023  Component Date Value Ref Range Status   Cath EF Quantitative 01/18/2023 25  % Final   Glucose-Capillary 01/18/2023 94  70 - 99 mg/dL Final   Glucose reference range applies only to samples taken after fasting for at least 8 hours.   Comment 1 01/18/2023 Notify RN   Final   Comment 2 01/18/2023 Document in Chart   Final   pH, Ven 01/18/2023 7.355  7.25 - 7.43 Final   pCO2, Ven 01/18/2023 42.9 (L)  44 - 60 mmHg Final   pO2, Ven 01/18/2023 47 (H)  32 - 45 mmHg Final   Bicarbonate 01/18/2023 23.9  20.0 - 28.0 mmol/L Final   TCO2 01/18/2023 25  22 - 32 mmol/L Final   O2 Saturation 01/18/2023 80  % Final   Acid-base deficit 01/18/2023 2.0  0.0 - 2.0  mmol/L Final   Sodium 01/18/2023 143  135 - 145 mmol/L Final   Potassium 01/18/2023 3.3 (L)  3.5 - 5.1 mmol/L Final   Calcium, Ion 01/18/2023 1.13 (L)  1.15 - 1.40 mmol/L Final   HCT 01/18/2023 41.0  39.0 - 52.0 % Final   Hemoglobin 01/18/2023 13.9  13.0 - 17.0 g/dL Final   Sample type 95/28/4132 VENOUS   Final   pH, Ven 01/18/2023 7.350  7.25 - 7.43 Final   pCO2, Ven 01/18/2023 45.2  44 - 60 mmHg Final   pO2, Ven 01/18/2023 44  32 - 45 mmHg Final   Bicarbonate 01/18/2023 24.9  20.0 - 28.0 mmol/L Final   TCO2 01/18/2023 26  22 - 32 mmol/L Final   O2 Saturation 01/18/2023 77  % Final   Acid-base deficit 01/18/2023 1.0  0.0 - 2.0 mmol/L Final   Sodium 01/18/2023 141  135 - 145 mmol/L Final   Potassium  01/18/2023 3.5  3.5 - 5.1 mmol/L Final   Calcium, Ion 01/18/2023 1.20  1.15 - 1.40 mmol/L Final   HCT 01/18/2023 42.0  39.0 - 52.0 % Final   Hemoglobin 01/18/2023 14.3  13.0 - 17.0 g/dL Final   Sample type 82/95/6213 VENOUS   Final   pH, Arterial 01/18/2023 7.357  7.35 - 7.45 Final   pCO2 arterial 01/18/2023 37.3  32 - 48 mmHg Final   pO2, Arterial 01/18/2023 138 (H)  83 - 108 mmHg Final   Bicarbonate 01/18/2023 20.9  20.0 - 28.0 mmol/L Final   TCO2 01/18/2023 22  22 - 32 mmol/L Final   O2 Saturation 01/18/2023 99  % Final   Acid-base deficit 01/18/2023 4.0 (H)  0.0 - 2.0 mmol/L Final   Sodium 01/18/2023 147 (H)  135 - 145 mmol/L Final   Potassium 01/18/2023 2.9 (L)  3.5 - 5.1 mmol/L Final   Calcium, Ion 01/18/2023 0.96 (L)  1.15 - 1.40 mmol/L Final   HCT 01/18/2023 38.0 (L)  39.0 - 52.0 % Final   Hemoglobin 01/18/2023 12.9 (L)  13.0 - 17.0 g/dL Final   Sample type 08/65/7846 ARTERIAL   Final   pH, Ven 01/18/2023 7.347  7.25 - 7.43 Final   pCO2, Ven 01/18/2023 45.2  44 - 60 mmHg Final   pO2, Ven 01/18/2023 48 (H)  32 - 45 mmHg Final   Bicarbonate 01/18/2023 24.8  20.0 - 28.0 mmol/L Final   TCO2 01/18/2023 26  22 - 32 mmol/L Final   O2 Saturation 01/18/2023 81  % Final   Acid-base  deficit 01/18/2023 1.0  0.0 - 2.0 mmol/L Final   Sodium 01/18/2023 141  135 - 145 mmol/L Final   Potassium 01/18/2023 3.6  3.5 - 5.1 mmol/L Final   Calcium, Ion 01/18/2023 1.31  1.15 - 1.40 mmol/L Final   HCT 01/18/2023 44.0  39.0 - 52.0 % Final   Hemoglobin 01/18/2023 15.0  13.0 - 17.0 g/dL Final   Sample type 96/29/5284 VENOUS   Final  Hospital Outpatient Visit on 01/02/2023  Component Date Value Ref Range Status   Sodium 01/02/2023 140  135 - 145 mmol/L Final   Potassium 01/02/2023 4.1  3.5 - 5.1 mmol/L Final   Chloride 01/02/2023 98  98 - 111 mmol/L Final   CO2 01/02/2023 28  22 - 32 mmol/L Final   Glucose, Bld 01/02/2023 91  70 - 99 mg/dL Final   Glucose reference range applies only to samples taken after fasting for at least 8 hours.   BUN 01/02/2023 11  6 - 20 mg/dL Final   Creatinine, Ser 01/02/2023 0.87  0.61 - 1.24 mg/dL Final   Calcium 13/24/4010 10.0  8.9 - 10.3 mg/dL Final   GFR, Estimated 01/02/2023 >60  >60 mL/min Final   Comment: (NOTE) Calculated using the CKD-EPI Creatinine Equation (2021)    Anion gap 01/02/2023 14  5 - 15 Final   Performed at [facility], [address], [place], [place] [address]   WBC 01/02/2023 6.0  4.0 - 10.5 K/uL Final   RBC 01/02/2023 5.54  4.22 - 5.81 MIL/uL Final   Hemoglobin 01/02/2023 15.8  13.0 - 17.0 g/dL Final   HCT 66/44/0347 45.8  39.0 - 52.0 % Final   MCV 01/02/2023 82.7  80.0 - 100.0 fL Final   MCH 01/02/2023 28.5  26.0 - 34.0 pg Final   MCHC 01/02/2023 34.5  30.0 - 36.0 g/dL Final   RDW 42/59/5638 13.5  11.5 - 15.5 %  Final   Platelets 01/02/2023 243  150 - 400 K/uL Final   nRBC 01/02/2023 0.0  0.0 - 0.2 % Final   Performed at [facility], [address], [place], [place] [address]   B Natriuretic Peptide 01/02/2023 62.5  0.0 - 100.0 pg/mL Final   Performed at [facility], [address] [place], [place] [address]  Hospital Outpatient Visit on 01/02/2023  Component Date Value Ref Range Status   S'  Lateral 01/02/2023 5.70  cm Final   AV Area VTI 01/02/2023 1.50  cm2 Final   AV Mean grad 01/02/2023 3.5  mmHg Final   Single Plane A4C EF 01/02/2023 31.2  % Final   Single Plane A2C EF 01/02/2023 30.6  % Final   Calc EF 01/02/2023 30.0  % Final   AV Area mean vel 01/02/2023 1.61  cm2 Final   Area-P 1/2 01/02/2023 15.80  cm2 Final   AR max vel 01/02/2023 1.65  cm2 Final   AV Peak grad 01/02/2023 6.3  mmHg Final   Ao pk vel 01/02/2023 1.26  m/s Final   MV M vel 01/02/2023 1.49  m/s Final   MV Peak grad 01/02/2023 8.8  mmHg Final   P 1/2 time 01/02/2023 1,298  msec Final   Est EF 01/02/2023 25 - 30%   Final    Blood Alcohol level:  Lab Results  Component Value Date   ETH <10 06/23/2023    Metabolic Disorder Labs: Lab Results  Component Value Date   HGBA1C 5.1 06/23/2023   MPG 99.67 06/23/2023   MPG 88.19 06/04/2022   No results found for: "PROLACTIN" Lab Results  Component Value Date   CHOL 192 06/23/2023   TRIG 46 06/23/2023   HDL 76 06/23/2023   CHOLHDL 2.5 06/23/2023   VLDL 9 06/23/2023   LDLCALC 107 (H) 06/23/2023   LDLCALC 104 (H) 06/30/2021     Physical Findings   AUDIT    Flowsheet Row ED to Hosp-Admission (Discharged) from 05/06/2021 in [place] [facility]  Alcohol Use Disorder Identification Test Final Score (AUDIT) 6      CAGE-AID    Flowsheet Row ED to Hosp-Admission (Discharged) from 05/06/2021 in [place] [facility]  CAGE-AID Score 3      GAD-7    Flowsheet Row Office Visit from 05/11/2021 in [facility]  Total GAD-7 Score 1      PHQ2-9    Flowsheet Row ED from 06/23/2023 in [facility] Most recent reading at 06/26/2023 11:17 AM ED from 06/23/2023 in [facility] Most recent reading at 06/23/2023  4:18 AM Office Visit from 05/11/2021 in [facility] [place] Most recent reading at 05/11/2021  9:36 AM  PHQ-2 Total Score 0 6 0  PHQ-9 Total Score 3 19 2       Flowsheet Row ED from 06/23/2023 in [facility] Most recent reading at 06/26/2023  9:47 AM ED from 06/23/2023 in [facility] Most recent reading at 06/23/2023  4:18 AM ED from 06/20/2023 in [facility] at [facility] Most recent reading at 06/20/2023 12:10 AM  C-SSRS RISK CATEGORY No Risk Low Risk No Risk        Musculoskeletal  Strength & Muscle Tone: within normal limits Gait & Station: normal  Patient leans: N/A  Psychiatric Specialty Exam  Presentation  General Appearance:  Appropriate for Environment  Eye Contact: Good  Speech: Spontaneous with normal rate and volume  Speech Volume: Normal  Handedness: Right   Mood and Affect  Mood: Fine  Affect: Somewhat constricted   Thought Process  Thought Processes: Linear  Descriptions of Associations:Intact  Orientation:Full (Time, Place and Person)  Thought Content:WDL  Diagnosis of Schizophrenia or Schizoaffective disorder in past: No    Hallucinations:Hallucinations: None  Ideas of Reference:None  Suicidal Thoughts:Suicidal Thoughts: No  Homicidal Thoughts:Homicidal Thoughts: No   Sensorium  Memory: Immediate Fair; Recent Fair; Remote Fair  Judgment: Limited  Insight: Fair   Chartered certified accountant: Fair  Attention Span: Fair  Recall: [employer] of Knowledge: Fair  Language: Fair   Psychomotor Activity  Psychomotor Activity: Psychomotor Activity: Extrapyramidal Side Effects (EPS)   Assets  Assets: Manufacturing systems engineer; Desire for Improvement   Sleep  Sleep: Sleep: Good Number of Hours of Sleep: 7     Physical Exam  Physical Exam Vitals and nursing note reviewed.  Constitutional:      Appearance: Normal appearance.  HENT:     Head: Normocephalic and atraumatic.      Nose: No congestion.  Eyes:     Pupils: Pupils are equal, round, and reactive to light.  Cardiovascular:     Rate and Rhythm: Regular rhythm.     Heart sounds: Normal heart sounds.  Pulmonary:     Effort: Pulmonary effort is normal.  Skin:    General: Skin is warm.  Neurological:     General: No focal deficit present.     Mental Status: He is alert and oriented to person, place, and time. Mental status is at baseline.  Psychiatric:        Mood and Affect: Mood normal.        Behavior: Behavior normal.        Thought Content: Thought content normal.        Judgment: Judgment normal.    Review of Systems  Constitutional:  Negative for chills and fever.  HENT:  Negative for hearing loss and sore throat.   Eyes:  Negative for blurred vision.  Respiratory:  Negative for cough and shortness of breath.   Cardiovascular:  Negative for chest pain and palpitations.  Gastrointestinal:  Negative for nausea and vomiting.  Neurological:  Negative for dizziness and speech change.  Psychiatric/Behavioral:  Positive for substance abuse. Negative for depression and suicidal ideas.   All other systems reviewed and are negative.  Blood pressure 128/68, pulse 63, temperature 98.1 F (36.7 C), temperature source Oral, resp. rate 18, SpO2 99%. There is no height or weight on file to calculate BMI.  Treatment Plan Summary: Daily contact with patient to assess and evaluate symptoms and progress in treatment and Medication management  Alcohol use disorder Stimulant use disorder-cocaine -Advise cessation -continue CIWA -Continue Naltrexone 50 mg QHS  Chronic systolic heart failure Hypertension Dyslipidemia -Continue Lipitor 80 mg daily -Continue Coreg 12.5 mg twice daily with meals -Replace Farxiga with Jardiance 10 mg daily -Continue Lasix 40 mg as needed for fluid/edema -Continue Entresto 1 tablet twice daily -Continue Aldactone 25 mg daily   Disposition: Social work continues to  seek placement in residential Rehab facility   Maryagnes Amos, FNP 06/26/2023 3:38 PM

## 2023-06-26 NOTE — Discharge Planning (Signed)
Referral was received and per Lupita Leash at [facility], patient has been accepted and can transfer to the facility on tomorrow by 9:00am. Update has been provided to the patient and MD made aware. Patient will need a 7-14 day supply of medication and one month refill. No nicotine gum allowed, however 14-30 day nicotine patches to be provided if needed. No other needs to report at this time.    LCSW will continue to follow up and provide updates as received.    Fernande Boyden, LCSW Clinical Social Worker [place] [facility] Ph: [phone]

## 2023-06-26 NOTE — ED Notes (Signed)
Patient is sleeping. Respirations equal and unlabored, skin warm and dry. No change in assessment or acuity. Routine safety checks conducted according to facility protocol. Will continue to monitor for safety.

## 2023-06-26 NOTE — ED Notes (Signed)
Patient A&Ox4. Denies intent to harm self/others when asked. Denies A/VH. Patient denies any physical complaints when asked. No acute distress noted. Support and encouragement provided. Routine safety checks conducted according to facility protocol. Encouraged patient to notify staff if thoughts of harm toward self or others arise. Patient verbalize understanding and agreement. Will continue to monitor for safety.

## 2023-06-26 NOTE — ED Notes (Signed)
Pt sitting in cafeteria eating dinner, watching television and interacting with peers. No acute distress noted. No concerns voiced. Informed pt to notify staff with any needs or assistance. Pt verbalized understanding and agreement. Will continue to monitor for safety.

## 2023-06-26 NOTE — ED Notes (Signed)
Pt is in the dayroom watching TV with peers. Pt denies SI/HI/AVH. Pt has no further complain.No acute distress noted. Will continue to monitor for safety and provide support.

## 2023-06-26 NOTE — Group Note (Signed)
Group Topic: Relapse and Recovery  Group Date: 06/26/2023 Start Time: 2005 End Time: 2100 Facilitators: Darin Engels  Department: [facility]  Number of Participants: 6  Group Focus: abuse issues, acceptance, coping skills, personal responsibility, and substance abuse education Treatment Modality:  Leisure Development Interventions utilized were leisure development, patient education, story telling, and support Purpose: enhance coping skills, express feelings, regain self-worth, and relapse prevention strategies  Name: Joseph Reese Date of Birth: January 21, 1966  MR: [id number]    Level of Participation: active Quality of Participation: attentive, cooperative, engaged, and supportive Interactions with others: gave feedback Mood/Affect: appropriate and positive Triggers (if applicable): n/a Cognition: coherent/clear Progress: Gaining insight Response: pt engaged in story telling during group and offered support to other group participants  Plan: patient will be encouraged to continue attending group   Patients Problems:  Patient Active Problem List   Diagnosis Date Noted   Substance abuse (HCC) 06/23/2023   Acute respiratory failure with hypoxia (HCC) 05/06/2021   Acute pulmonary edema (HCC) 05/06/2021   Hypertensive emergency

## 2023-06-26 NOTE — Group Note (Signed)
Group Topic: Decisional Balance/Substance Abuse  Group Date: 06/26/2023 Start Time: 1415 End Time: 1500 Facilitators: Concha Norway, NT  Department: [facility]  Number of Participants: 8  Group Focus: concentration Treatment Modality:  Behavior Modification Therapy Interventions utilized were exploration Purpose: enhance coping skills  Name: Doy Taaffe Date of Birth: [birth year]  MR: [id number]    Level of Participation: active Quality of Participation: attentive Interactions with others: gave feedback Mood/Affect: appropriate Triggers (if applicable):  Cognition: coherent/clear Progress: Moderate Response:  Plan: follow-up needed  Patients Problems:  Patient Active Problem List   Diagnosis Date Noted   Substance abuse (HCC) 06/23/2023   Acute respiratory failure with hypoxia (HCC) 05/06/2021   Acute pulmonary edema (HCC) 05/06/2021   Hypertensive emergency

## 2023-06-26 NOTE — ED Notes (Signed)
Pt sitting in dayroom watching television and interacting with peers. No acute distress noted. No concerns voiced. Informed pt to notify staff with any needs or assistance. Pt verbalized understanding and agreement. Will continue to monitor for safety.

## 2023-06-26 NOTE — Group Note (Signed)
Group Topic: Communication  Group Date: 06/26/2023 Start Time: 0935 End Time: 1015 Facilitators: Salina April, RN  Department: [facility]  Number of Participants: 4  Group Focus: nursing group Treatment Modality:  Psychoeducation Interventions utilized were patient education Purpose: increase insight and trigger / craving management  Name: Joseph Reese Date of Birth: 1965/07/15  MR: [id number]    Level of Participation: moderate Quality of Participation: cooperative Interactions with others: gave feedback Mood/Affect: appropriate Triggers (if applicable): none Cognition: coherent/clear Progress: Moderate Response: verbalized understanding Plan: follow-up needed  Patients Problems:  Patient Active Problem List   Diagnosis Date Noted   Substance abuse (HCC) 06/23/2023   Acute respiratory failure with hypoxia (HCC) 05/06/2021   Acute pulmonary edema (HCC) 05/06/2021   Hypertensive emergency

## 2023-06-26 NOTE — ED Notes (Signed)
Pt is in the bed resting. Respirations are even and unlabored. No acute distress noted. Will continue to monitor for safety

## 2023-06-27 ENCOUNTER — Other Ambulatory Visit (HOSPITAL_COMMUNITY): Admission: EM | Admit: 2023-06-27 | Discharge: 2023-06-29 | Payer: 59 | Attending: Psychiatry | Admitting: Psychiatry

## 2023-06-27 DIAGNOSIS — E785 Hyperlipidemia, unspecified: Secondary | ICD-10-CM

## 2023-06-27 DIAGNOSIS — E119 Type 2 diabetes mellitus without complications: Secondary | ICD-10-CM

## 2023-06-27 DIAGNOSIS — F1910 Other psychoactive substance abuse, uncomplicated: Secondary | ICD-10-CM

## 2023-06-27 DIAGNOSIS — F332 Major depressive disorder, recurrent severe without psychotic features: Secondary | ICD-10-CM | POA: Diagnosis present

## 2023-06-27 DIAGNOSIS — I5022 Chronic systolic (congestive) heart failure: Secondary | ICD-10-CM | POA: Diagnosis not present

## 2023-06-27 DIAGNOSIS — F1010 Alcohol abuse, uncomplicated: Secondary | ICD-10-CM

## 2023-06-27 DIAGNOSIS — F1414 Cocaine abuse with cocaine-induced mood disorder: Secondary | ICD-10-CM

## 2023-06-27 DIAGNOSIS — I10 Essential (primary) hypertension: Secondary | ICD-10-CM

## 2023-06-27 DIAGNOSIS — F1020 Alcohol dependence, uncomplicated: Secondary | ICD-10-CM

## 2023-06-27 HISTORY — DX: Cocaine abuse with cocaine-induced mood disorder: F14.14

## 2023-06-27 HISTORY — DX: Major depressive disorder, recurrent severe without psychotic features: F33.2

## 2023-06-27 HISTORY — DX: Alcohol abuse, uncomplicated: F10.10

## 2023-06-27 LAB — POCT URINE DRUG SCREEN - MANUAL ENTRY (I-SCREEN)
POC Amphetamine UR: NOT DETECTED
POC Buprenorphine (BUP): NOT DETECTED
POC Cocaine UR: NOT DETECTED
POC Marijuana UR: POSITIVE — AB
POC Methadone UR: NOT DETECTED
POC Methamphetamine UR: NOT DETECTED
POC Morphine: NOT DETECTED
POC Oxazepam (BZO): NOT DETECTED
POC Oxycodone UR: NOT DETECTED
POC Secobarbital (BAR): NOT DETECTED

## 2023-06-27 MED ORDER — HALOPERIDOL LACTATE 5 MG/ML IJ SOLN: 5.0000 mg | Freq: Three times a day (TID) | INTRAMUSCULAR | Status: DC | PRN

## 2023-06-27 MED ORDER — ATORVASTATIN CALCIUM 80 MG PO TABS: 80.0000 mg | ORAL_TABLET | Freq: Every day | ORAL | 3 refills | Status: DC

## 2023-06-27 MED ORDER — ALUM & MAG HYDROXIDE-SIMETH 200-200-20 MG/5ML PO SUSP: 30.0000 mL | ORAL | Status: DC | PRN

## 2023-06-27 MED ORDER — DIPHENHYDRAMINE HCL 50 MG/ML IJ SOLN: 50.0000 mg | Freq: Three times a day (TID) | INTRAMUSCULAR | Status: DC | PRN

## 2023-06-27 MED ORDER — SPIRONOLACTONE 25 MG PO TABS: 25.0000 mg | ORAL_TABLET | Freq: Every day | ORAL | 3 refills | Status: DC

## 2023-06-27 MED ORDER — ATORVASTATIN CALCIUM 80 MG PO TABS: 80.0000 mg | ORAL_TABLET | Freq: Every day | ORAL | 0 refills | Status: DC

## 2023-06-27 MED ORDER — FUROSEMIDE 40 MG PO TABS: 40.0000 mg | ORAL_TABLET | ORAL | 4 refills | Status: DC | PRN

## 2023-06-27 MED ORDER — MAGNESIUM HYDROXIDE 400 MG/5ML PO SUSP: 30.0000 mL | Freq: Every day | ORAL | Status: DC | PRN

## 2023-06-27 MED ORDER — ATORVASTATIN CALCIUM 40 MG PO TABS
80.0000 mg | ORAL_TABLET | Freq: Every day | ORAL | Status: DC
  Administered 2023-06-28: 80 mg via ORAL
  Filled 2023-06-27: qty 2

## 2023-06-27 MED ORDER — CARVEDILOL 12.5 MG PO TABS: 12.5000 mg | ORAL_TABLET | Freq: Two times a day (BID) | ORAL | 3 refills | Status: DC

## 2023-06-27 MED ORDER — LIDOCAINE 5 % EX PTCH
1.0000 | MEDICATED_PATCH | CUTANEOUS | Status: DC
  Administered 2023-06-27: 1 via TRANSDERMAL
  Filled 2023-06-27 (×2): qty 1

## 2023-06-27 MED ORDER — NALTREXONE HCL 50 MG PO TABS: 50.0000 mg | ORAL_TABLET | Freq: Every day | ORAL | 0 refills | Status: DC

## 2023-06-27 MED ORDER — EMPAGLIFLOZIN 10 MG PO TABS: 10.0000 mg | ORAL_TABLET | Freq: Every day | ORAL | 0 refills | Status: DC

## 2023-06-27 MED ORDER — TRAZODONE HCL 50 MG PO TABS: 50.0000 mg | ORAL_TABLET | Freq: Every evening | ORAL | 0 refills | Status: DC | PRN

## 2023-06-27 MED ORDER — LIDOCAINE 5 % EX PTCH: 1.0000 | MEDICATED_PATCH | CUTANEOUS | 0 refills | Status: DC

## 2023-06-27 MED ORDER — EMPAGLIFLOZIN 10 MG PO TABS
10.0000 mg | ORAL_TABLET | Freq: Every day | ORAL | Status: DC
  Administered 2023-06-28: 10 mg via ORAL
  Filled 2023-06-27: qty 1

## 2023-06-27 MED ORDER — TRAZODONE HCL 50 MG PO TABS
50.0000 mg | ORAL_TABLET | Freq: Every evening | ORAL | Status: DC | PRN
  Administered 2023-06-27 – 2023-06-28 (×2): 50 mg via ORAL
  Filled 2023-06-27 (×2): qty 1

## 2023-06-27 MED ORDER — EMPAGLIFLOZIN 10 MG PO TABS: 10.0000 mg | ORAL_TABLET | Freq: Every day | ORAL | 11 refills | Status: DC

## 2023-06-27 MED ORDER — FUROSEMIDE 40 MG PO TABS: 40.0000 mg | ORAL_TABLET | ORAL | 0 refills | Status: DC | PRN

## 2023-06-27 MED ORDER — HYDROXYZINE HCL 25 MG PO TABS
25.0000 mg | ORAL_TABLET | Freq: Four times a day (QID) | ORAL | Status: DC | PRN
  Administered 2023-06-28: 25 mg via ORAL
  Filled 2023-06-27: qty 1

## 2023-06-27 MED ORDER — TRAZODONE HCL 50 MG PO TABS
50.0000 mg | ORAL_TABLET | Freq: Once | ORAL | Status: AC
  Administered 2023-06-27: 50 mg via ORAL
  Filled 2023-06-27: qty 1

## 2023-06-27 MED ORDER — CARVEDILOL 12.5 MG PO TABS: 12.5000 mg | ORAL_TABLET | Freq: Two times a day (BID) | ORAL | 0 refills | Status: DC

## 2023-06-27 MED ORDER — SACUBITRIL-VALSARTAN 24-26 MG PO TABS
1.0000 | ORAL_TABLET | Freq: Two times a day (BID) | ORAL | Status: DC
  Administered 2023-06-27 – 2023-06-28 (×3): 1 via ORAL
  Filled 2023-06-27 (×5): qty 1

## 2023-06-27 MED ORDER — SACUBITRIL-VALSARTAN 24-26 MG PO TABS
1.0000 | ORAL_TABLET | Freq: Two times a day (BID) | ORAL | 0 refills | Status: DC
Start: 2023-06-27 — End: 2023-06-27

## 2023-06-27 MED ORDER — ACETAMINOPHEN 325 MG PO TABS
650.0000 mg | ORAL_TABLET | Freq: Four times a day (QID) | ORAL | Status: DC | PRN
  Administered 2023-06-28 (×2): 650 mg via ORAL
  Filled 2023-06-27 (×2): qty 2

## 2023-06-27 MED ORDER — HYDROXYZINE HCL 25 MG PO TABS: 25.0000 mg | ORAL_TABLET | Freq: Four times a day (QID) | ORAL | 0 refills | Status: DC | PRN

## 2023-06-27 MED ORDER — HYDROXYZINE HCL 25 MG PO TABS
25.0000 mg | ORAL_TABLET | Freq: Four times a day (QID) | ORAL | 0 refills | Status: DC | PRN
Start: 2023-06-27 — End: 2023-06-27

## 2023-06-27 MED ORDER — SACUBITRIL-VALSARTAN 24-26 MG PO TABS: 1.0000 | ORAL_TABLET | Freq: Two times a day (BID) | ORAL | 0 refills | Status: DC

## 2023-06-27 MED ORDER — FUROSEMIDE 40 MG PO TABS: 40.0000 mg | ORAL_TABLET | ORAL | Status: DC | PRN

## 2023-06-27 MED ORDER — LORAZEPAM 2 MG/ML IJ SOLN: 2.0000 mg | Freq: Three times a day (TID) | INTRAMUSCULAR | Status: DC | PRN

## 2023-06-27 MED ORDER — CARVEDILOL 12.5 MG PO TABS
12.5000 mg | ORAL_TABLET | Freq: Two times a day (BID) | ORAL | Status: DC
  Administered 2023-06-27 – 2023-06-28 (×3): 12.5 mg via ORAL
  Filled 2023-06-27 (×3): qty 1

## 2023-06-27 MED ORDER — SPIRONOLACTONE 25 MG PO TABS: 25.0000 mg | ORAL_TABLET | Freq: Every day | ORAL | 0 refills | Status: DC

## 2023-06-27 MED ORDER — HYDROXYZINE HCL 25 MG PO TABS
25.0000 mg | ORAL_TABLET | Freq: Four times a day (QID) | ORAL | Status: DC | PRN
  Filled 2023-06-27: qty 10

## 2023-06-27 MED ORDER — NALTREXONE HCL 50 MG PO TABS
50.0000 mg | ORAL_TABLET | Freq: Every day | ORAL | Status: DC
  Administered 2023-06-27 – 2023-06-28 (×2): 50 mg via ORAL
  Filled 2023-06-27 (×2): qty 1

## 2023-06-27 NOTE — ED Notes (Signed)
Patients v/s = 157/93  provider made aware

## 2023-06-27 NOTE — ED Notes (Signed)
Pt is in the dayroom watching TV with peers. Pt denies SI/HI/AVH. Pt has no further complain.No acute distress noted. Will continue to monitor for safety and provide support.

## 2023-06-27 NOTE — ED Provider Notes (Signed)
FBC/OBS ASAP Discharge Summary  Date and Time: 06/27/2023 10:33 AM  Name: Joseph Reese  MRN:  [id number]   Discharge Diagnoses:  Final diagnoses:  Cocaine abuse with cocaine-induced mood disorder (HCC)  Alcohol use disorder, severe, in controlled environment [facility])  Hpi: Joseph Reese is a 58 y.o. male with PMHx of stimulant use disorder-cocaine, alcohol use disorder, systolic heart failure, dyslipidemia, and hypertension being admitted to [facility] for detox from alcohol and cocaine as well as seeking residential rehab.  Patient's primary motivation for quitting alcohol and cocaine is that it is affecting his heart. Per [facility]:  "Patient was evaluated face-to-face and his chart was reviewed by this nurse practitioner on 06/23/23. On assessment, patient reports longstanding history of substance abuse. Patient says he has been struggling with alcohol and cocaine abuse for over 40 years. He reports daily consumption of approximately 12 cans of beer and smokes around 0.5 grams of crack cocaine 3-4 times per week. He says his last use of alcohol (2 cans of 24oz beers and 6-7 shots of liquor) and cocaine was approximately 4 hours ago. The patient also notes occasional marijuana use, last use was 4 days ago.   He says he is motivated to seek substance abuse treatment due to his declining health. He has a known history of cardiovascular disease (Heart failure); he reports that he is followed by the [facility] due to the severity of his heart failure and acknowledges that his ongoing substance abuse is harmful to his health. He says that he is depressed due to his struggle with substance abuse, failing health, homelessness, and unemployment. He is endorsing depressive symptoms of isolation, restlessness, persistent feelings of guilt and hopelessness. He admits to suicidal ideation however, he denies any plan or intent to harm himself. He also denies any homicidal ideation, paranoia, and hallucination.     During assessment patient is alert and oriented x 4.  He is calm and cooperative.  Patient is speaking in a clear tone, normal rate, volume, and pace with good eye contact.  Patient's mood is depressed with a congruent affect.  His thought process is coherent.  Patient's judgment is fair, insight is intact.  Objectively patient did not appear to be responding to any stimuli during this interaction."  Patient's longest period of sobriety was decades ago for approximately 3 years after he had gone to detox and residential rehab.  He denies feeling suicidal anymore and denies HI/AVH.  Subjective: Doing well ready to continue my treatment.   Stay Summary: Patient's care was discussed during the interdisciplinary team meeting every day during the hospitalization.The patient denies having side effects to prescribed psychiatric medication. Gradually, patient started adjusting to milieu. The patient was evaluated each day by a clinical provider to ascertain response to treatment. Improvement was noted by the patient's report of decreasing symptoms, improved sleep and appetite, affect, medication tolerance, behavior, and participation in unit programming.  Patient was asked each day to complete a self inventory noting mood, mental status, pain, new symptoms, anxiety and concerns.  Symptoms were reported as significantly decreased or resolved completely by discharge.    On day of discharge, the patient reports that their mood is stable. The patient denied having suicidal thoughts for more than 48 hours prior to discharge.  Patient denies having homicidal thoughts.  Patient denies having auditory hallucinations.  Patient denies any visual hallucinations or other symptoms of psychosis. The patient was motivated to continue taking medication with a goal of continued improvement in  mental health.    The patient reported that their withdrawal symptoms and cravings responded well to the detox regimen, with overall  benefit from the detox program. Supportive psychotherapy was provided, and the patient participated in regular group therapy sessions focused on managing cravings and withdrawal. Coping skills, problem-solving, and relaxation techniques were also part of the program's therapeutic interventions.     Total Time spent with patient: 1 hour   Tobacco Cessation:  N/A, patient does not currently use tobacco products  Current Medications:  Current Facility-Administered Medications  Medication Dose Route Frequency Provider Last Rate Last Admin   acetaminophen (TYLENOL) tablet 650 mg  650 mg Oral Q6H PRN Ajibola, Ene A, NP   650 mg at 06/25/23 1244   alum & mag hydroxide-simeth (MAALOX/MYLANTA) 200-200-20 MG/5ML suspension 30 mL  30 mL Oral Q4H PRN Ajibola, Ene A, NP       atorvastatin (LIPITOR) tablet 80 mg  80 mg Oral Daily Ajibola, Ene A, NP   80 mg at 06/27/23 0849   carvedilol (COREG) tablet 12.5 mg  12.5 mg Oral BID WC Onuoha, Chinwendu V, NP   12.5 mg at 06/27/23 0850   empagliflozin (JARDIANCE) tablet 10 mg  10 mg Oral QAC breakfast Ajibola, Ene A, NP   10 mg at 06/27/23 0850   furosemide (LASIX) tablet 40 mg  40 mg Oral PRN Ajibola, Ene A, NP       ibuprofen (ADVIL) tablet 400 mg  400 mg Oral Q6H PRN Ardis Hughs, NP   400 mg at 06/26/23 0315   lidocaine (LIDODERM) 5 % 1 patch  1 patch Transdermal Q24H Park Pope, MD   1 patch at 06/26/23 1537   magnesium hydroxide (MILK OF MAGNESIA) suspension 30 mL  30 mL Oral Daily PRN Ajibola, Ene A, NP       menthol-cetylpyridinium (CEPACOL) lozenge 3 mg  1 lozenge Oral PRN Onuoha, Chinwendu V, NP   3 mg at 06/25/23 0148   multivitamin with minerals tablet 1 tablet  1 tablet Oral Daily Ajibola, Ene A, NP   1 tablet at 06/27/23 0849   naltrexone (DEPADE) tablet 50 mg  50 mg Oral Seabron Spates, MD   50 mg at 06/26/23 2110   sacubitril-valsartan (ENTRESTO) 24-26 mg per tablet  1 tablet Oral BID Ajibola, Ene A, NP   1 tablet at 06/27/23 0849    spironolactone (ALDACTONE) tablet 25 mg  25 mg Oral Daily Ajibola, Ene A, NP   25 mg at 06/27/23 0849   thiamine (VITAMIN B1) injection 100 mg  100 mg Intramuscular Once Ajibola, Ene A, NP       thiamine (VITAMIN B1) tablet 100 mg  100 mg Oral Daily Ajibola, Ene A, NP   100 mg at 06/27/23 0850   traZODone (DESYREL) tablet 50 mg  50 mg Oral QHS PRN Ajibola, Ene A, NP   50 mg at 06/26/23 2110   Current Outpatient Medications  Medication Sig Dispense Refill   atorvastatin (LIPITOR) 80 MG tablet Take 1 tablet (80 mg total) by mouth daily. 30 tablet 0   carvedilol (COREG) 12.5 MG tablet Take 1 tablet (12.5 mg total) by mouth 2 (two) times daily with a meal. 30 tablet 0   empagliflozin (JARDIANCE) 10 MG TABS tablet Take 1 tablet (10 mg total) by mouth daily before breakfast. 30 tablet 0   furosemide (LASIX) 40 MG tablet Take 1 tablet (40 mg total) by mouth as needed for fluid or edema. 30 tablet 0  hydrOXYzine (ATARAX) 25 MG tablet Take 1 tablet (25 mg total) by mouth every 6 (six) hours as needed (anxiety/agitation or CIWA < or = 10). 30 tablet 0   lidocaine (LIDODERM) 5 % Place 1 patch onto the skin daily. Remove & Discard patch within 12 hours or as directed by MD 30 patch 0   naltrexone (DEPADE) 50 MG tablet Take 1 tablet (50 mg total) by mouth at bedtime. 30 tablet 0   sacubitril-valsartan (ENTRESTO) 24-26 MG Take 1 tablet by mouth 2 (two) times daily. 30 tablet 0   spironolactone (ALDACTONE) 25 MG tablet Take 1 tablet (25 mg total) by mouth daily. 30 tablet 0   traZODone (DESYREL) 50 MG tablet Take 1 tablet (50 mg total) by mouth at bedtime as needed for sleep. 30 tablet 0    PTA Medications:  PTA Medications  Medication Sig   atorvastatin (LIPITOR) 80 MG tablet Take 1 tablet (80 mg total) by mouth daily.   carvedilol (COREG) 12.5 MG tablet Take 1 tablet (12.5 mg total) by mouth 2 (two) times daily with a meal.   furosemide (LASIX) 40 MG tablet Take 1 tablet (40 mg total) by mouth as needed  for fluid or edema.   sacubitril-valsartan (ENTRESTO) 24-26 MG Take 1 tablet by mouth 2 (two) times daily.   spironolactone (ALDACTONE) 25 MG tablet Take 1 tablet (25 mg total) by mouth daily.   hydrOXYzine (ATARAX) 25 MG tablet Take 1 tablet (25 mg total) by mouth every 6 (six) hours as needed (anxiety/agitation or CIWA < or = 10).   traZODone (DESYREL) 50 MG tablet Take 1 tablet (50 mg total) by mouth at bedtime as needed for sleep.   empagliflozin (JARDIANCE) 10 MG TABS tablet Take 1 tablet (10 mg total) by mouth daily before breakfast.   naltrexone (DEPADE) 50 MG tablet Take 1 tablet (50 mg total) by mouth at bedtime.   lidocaine (LIDODERM) 5 % Place 1 patch onto the skin daily. Remove & Discard patch within 12 hours or as directed by MD   Facility Ordered Medications  Medication   acetaminophen (TYLENOL) tablet 650 mg   alum & mag hydroxide-simeth (MAALOX/MYLANTA) 200-200-20 MG/5ML suspension 30 mL   magnesium hydroxide (MILK OF MAGNESIA) suspension 30 mL   traZODone (DESYREL) tablet 50 mg   thiamine (VITAMIN B1) injection 100 mg   thiamine (VITAMIN B1) tablet 100 mg   multivitamin with minerals tablet 1 tablet   [EXPIRED] LORazepam (ATIVAN) tablet 1 mg   [EXPIRED] hydrOXYzine (ATARAX) tablet 25 mg   [EXPIRED] loperamide (IMODIUM) capsule 2-4 mg   [EXPIRED] ondansetron (ZOFRAN-ODT) disintegrating tablet 4 mg   atorvastatin (LIPITOR) tablet 80 mg   empagliflozin (JARDIANCE) tablet 10 mg   furosemide (LASIX) tablet 40 mg   spironolactone (ALDACTONE) tablet 25 mg   sacubitril-valsartan (ENTRESTO) 24-26 mg per tablet   lidocaine (LIDODERM) 5 % 1 patch   naltrexone (DEPADE) tablet 50 mg   menthol-cetylpyridinium (CEPACOL) lozenge 3 mg   carvedilol (COREG) tablet 12.5 mg   ibuprofen (ADVIL) tablet 400 mg   [COMPLETED] traZODone (DESYREL) tablet 50 mg       06/27/2023   10:32 AM 06/27/2023    8:29 AM 06/26/2023   11:17 AM  Depression screen PHQ 2/9  Decreased Interest 0 0 0   Down, Depressed, Hopeless 0 0 0  PHQ - 2 Score 0 0 0  Altered sleeping  0 1  Tired, decreased energy  0 1  Change in appetite  0 0  Feeling bad or failure about yourself   0 1  Trouble concentrating  0 0  Moving slowly or fidgety/restless  0 0  Suicidal thoughts  0 0  PHQ-9 Score  0 3  Difficult doing work/chores   Somewhat difficult    Flowsheet Row ED from 06/23/2023 in [facility] Most recent reading at 06/26/2023  9:47 AM ED from 06/23/2023 in [facility] Most recent reading at 06/23/2023  4:18 AM ED from 06/20/2023 in [facility] Most recent reading at 06/20/2023 12:10 AM  C-SSRS RISK CATEGORY No Risk Low Risk No Risk       Musculoskeletal  Strength & Muscle Tone: within normal limits Gait & Station: normal Patient leans: N/A  Psychiatric Specialty Exam  Presentation  General Appearance:  Appropriate for Environment; Casual  Eye Contact: Fair  Speech: Clear and Coherent; Normal Rate  Speech Volume: Normal  Handedness: Right   Mood and Affect  Mood: Euthymic  Affect: Appropriate; Congruent   Thought Process  Thought Processes: Coherent; Linear  Descriptions of Associations:Intact  Orientation:Full (Time, Place and Person)  Thought Content:WDL  Diagnosis of Schizophrenia or Schizoaffective disorder in past: No    Hallucinations:Hallucinations: None  Ideas of Reference:None  Suicidal Thoughts:Suicidal Thoughts: No  Homicidal Thoughts:Homicidal Thoughts: No   Sensorium  Memory: Immediate Good; Recent Fair; Remote Fair  Judgment: Fair  Insight: Good   Executive Functions  Concentration: Good  Attention Span: Good  Recall: Good  Fund of Knowledge: Good  Language: Good   Psychomotor Activity  Psychomotor Activity: Psychomotor Activity: Normal AIMS Completed?: No   Assets  Assets: Communication Skills; Desire for  Improvement   Sleep  Sleep: Sleep: Good Number of Hours of Sleep: 7   Nutritional Assessment (For OBS and FBC admissions only) Has the patient had a weight loss or gain of 10 pounds or more in the last 3 months?: No Has the patient had a decrease in food intake/or appetite?: No Does the patient have dental problems?: No Does the patient have eating habits or behaviors that may be indicators of an eating disorder including binging or inducing vomiting?: No Has the patient recently lost weight without trying?: 0 Has the patient been eating poorly because of a decreased appetite?: 0 Malnutrition Screening Tool Score: 0    Physical Exam  Physical Exam Vitals and nursing note reviewed.  Constitutional:      Appearance: Normal appearance. He is normal weight.  Skin:    Capillary Refill: Capillary refill takes less than 2 seconds.  Neurological:     General: No focal deficit present.     Mental Status: He is alert and oriented to person, place, and time. Mental status is at baseline.  Psychiatric:        Mood and Affect: Mood normal.        Behavior: Behavior normal.        Thought Content: Thought content normal.        Judgment: Judgment normal.    Review of Systems  Psychiatric/Behavioral: Negative.    All other systems reviewed and are negative.  Blood pressure 138/89, pulse 63, temperature 98.4 F (36.9 C), temperature source Oral, resp. rate 18, SpO2 100%. There is no height or weight on file to calculate BMI.  Demographic Factors:  Male, Low socioeconomic status, and Unemployed  Loss Factors: Decrease in vocational status, Decline in physical health, and Financial problems/change in socioeconomic status  Historical Factors: Family  history of mental illness or substance abuse and Impulsivity  Risk Reduction Factors:   Positive social support, Positive therapeutic relationship, and Positive coping skills or problem solving skills  Continued Clinical Symptoms:   Severe Anxiety and/or Agitation Alcohol/Substance Abuse/Dependencies More than one psychiatric diagnosis Unstable or Poor Therapeutic Relationship Previous Psychiatric Diagnoses and Treatments  Cognitive Features That Contribute To Risk:  None    Suicide Risk:  Minimal: No identifiable suicidal ideation.  Patients presenting with no risk factors but with morbid ruminations; may be classified as minimal risk based on the severity of the depressive symptoms  Plan Of Care/Follow-up recommendations:  Other:  Discharge to [facility] for inpatient rehab. Continue current medications listed below. He is being discharged with prescriptions for this at this time.  Alcohol use disorder Stimulant use disorder-cocaine -Advise cessation -continue CIWA -Continue Naltrexone 50 mg QHS   Chronic systolic heart failure Hypertension Dyslipidemia -Continue Lipitor 80 mg daily -Continue Coreg 12.5 mg twice daily with meals -Replace Farxiga with Jardiance 10 mg daily -Continue Lasix 40 mg as needed for fluid/edema -Continue Entresto 1 tablet twice daily -Continue Aldactone 25 mg daily   Disposition: Social work continues to seek placement in residential Rehab facility   Disposition: Discharge to [employer], FNP 06/27/2023, 10:33 AM

## 2023-06-27 NOTE — ED Notes (Signed)
Patient was discharged from unit this morning and transported to [facility] for follow up treatment however he was declined a bed when the learned that patient has chronic heart failure and is not being followed by cardiology.  Patient then brought himself back here and he was readmitted until Saturday when he will be transported by facility to [facility] treatment center .  They are aware of patients medical condition and are willing to give him a bed.  Patient has been calm and cooperative with the readmission process. He has been good natured and laughing at the mishap.  He is aware of the plans for [facility] treatment center and is accepting of it.  Utox is negative.

## 2023-06-27 NOTE — ED Notes (Signed)
Patient is sleeping. Respirations equal and unlabored, skin warm and dry. No change in assessment or acuity. Routine safety checks conducted according to facility protocol. Will continue to monitor for safety.

## 2023-06-28 DIAGNOSIS — I5022 Chronic systolic (congestive) heart failure: Secondary | ICD-10-CM | POA: Diagnosis not present

## 2023-06-28 DIAGNOSIS — F1910 Other psychoactive substance abuse, uncomplicated: Secondary | ICD-10-CM | POA: Diagnosis not present

## 2023-06-28 DIAGNOSIS — E119 Type 2 diabetes mellitus without complications: Secondary | ICD-10-CM | POA: Diagnosis not present

## 2023-06-28 DIAGNOSIS — E785 Hyperlipidemia, unspecified: Secondary | ICD-10-CM | POA: Diagnosis not present

## 2023-06-28 NOTE — Discharge Instructions (Signed)
Patient will be discharging to [facility] on tomorrow 06/29/2023 with transportation provided via [employer] from [facility] at 8:05am. [facility] will provide transport to [facility]. Address is [address] [place], [place] [address]. Staff will pick the patient up once he arrives that the station. Number for facility: [phone].   [facility] [address], [place], [address] [phone] phone  New Patient Assessment/Therapy Walk-Ins:  Monday and Wednesday: 8 am until slots are full. Every 1st and 2nd Fridays of the month: 1 pm - 5 pm.  NO ASSESSMENT/THERAPY WALK-INS ON TUESDAYS OR THURSDAYS  New Patient Assessment/Medication Management Walk-Ins:  Monday - Friday:  8 am - 11 am.  For all walk-ins, we ask that you arrive by 7:30 am because patients will be seen in the order of arrival.  Availability is limited; therefore, you may not be seen on the same day that you walk-in.  Our goal is to serve and meet the needs of our community to the best of our [place] ability.  SUBSTANCE USE TREATMENT for Medicaid and State Funded/IPRS  [facility] [address] [place], [place], [address] [phone] phone NOTE: [facility] is no longer offering IOP services.  Serves those who are low-income or have no insurance.  [facility] [address] [place], [place], [address] [phone] phone [phone] fax NOTE: Does have Substance Abuse-Intensive Outpatient Program [facility]) as well as transitional housing if eligible.  [facility] [address] [place], [place], [address] [phone] phone [phone] fax  [facility] [id number] [place], [place], [address] [phone] phone [phone] fax  HALFWAY HOUSES:  [facility] [phone]  [employer].[email or web address]  12 STEP PROGRAMS:  Alcoholics Anonymous of [place] [employer].be  Narcotics  Anonymous of [place] [employer].dk  Al-Anon of [place], [place] www.[place]alanon.org/find-meetings.html  Nar-Anon https://nar-anon.org/find-a-meetin  List of Residential placements:   [facility] in [place]: [phone]  [facility]: [phone]  [place], [place] [phone]: Male and male facility; 30-day program: (uninsured and Medicaid such as Laurena Bering, [facility] [place], partners)  [facility]: [phone]; men and women's facility; 28 days; Can have Medicaid tailored plan Tour manager or Partners)  [facility]: [phone] Karoline Caldwell or Larita Fife; 28 day program; must be fully detox; tailored Medicaid or no insurance  [address] in [place], [place]; [phone]; 28 day all males program; no insurance accepted  [facility] in [place]: Gabriel Rung [phone] (no insurance or Medicaid only); 90 days; outpatient services but provide housing in apartments downtown [place]  [facility] Admission: [phone]: Patient must complete phone screening for placement: [place], [place]; 6 month program; uninsured, Medicaid, and [employer].   [facility]: no insurance required; [phone]  [facility]: [phone]; Intake: Molly Maduro; Must fill out application online; Alecia Lemming Delay [phone] x [address] [facility] in [place], [place]: [phone]; Admissions Coordinators Mr. Maurine Minister or Belmont Valli; 90 day program.  [facility]: [place], [place] [phone]; Co-Ed 9 month to a year program; Online application; Men entry fee is $500 (6-80months);  [employer]: [address] [place], [place] [address]; no fee or insurance required; minimum of 2 years; Highly structured; work based; Intake Coordinator is Thayer Ohm [phone]  [facility] in [place], [place]: [phone]; Fax number is [phone]; website: www.[facility].org;  Requires 3-6 page autobiography; 2 year program (18 months and then 74month transitional housing); Admission fee is $300; no insurance needed; work Automotive engineer in  [place], [place]: Front Desk Staff: Danise Edge [phone]: They have a Men's Regenerations Program 6-34months. Free program; There is an initial $300 fee however, they are willing to work with patients regarding that. Application is online.  [facility]: Admissions [phone] Doran Heater ext [phone]; Any 7-90 day program is out of pocket; 12 month program is free of charge; there is a $275 entry fee; Patient is responsible for own transportation

## 2023-06-28 NOTE — [facility] Group Notes (Signed)
SPIRITUALITY GROUP NOTE  Spirituality group facilitated by Wilkie Aye, MDiv, B[facility].  Group Description: Group focused on topic of hope. Patients participated in facilitated discussion around topic, connecting with one another around experiences and definitions for hope. Group members engaged with visual explorer photos, reflecting on what hope looks like for them today. Group engaged in discussion around how their definitions of hope are present today in hospital.  Modalities: Psycho-social ed, Adlerian, Narrative, MI  Patient Progress: Present throughout group.  Engaged in group dialog - identified hope in his faith practice and in remembering who he is / what his mother taught him.  Expressed appreciation for another group member defining hope as "doing the work / the path".  DJ spoke about persistence and perseverance

## 2023-06-28 NOTE — ED Notes (Signed)
Patient alert & oriented x4. Denies intent to harm self or others when asked. Denies A/VH. Patient denies any physical complaints when asked. Patient administered all scheduled medications with no complications. No acute distress noted. Support and encouragement provided. Routine safety checks conducted per facility protocol. Encouraged patient to notify staff if any thoughts of harm towards self or others arise. Patient verbalizes understanding and agreement.

## 2023-06-28 NOTE — Group Note (Signed)
Group Topic: Recovery Basics  Group Date: 06/28/2023 Start Time: 1000 End Time: 1040 Facilitators: Londell Moh, NT  Department[facility]  Number of Participants: 10  Group Focus: acceptance, check in, and clarity of thought Treatment Modality:  Psychoeducation Interventions utilized were exploration and patient education Purpose: express feelings, increase insight, regain self-worth, and reinforce self-care  Name: Nicklaus Alviar Date of Birth: 09-12-1965  MR: [id number]    Level of Participation: active Quality of Participation: attentive Interactions with others: gave feedback Mood/Affect: appropriate Triggers (if applicable): n/a Cognition: coherent/clear Progress: Moderate Response: Pt was active during group. Plan: patient will be encouraged to attend future groups.  Patients Problems:  Patient Active Problem List   Diagnosis Date Noted   Cocaine abuse with cocaine-induced mood disorder (HCC) 06/27/2023   Alcohol use disorder, severe, in controlled environment (HCC) 06/27/2023   MDD (major depressive disorder), recurrent episode, severe (HCC) 06/27/2023   Substance abuse (HCC) 06/23/2023   Acute respiratory failure with hypoxia (HCC) 05/06/2021   Acute pulmonary edema (HCC) 05/06/2021   Hypertensive emergency

## 2023-06-28 NOTE — ED Notes (Signed)
Pt was provided breakfast.

## 2023-06-28 NOTE — ED Notes (Addendum)
PRN Tylenol administered due to patient reported headache rating 4/10. Medication administered with no complications. Environment secured, safety checks in place per facility policy.

## 2023-06-28 NOTE — ED Notes (Signed)
Patient is sleeping. Respirations equal and unlabored, skin warm and dry. No change in assessment or acuity. Routine safety checks conducted according to facility protocol. Will continue to monitor for safety.

## 2023-06-28 NOTE — ED Notes (Signed)
Patient sitting in dayroom interacting with peers. No acute distress noted. No concerns voiced. Informed patient to notify staff with any needs or assistance. Patient verbalized understanding or agreement. Safety checks in place per facility policy.

## 2023-06-28 NOTE — ED Notes (Signed)
Patient was seen sitting in the dining area watching TV without any distress noted. He is wearing personal clothing and he is calm and cooperative and currently denies SIHI, AVH, and anxiety and depression. He also denies withdrawal symptoms and reports sleeping good without any nightmares. Staff will continue to monitor safety and changes in his condition.

## 2023-06-28 NOTE — ED Provider Notes (Signed)
Behavioral Health Progress Note  Date and Time: 06/28/2023 3:24 PM Name: Joseph Reese MRN:  [id number]  HPI Joseph Reese is a 58 y.o. male with PMHx of stimulant use disorder-cocaine, alcohol use disorder, systolic heart failure, dyslipidemia, and hypertension being admitted to [facility] for detox from alcohol and cocaine as well as seeking residential rehab.  He was admitted to the facility based crisis unit on 06/23/2023  Subjective  The patient was observed sitting in the day room, watching television and interacting with other patients. When asked to return to his room for assessment, he responded politely and complied without hesitation. His gait was steady while ambulating.  The patient reports feeling much better since admission, noting an improvement in mood. He endorses sleeping 7-8 hours per night and states that his appetite has also increased. He attributes these improvements to abstaining from alcohol. He expresses a strong desire to "get his life together and improve his mental and physical health."    PHQ9= 3 (somewhat difficult)   He denies any withdrawal symptoms at this time but reports waking up with a headache this morning. The patient states that during previous visits, his withdrawal symptoms included sweating, nausea, vomiting, abdominal pain, and tremors.   [name] appears in no acute distress. He is alert, oriented 4, calm, cooperative, and attentive. His mood is euthymic with a congruent affect. His speech is normal, and his behavior is appropriate. His thought process is logical, goal-directed, and coherent, with no evidence of distractibility or preoccupation. There are no signs of psychosis, mania, or delusional thinking. He denies suicidal or homicidal ideation, self-harm, psychosis, and paranoia. His insight and judgment are appropriate. Overall, he appears stable and motivated for treatment.   His primary goal is to enter inpatient rehabilitation. The patient states  that he was provided with contact information for [facility]. He attempted to call yesterday but did not receive a response; he plans to call again today. He was encouraged to follow up, and he was observed using the telephone after the assessment.   He was also encouraged to participate in group sessions to maximize the benefits of his time at [facility].   Diagnosis:  Final diagnoses:  None         Sleep: Fair  Appetite:  Fair  Current Medications:  Current Facility-Administered Medications  Medication Dose Route Frequency Provider Last Rate Last Admin   acetaminophen (TYLENOL) tablet 650 mg  650 mg Oral Q6H PRN Maryagnes Amos, FNP   650 mg at 06/28/23 1500   alum & mag hydroxide-simeth (MAALOX/MYLANTA) 200-200-20 MG/5ML suspension 30 mL  30 mL Oral Q4H PRN Starkes-Perry, Juel Burrow, FNP       atorvastatin (LIPITOR) tablet 80 mg  80 mg Oral Daily Maryagnes Amos, FNP   80 mg at 06/28/23 1040   carvedilol (COREG) tablet 12.5 mg  12.5 mg Oral BID WC Maryagnes Amos, FNP   12.5 mg at 06/28/23 0836   haloperidol lactate (HALDOL) injection 5 mg  5 mg Intramuscular TID PRN Maryagnes Amos, FNP       And   diphenhydrAMINE (BENADRYL) injection 50 mg  50 mg Intramuscular TID PRN Starkes-Perry, Juel Burrow, FNP       And   LORazepam (ATIVAN) injection 2 mg  2 mg Intramuscular TID PRN Maryagnes Amos, FNP       empagliflozin (JARDIANCE) tablet 10 mg  10 mg Oral QAC breakfast Maryagnes Amos, FNP   10 mg at 06/28/23 [phone]  furosemide (LASIX) tablet 40 mg  40 mg Oral PRN Starkes-Perry, Juel Burrow, FNP       hydrOXYzine (ATARAX) tablet 25 mg  25 mg Oral Q6H PRN Starkes-Perry, Juel Burrow, FNP       lidocaine (LIDODERM) 5 % 1 patch  1 patch Transdermal Q24H Maryagnes Amos, FNP   1 patch at 06/27/23 1625   magnesium hydroxide (MILK OF MAGNESIA) suspension 30 mL  30 mL Oral Daily PRN Maryagnes Amos, FNP       naltrexone (DEPADE) tablet 50 mg   50 mg Oral QHS Maryagnes Amos, FNP   50 mg at 06/27/23 2155   sacubitril-valsartan (ENTRESTO) 24-26 mg per tablet  1 tablet Oral BID Maryagnes Amos, FNP   1 tablet at 06/28/23 1040   traZODone (DESYREL) tablet 50 mg  50 mg Oral QHS PRN Maryagnes Amos, FNP   50 mg at 06/27/23 2155   Current Outpatient Medications  Medication Sig Dispense Refill   atorvastatin (LIPITOR) 80 MG tablet Take 1 tablet (80 mg total) by mouth daily. 30 tablet 0   carvedilol (COREG) 12.5 MG tablet Take 1 tablet (12.5 mg total) by mouth 2 (two) times daily with a meal. 30 tablet 0   empagliflozin (JARDIANCE) 10 MG TABS tablet Take 1 tablet (10 mg total) by mouth daily before breakfast. 30 tablet 0   furosemide (LASIX) 40 MG tablet Take 1 tablet (40 mg total) by mouth as needed for fluid or edema. 30 tablet 0   hydrOXYzine (ATARAX) 25 MG tablet Take 1 tablet (25 mg total) by mouth every 6 (six) hours as needed (anxiety/agitation or CIWA < or = 10). 30 tablet 0   lidocaine (LIDODERM) 5 % Place 1 patch onto the skin daily. Remove & Discard patch within 12 hours or as directed by MD 30 patch 0   naltrexone (DEPADE) 50 MG tablet Take 1 tablet (50 mg total) by mouth at bedtime. 30 tablet 0   sacubitril-valsartan (ENTRESTO) 24-26 MG Take 1 tablet by mouth 2 (two) times daily. 30 tablet 0   spironolactone (ALDACTONE) 25 MG tablet Take 1 tablet (25 mg total) by mouth daily. 30 tablet 0   traZODone (DESYREL) 50 MG tablet Take 1 tablet (50 mg total) by mouth at bedtime as needed for sleep. 30 tablet 0    Labs  Lab Results:  Admission on 06/27/2023  Component Date Value Ref Range Status   POC Amphetamine UR 06/27/2023 None Detected  NONE DETECTED (Cut Off Level 1000 ng/mL) Final   POC Secobarbital (BAR) 06/27/2023 None Detected  NONE DETECTED (Cut Off Level 300 ng/mL) Final   POC Buprenorphine (BUP) 06/27/2023 None Detected  NONE DETECTED (Cut Off Level 10 ng/mL) Final   POC Oxazepam (BZO) 06/27/2023 None  Detected  NONE DETECTED (Cut Off Level 300 ng/mL) Final   POC Cocaine UR 06/27/2023 None Detected  NONE DETECTED (Cut Off Level 300 ng/mL) Final   POC Methamphetamine UR 06/27/2023 None Detected  NONE DETECTED (Cut Off Level 1000 ng/mL) Final   POC Morphine 06/27/2023 None Detected  NONE DETECTED (Cut Off Level 300 ng/mL) Final   POC Methadone UR 06/27/2023 None Detected  NONE DETECTED (Cut Off Level 300 ng/mL) Final   POC Oxycodone UR 06/27/2023 None Detected  NONE DETECTED (Cut Off Level 100 ng/mL) Final   POC Marijuana UR 06/27/2023 Positive (A)  NONE DETECTED (Cut Off Level 50 ng/mL) Final  Admission on 06/23/2023, Discharged on 06/27/2023  Component Date Value Ref Range  Status   WBC 06/23/2023 4.4  4.0 - 10.5 K/uL Final   RBC 06/23/2023 4.93  4.22 - 5.81 MIL/uL Final   Hemoglobin 06/23/2023 14.2  13.0 - 17.0 g/dL Final   HCT 16/02/9603 41.6  39.0 - 52.0 % Final   MCV 06/23/2023 84.4  80.0 - 100.0 fL Final   MCH 06/23/2023 28.8  26.0 - 34.0 pg Final   MCHC 06/23/2023 34.1  30.0 - 36.0 g/dL Final   RDW 54/01/8118 14.2  11.5 - 15.5 % Final   Platelets 06/23/2023 224  150 - 400 K/uL Final   nRBC 06/23/2023 0.0  0.0 - 0.2 % Final   Neutrophils Relative % 06/23/2023 38  % Final   Neutro Abs 06/23/2023 1.7  1.7 - 7.7 K/uL Final   Lymphocytes Relative 06/23/2023 43  % Final   Lymphs Abs 06/23/2023 1.9  0.7 - 4.0 K/uL Final   Monocytes Relative 06/23/2023 12  % Final   Monocytes Absolute 06/23/2023 0.5  0.1 - 1.0 K/uL Final   Eosinophils Relative 06/23/2023 6  % Final   Eosinophils Absolute 06/23/2023 0.3  0.0 - 0.5 K/uL Final   Basophils Relative 06/23/2023 0  % Final   Basophils Absolute 06/23/2023 0.0  0.0 - 0.1 K/uL Final   Immature Granulocytes 06/23/2023 1  % Final   Abs Immature Granulocytes 06/23/2023 0.02  0.00 - 0.07 K/uL Final   Performed at [facility], [address], [place], [place] [address]   Sodium 06/23/2023 139  135 - 145 mmol/L Final   Potassium 06/23/2023 3.7   3.5 - 5.1 mmol/L Final   Chloride 06/23/2023 107  98 - 111 mmol/L Final   CO2 06/23/2023 24  22 - 32 mmol/L Final   Glucose, Bld 06/23/2023 112 (H)  70 - 99 mg/dL Final   Glucose reference range applies only to samples taken after fasting for at least 8 hours.   BUN 06/23/2023 9  6 - 20 mg/dL Final   Creatinine, Ser 06/23/2023 0.88  0.61 - 1.24 mg/dL Final   Calcium 95/62/1308 9.3  8.9 - 10.3 mg/dL Final   Total Protein 65/78/4696 6.8  6.5 - 8.1 g/dL Final   Albumin 29/52/8413 3.6  3.5 - 5.0 g/dL Final   AST 24/40/1027 22  15 - 41 U/L Final   ALT 06/23/2023 16  0 - 44 U/L Final   Alkaline Phosphatase 06/23/2023 54  38 - 126 U/L Final   Total Bilirubin 06/23/2023 0.6  0.0 - 1.2 mg/dL Final   GFR, Estimated 06/23/2023 >60  >60 mL/min Final   Comment: (NOTE) Calculated using the CKD-EPI Creatinine Equation (2021)    Anion gap 06/23/2023 8  5 - 15 Final   Performed at [facility], [address] [place], [place] [address]   Hgb A1c MFr Bld 06/23/2023 5.1  4.8 - 5.6 % Final   Comment: (NOTE) Pre diabetes:          5.7%-6.4%  Diabetes:              >6.4%  Glycemic control for   <7.0% adults with diabetes    Mean Plasma Glucose 06/23/2023 99.67  mg/dL Final   Performed at [facility], [address], [place], [place] [address]   Alcohol, Ethyl (B) 06/23/2023 <10  <10 mg/dL Final   Comment: (NOTE) Lowest detectable limit for serum alcohol is 10 mg/dL.  For medical purposes only. Performed at [facility], [address] [place], [place] [address]    POC Amphetamine UR 06/23/2023 None Detected  NONE DETECTED (Cut Off Level 1000 ng/mL) Final   POC Secobarbital (BAR) 06/23/2023 None Detected  NONE DETECTED (Cut Off Level 300 ng/mL) Final   POC Buprenorphine (BUP) 06/23/2023 None Detected  NONE DETECTED (Cut Off Level 10 ng/mL) Final   POC Oxazepam (BZO) 06/23/2023 None Detected  NONE DETECTED (Cut Off Level 300 ng/mL) Final   POC Cocaine UR 06/23/2023 Positive  (A)  NONE DETECTED (Cut Off Level 300 ng/mL) Final   POC Methamphetamine UR 06/23/2023 None Detected  NONE DETECTED (Cut Off Level 1000 ng/mL) Final   POC Morphine 06/23/2023 None Detected  NONE DETECTED (Cut Off Level 300 ng/mL) Final   POC Methadone UR 06/23/2023 None Detected  NONE DETECTED (Cut Off Level 300 ng/mL) Final   POC Oxycodone UR 06/23/2023 None Detected  NONE DETECTED (Cut Off Level 100 ng/mL) Final   POC Marijuana UR 06/23/2023 Positive (A)  NONE DETECTED (Cut Off Level 50 ng/mL) Final   Cholesterol 06/23/2023 192  0 - 200 mg/dL Final   Triglycerides 60/45/4098 46  <150 mg/dL Final   HDL 11/91/4782 76  >40 mg/dL Final   Total CHOL/HDL Ratio 06/23/2023 2.5  RATIO Final   VLDL 06/23/2023 9  0 - 40 mg/dL Final   LDL Cholesterol 06/23/2023 107 (H)  0 - 99 mg/dL Final   Comment:        Total Cholesterol/HDL:CHD Risk Coronary Heart Disease Risk Table                     Men   Women  1/2 Average Risk   3.4   3.3  Average Risk       5.0   4.4  2 X Average Risk   9.6   7.1  3 X Average Risk  23.4   11.0        Use the calculated Patient Ratio above and the CHD Risk Table to determine the patient's CHD Risk.        ATP III CLASSIFICATION (LDL):  <100     mg/dL   Optimal  956-213  mg/dL   Near or Above                    Optimal  130-159  mg/dL   Borderline  086-578  mg/dL   High  >469     mg/dL   Very High Performed at [facility], [address] [place], [place] [address]    TSH 06/23/2023 1.229  0.350 - 4.500 uIU/mL Final   Comment: Performed by a 3rd Generation assay with a functional sensitivity of <=0.01 uIU/mL. Performed at [facility], [address], [place], [place] [address]   Admission on 06/20/2023, Discharged on 06/20/2023  Component Date Value Ref Range Status   Sodium 06/20/2023 134 (L)  135 - 145 mmol/L Final   Potassium 06/20/2023 3.9  3.5 - 5.1 mmol/L Final   Chloride 06/20/2023 100  98 - 111 mmol/L Final   CO2 06/20/2023 22  22 - 32  mmol/L Final   Glucose, Bld 06/20/2023 77  70 - 99 mg/dL Final   Glucose reference range applies only to samples taken after fasting for at least 8 hours.   BUN 06/20/2023 8  6 - 20 mg/dL Final   Creatinine, Ser 06/20/2023 0.85  0.61 - 1.24 mg/dL Final   Calcium 44/05/270 9.0  8.9 - 10.3 mg/dL  Final   GFR, Estimated 06/20/2023 >60  >60 mL/min Final   Comment: (NOTE) Calculated using the CKD-EPI Creatinine Equation (2021)    Anion gap 06/20/2023 12  5 - 15 Final   Performed at [facility], [address] [place], [place] [address]   WBC 06/20/2023 5.0  4.0 - 10.5 K/uL Final   RBC 06/20/2023 5.19  4.22 - 5.81 MIL/uL Final   Hemoglobin 06/20/2023 15.2  13.0 - 17.0 g/dL Final   HCT 60/45/4098 43.9  39.0 - 52.0 % Final   MCV 06/20/2023 84.6  80.0 - 100.0 fL Final   MCH 06/20/2023 29.3  26.0 - 34.0 pg Final   MCHC 06/20/2023 34.6  30.0 - 36.0 g/dL Final   RDW 11/91/4782 14.1  11.5 - 15.5 % Final   Platelets 06/20/2023 239  150 - 400 K/uL Final   nRBC 06/20/2023 0.0  0.0 - 0.2 % Final   Performed at [facility], [address], [place], [place] [address]   Troponin I (High Sensitivity) 06/20/2023 21 (H)  <18 ng/L Final   Comment: (NOTE) Elevated high sensitivity troponin I (hsTnI) values and significant  changes across serial measurements may suggest ACS but many other  chronic and acute conditions are known to elevate hsTnI results.  Refer to the "Links" section for chest pain algorithms and additional  guidance. Performed at [facility], [address], [place], [place] [address]    B Natriuretic Peptide 06/20/2023 54.9  0.0 - 100.0 pg/mL Final   Performed at [facility], [address], [place], [place] [address]   Troponin I (High Sensitivity) 06/20/2023 19 (H)  <18 ng/L Final   Comment: (NOTE) Elevated high sensitivity troponin I (hsTnI) values and significant  changes across serial measurements may suggest ACS but many other  chronic and acute  conditions are known to elevate hsTnI results.  Refer to the "Links" section for chest pain algorithms and additional  guidance. Performed at [facility], [address], [place], [place] [address]   Admission on 06/17/2023, Discharged on 06/18/2023  Component Date Value Ref Range Status   Sodium 06/17/2023 137  135 - 145 mmol/L Final   Potassium 06/17/2023 3.4 (L)  3.5 - 5.1 mmol/L Final   Chloride 06/17/2023 102  98 - 111 mmol/L Final   CO2 06/17/2023 23  22 - 32 mmol/L Final   Glucose, Bld 06/17/2023 84  70 - 99 mg/dL Final   Glucose reference range applies only to samples taken after fasting for at least 8 hours.   BUN 06/17/2023 5 (L)  6 - 20 mg/dL Final   Creatinine, Ser 06/17/2023 1.01  0.61 - 1.24 mg/dL Final   Calcium 84/13/2440 8.9  8.9 - 10.3 mg/dL Final   GFR, Estimated 06/17/2023 >60  >60 mL/min Final   Comment: (NOTE) Calculated using the CKD-EPI Creatinine Equation (2021)    Anion gap 06/17/2023 12  5 - 15 Final   Performed at [facility], [address], [place], [place] [address]   WBC 06/17/2023 5.0  4.0 - 10.5 K/uL Final   RBC 06/17/2023 4.63  4.22 - 5.81 MIL/uL Final   Hemoglobin 06/17/2023 13.4  13.0 - 17.0 g/dL Final   HCT 53/66/4403 39.7  39.0 - 52.0 % Final   MCV 06/17/2023 85.7  80.0 - 100.0 fL Final   MCH 06/17/2023 28.9  26.0 - 34.0 pg Final   MCHC 06/17/2023 33.8  30.0 -  36.0 g/dL Final   RDW 16/02/9603 14.6  11.5 - 15.5 % Final   Platelets 06/17/2023 234  150 - 400 K/uL Final   nRBC 06/17/2023 0.0  0.0 - 0.2 % Final   Performed at [facility], [address], [place], [place] [address]   Troponin I (High Sensitivity) 06/17/2023 36 (H)  <18 ng/L Final   Comment: (NOTE) Elevated high sensitivity troponin I (hsTnI) values and significant  changes across serial measurements may suggest ACS but many other  chronic and acute conditions are known to elevate hsTnI results.  Refer to the "Links" section for chest pain algorithms and  additional  guidance. Performed at [facility], [address] [place], [place] [address]    B Natriuretic Peptide 06/17/2023 185.1 (H)  0.0 - 100.0 pg/mL Final   Performed at [facility], [address], [place], [place] [address]   SARS Coronavirus 2 by RT PCR 06/17/2023 NEGATIVE  NEGATIVE Final   Influenza A by PCR 06/17/2023 NEGATIVE  NEGATIVE Final   Influenza B by PCR 06/17/2023 NEGATIVE  NEGATIVE Final   Comment: (NOTE) The Xpert Xpress SARS-CoV-2/FLU/RSV plus assay is intended as an aid in the diagnosis of influenza from Nasopharyngeal swab specimens and should not be used as a sole basis for treatment. Nasal washings and aspirates are unacceptable for Xpert Xpress SARS-CoV-2/FLU/RSV testing.  Fact Sheet for Patients: BloggerCourse.com  Fact Sheet for Healthcare Providers: SeriousBroker.it  This test is not yet approved or cleared by the [place] FDA and has been authorized for detection and/or diagnosis of SARS-CoV-2 by FDA under an Emergency Use Authorization (EUA). This EUA will remain in effect (meaning this test can be used) for the duration of the COVID-19 declaration under Section 564(b)(1) of the Act, 21 U.S.C. section 360bbb-3(b)(1), unless the authorization is terminated or revoked.     Resp Syncytial Virus by PCR 06/17/2023 NEGATIVE  NEGATIVE Final   Comment: (NOTE) Fact Sheet for Patients: BloggerCourse.com  Fact Sheet for Healthcare Providers: SeriousBroker.it  This test is not yet approved or cleared by the [place] FDA and has been authorized for detection and/or diagnosis of SARS-CoV-2 by FDA under an Emergency Use Authorization (EUA). This EUA will remain in effect (meaning this test can be used) for the duration of the COVID-19 declaration under Section 564(b)(1) of the Act, 21 U.S.C. section 360bbb-3(b)(1), unless the  authorization is terminated or revoked.  Performed at [facility], [address] [place], [place] [address]    Troponin I (High Sensitivity) 06/17/2023 37 (H)  <18 ng/L Final   Comment: (NOTE) Elevated high sensitivity troponin I (hsTnI) values and significant  changes across serial measurements may suggest ACS but many other  chronic and acute conditions are known to elevate hsTnI results.  Refer to the "Links" section for chest pain algorithms and additional  guidance. Performed at [facility], [address] [place], [place] [address]   Admission on 01/18/2023, Discharged on 01/18/2023  Component Date Value Ref Range Status   Cath EF Quantitative 01/18/2023 25  % Final   Glucose-Capillary 01/18/2023 94  70 - 99 mg/dL Final   Glucose reference range applies only to samples taken after fasting for at least 8 hours.   Comment 1 01/18/2023 Notify RN   Final   Comment 2 01/18/2023 Document in Chart   Final   pH, Ven 01/18/2023 7.355  7.25 - 7.43 Final   pCO2, Ven 01/18/2023 42.9 (L)  44 - 60 mmHg Final   pO2, Ven 01/18/2023 47 (H)  32 - 45 mmHg Final   Bicarbonate 01/18/2023 23.9  20.0 - 28.0 mmol/L Final   TCO2 01/18/2023 25  22 - 32 mmol/L Final   O2 Saturation 01/18/2023 80  % Final   Acid-base deficit 01/18/2023 2.0  0.0 - 2.0 mmol/L Final   Sodium 01/18/2023 143  135 - 145 mmol/L Final   Potassium 01/18/2023 3.3 (L)  3.5 - 5.1 mmol/L Final   Calcium, Ion 01/18/2023 1.13 (L)  1.15 - 1.40 mmol/L Final   HCT 01/18/2023 41.0  39.0 - 52.0 % Final   Hemoglobin 01/18/2023 13.9  13.0 - 17.0 g/dL Final   Sample type 16/02/9603 VENOUS   Final   pH, Ven 01/18/2023 7.350  7.25 - 7.43 Final   pCO2, Ven 01/18/2023 45.2  44 - 60 mmHg Final   pO2, Ven 01/18/2023 44  32 - 45 mmHg Final   Bicarbonate 01/18/2023 24.9  20.0 - 28.0 mmol/L Final   TCO2 01/18/2023 26  22 - 32 mmol/L Final   O2 Saturation 01/18/2023 77  % Final   Acid-base deficit 01/18/2023 1.0  0.0 - 2.0  mmol/L Final   Sodium 01/18/2023 141  135 - 145 mmol/L Final   Potassium 01/18/2023 3.5  3.5 - 5.1 mmol/L Final   Calcium, Ion 01/18/2023 1.20  1.15 - 1.40 mmol/L Final   HCT 01/18/2023 42.0  39.0 - 52.0 % Final   Hemoglobin 01/18/2023 14.3  13.0 - 17.0 g/dL Final   Sample type 54/01/8118 VENOUS   Final   pH, Arterial 01/18/2023 7.357  7.35 - 7.45 Final   pCO2 arterial 01/18/2023 37.3  32 - 48 mmHg Final   pO2, Arterial 01/18/2023 138 (H)  83 - 108 mmHg Final   Bicarbonate 01/18/2023 20.9  20.0 - 28.0 mmol/L Final   TCO2 01/18/2023 22  22 - 32 mmol/L Final   O2 Saturation 01/18/2023 99  % Final   Acid-base deficit 01/18/2023 4.0 (H)  0.0 - 2.0 mmol/L Final   Sodium 01/18/2023 147 (H)  135 - 145 mmol/L Final   Potassium 01/18/2023 2.9 (L)  3.5 - 5.1 mmol/L Final   Calcium, Ion 01/18/2023 0.96 (L)  1.15 - 1.40 mmol/L Final   HCT 01/18/2023 38.0 (L)  39.0 - 52.0 % Final   Hemoglobin 01/18/2023 12.9 (L)  13.0 - 17.0 g/dL Final   Sample type 14/78/2956 ARTERIAL   Final   pH, Ven 01/18/2023 7.347  7.25 - 7.43 Final   pCO2, Ven 01/18/2023 45.2  44 - 60 mmHg Final   pO2, Ven 01/18/2023 48 (H)  32 - 45 mmHg Final   Bicarbonate 01/18/2023 24.8  20.0 - 28.0 mmol/L Final   TCO2 01/18/2023 26  22 - 32 mmol/L Final   O2 Saturation 01/18/2023 81  % Final   Acid-base deficit 01/18/2023 1.0  0.0 - 2.0 mmol/L Final   Sodium 01/18/2023 141  135 - 145 mmol/L Final   Potassium 01/18/2023 3.6  3.5 - 5.1 mmol/L Final   Calcium, Ion 01/18/2023 1.31  1.15 - 1.40 mmol/L Final   HCT 01/18/2023 44.0  39.0 - 52.0 % Final   Hemoglobin 01/18/2023 15.0  13.0 - 17.0 g/dL Final   Sample type 21/30/8657 VENOUS   Final  Hospital Outpatient Visit on 01/02/2023  Component Date Value Ref Range Status   Sodium 01/02/2023 140  135 - 145 mmol/L Final   Potassium 01/02/2023 4.1  3.5 - 5.1 mmol/L  Final   Chloride 01/02/2023 98  98 - 111 mmol/L Final   CO2 01/02/2023 28  22 - 32 mmol/L Final   Glucose, Bld 01/02/2023 91   70 - 99 mg/dL Final   Glucose reference range applies only to samples taken after fasting for at least 8 hours.   BUN 01/02/2023 11  6 - 20 mg/dL Final   Creatinine, Ser 01/02/2023 0.87  0.61 - 1.24 mg/dL Final   Calcium 62/95/2841 10.0  8.9 - 10.3 mg/dL Final   GFR, Estimated 01/02/2023 >60  >60 mL/min Final   Comment: (NOTE) Calculated using the CKD-EPI Creatinine Equation (2021)    Anion gap 01/02/2023 14  5 - 15 Final   Performed at [facility], [address], [place], [place] [address]   WBC 01/02/2023 6.0  4.0 - 10.5 K/uL Final   RBC 01/02/2023 5.54  4.22 - 5.81 MIL/uL Final   Hemoglobin 01/02/2023 15.8  13.0 - 17.0 g/dL Final   HCT 03/10/2535 45.8  39.0 - 52.0 % Final   MCV 01/02/2023 82.7  80.0 - 100.0 fL Final   MCH 01/02/2023 28.5  26.0 - 34.0 pg Final   MCHC 01/02/2023 34.5  30.0 - 36.0 g/dL Final   RDW 64/40/3474 13.5  11.5 - 15.5 % Final   Platelets 01/02/2023 243  150 - 400 K/uL Final   nRBC 01/02/2023 0.0  0.0 - 0.2 % Final   Performed at [facility], [address], [place], [place] [address]   B Natriuretic Peptide 01/02/2023 62.5  0.0 - 100.0 pg/mL Final   Performed at [facility], [address], [place], [place] [address]  Hospital Outpatient Visit on 01/02/2023  Component Date Value Ref Range Status   S' Lateral 01/02/2023 5.70  cm Final   AV Area VTI 01/02/2023 1.50  cm2 Final   AV Mean grad 01/02/2023 3.5  mmHg Final   Single Plane A4C EF 01/02/2023 31.2  % Final   Single Plane A2C EF 01/02/2023 30.6  % Final   Calc EF 01/02/2023 30.0  % Final   AV Area mean vel 01/02/2023 1.61  cm2 Final   Area-P 1/2 01/02/2023 15.80  cm2 Final   AR max vel 01/02/2023 1.65  cm2 Final   AV Peak grad 01/02/2023 6.3  mmHg Final   Ao pk vel 01/02/2023 1.26  m/s Final   MV M vel 01/02/2023 1.49  m/s Final   MV Peak grad 01/02/2023 8.8  mmHg Final   P 1/2 time 01/02/2023 1,298  msec Final   Est EF 01/02/2023 25 - 30%   Final    Blood Alcohol level:   Lab Results  Component Value Date   ETH <10 06/23/2023    Metabolic Disorder Labs: Lab Results  Component Value Date   HGBA1C 5.1 06/23/2023   MPG 99.67 06/23/2023   MPG 88.19 06/04/2022   No results found for: "PROLACTIN" Lab Results  Component Value Date   CHOL 192 06/23/2023   TRIG 46 06/23/2023   HDL 76 06/23/2023   CHOLHDL 2.5 06/23/2023   VLDL 9 06/23/2023   LDLCALC 107 (H) 06/23/2023   LDLCALC 104 (H) 06/30/2021     Physical Findings   AUDIT    Flowsheet Row ED to Hosp-Admission (Discharged) from 05/06/2021 in [place] [facility]  Alcohol Use Disorder Identification Test Final Score (AUDIT) 6      CAGE-AID    Flowsheet Row ED to Hosp-Admission (Discharged) from  05/06/2021 in [place] [facility]  CAGE-AID Score 3      GAD-7    Flowsheet Row Office Visit from 05/11/2021 in [facility] [place] - A Dept Of Eligha Brideg[facility]. [facility]  Total GAD-7 Score 1      PHQ2-9    Flowsheet Row ED from 06/23/2023 in [facility] Most recent reading at 06/27/2023 10:32 AM ED from 06/23/2023 in [facility] Most recent reading at 06/23/2023  4:18 AM Office Visit from 05/11/2021 in [facility] - A Dept Of [place]. [facility] Most recent reading at 05/11/2021  9:36 AM  PHQ-2 Total Score 0 6 0  PHQ-9 Total Score 0 19 2      Flowsheet Row ED from 06/27/2023 in [facility] Most recent reading at 06/27/2023  3:58 PM ED from 06/23/2023 in [facility] Most recent reading at 06/26/2023  9:47 AM ED from 06/23/2023 in [facility] Most recent reading at 06/23/2023  4:18 AM  C-SSRS RISK CATEGORY No Risk No Risk Low Risk        Musculoskeletal  Strength & Muscle Tone: within normal limits Gait & Station: normal Patient leans: N/A  Psychiatric Specialty Exam  Presentation   General Appearance:  Appropriate for Environment; Casual  Eye Contact: Good  Speech: Spontaneous with normal rate and volume  Speech Volume: Normal  Handedness: Right   Mood and Affect  Mood: Fine  Affect: Somewhat constricted   Thought Process  Thought Processes: Coherent; Linear  Descriptions of Associations:Intact  Orientation:Full (Time, Place and Person)  Thought Content:WDL  Diagnosis of Schizophrenia or Schizoaffective disorder in past: No    Hallucinations:Hallucinations: None  Ideas of Reference:None  Suicidal Thoughts:Suicidal Thoughts: No  Homicidal Thoughts:Homicidal Thoughts: No   Sensorium  Memory: Immediate Good; Recent Fair; Remote Fair  Judgment: Limited  Insight: Good   Executive Functions  Concentration: Good  Attention Span: Good  Recall: Good  Fund of Knowledge: Good  Language: Good   Psychomotor Activity  Psychomotor Activity: Psychomotor Activity: Normal AIMS Completed?: No   Assets  Assets: Communication Skills; Desire for Improvement   Sleep  Sleep: Sleep: Good     Physical Exam  Physical Exam Vitals and nursing note reviewed.  Constitutional:      Appearance: Normal appearance.  HENT:     Head: Normocephalic and atraumatic.     Nose: No congestion.  Eyes:     Pupils: Pupils are equal, round, and reactive to light.  Cardiovascular:     Rate and Rhythm: Regular rhythm.     Heart sounds: Normal heart sounds.  Pulmonary:     Effort: Pulmonary effort is normal.  Skin:    General: Skin is warm.  Neurological:     General: No focal deficit present.     Mental Status: He is alert and oriented to person, place, and time. Mental status is at baseline.  Psychiatric:        Mood and Affect: Mood normal.        Behavior: Behavior normal.        Thought Content: Thought content normal.        Judgment: Judgment normal.    Review of Systems  Constitutional:  Negative for chills and  fever.  HENT:  Negative for hearing loss and sore throat.   Eyes:  Negative for blurred vision.  Respiratory:  Negative for  cough and shortness of breath.   Cardiovascular:  Negative for chest pain and palpitations.  Gastrointestinal:  Negative for nausea and vomiting.  Neurological:  Negative for dizziness and speech change.  Psychiatric/Behavioral:  Positive for substance abuse. Negative for depression and suicidal ideas.   All other systems reviewed and are negative.  Blood pressure (!) 140/80, pulse 60, temperature 98.4 F (36.9 C), temperature source Oral, resp. rate 18, SpO2 98%. There is no height or weight on file to calculate BMI.  Treatment Plan Summary: Daily contact with patient to assess and evaluate symptoms and progress in treatment and Medication management  Alcohol use disorder Stimulant use disorder-cocaine -Advise cessation -continue CIWA -Continue Naltrexone 50 mg QHS  Chronic systolic heart failure Hypertension Dyslipidemia -Continue Lipitor 80 mg daily -Continue Coreg 12.5 mg twice daily with meals -Replace Farxiga with Jardiance 10 mg daily -Continue Lasix 40 mg as needed for fluid/edema -Continue Entresto 1 tablet twice daily -Continue Aldactone 25 mg daily   Disposition: Social work continues to seek placement in residential Rehab facility   Maryagnes Amos, FNP 06/28/2023 3:24 PM

## 2023-06-28 NOTE — Discharge Planning (Signed)
LCSW followed up with [facility] Admissions Coordinator Vonna Kotyk who reports bed will be available for patient on tomorrow 06/29/2023.Transportation has been arranged via [employer] bus ticket for 8:05am. Patient to be discharged in an adequate amount of time to make it to the bus on time. Bus ticket and taxi voucher has been placed in the patient's chart. Patient will only need a 30 day script at discharge. LCSW spoke with patient to confirm if he is in agreement with plan. Patient is agreeable to plan and expressed appreciation for LCSW assistance. No other needs were reported by patient or facility.   Fernande Boyden, LCSW Clinical Social Worker [facility] Ph: [phone]

## 2023-06-28 NOTE — ED Notes (Signed)
Pt was provided lunch

## 2023-06-29 ENCOUNTER — Other Ambulatory Visit: Payer: Self-pay

## 2023-06-29 DIAGNOSIS — I5022 Chronic systolic (congestive) heart failure: Secondary | ICD-10-CM | POA: Diagnosis not present

## 2023-06-29 DIAGNOSIS — I10 Essential (primary) hypertension: Secondary | ICD-10-CM

## 2023-06-29 DIAGNOSIS — E785 Hyperlipidemia, unspecified: Secondary | ICD-10-CM | POA: Diagnosis not present

## 2023-06-29 DIAGNOSIS — E119 Type 2 diabetes mellitus without complications: Secondary | ICD-10-CM

## 2023-06-29 DIAGNOSIS — F1910 Other psychoactive substance abuse, uncomplicated: Secondary | ICD-10-CM

## 2023-06-29 MED ORDER — TRAZODONE HCL 50 MG PO TABS: 50.0000 mg | ORAL_TABLET | Freq: Every evening | ORAL | 0 refills | Status: DC | PRN

## 2023-06-29 MED ORDER — ATORVASTATIN CALCIUM 80 MG PO TABS: 80.0000 mg | ORAL_TABLET | Freq: Every day | ORAL | 0 refills | Status: DC

## 2023-06-29 MED ORDER — EMPAGLIFLOZIN 10 MG PO TABS: 10.0000 mg | ORAL_TABLET | Freq: Every day | ORAL | 0 refills | Status: DC

## 2023-06-29 MED ORDER — LIDOCAINE 5 % EX PTCH: 1.0000 | MEDICATED_PATCH | CUTANEOUS | 0 refills | Status: DC

## 2023-06-29 MED ORDER — SACUBITRIL-VALSARTAN 24-26 MG PO TABS
1.0000 | ORAL_TABLET | Freq: Two times a day (BID) | ORAL | 0 refills | Status: DC
  Filled 2023-06-29: qty 60, 30d supply, fill #0

## 2023-06-29 MED ORDER — HYDROXYZINE HCL 25 MG PO TABS: 25.0000 mg | ORAL_TABLET | Freq: Four times a day (QID) | ORAL | 0 refills | Status: DC | PRN

## 2023-06-29 MED ORDER — FUROSEMIDE 40 MG PO TABS: 40.0000 mg | ORAL_TABLET | ORAL | 0 refills | Status: DC | PRN

## 2023-06-29 MED ORDER — CARVEDILOL 12.5 MG PO TABS: 12.5000 mg | ORAL_TABLET | Freq: Two times a day (BID) | ORAL | 0 refills | Status: DC

## 2023-06-29 MED ORDER — NALTREXONE HCL 50 MG PO TABS: 50.0000 mg | ORAL_TABLET | Freq: Every day | ORAL | 0 refills | Status: AC

## 2023-06-29 NOTE — Group Note (Signed)
Group Topic: Relapse and Recovery  Group Date: 06/29/2023 Start Time: 0735 End Time: 0740 Facilitators: Jolane Bankhead, Jacklynn Barnacle, RN  Department: [facility]  Number of Participants: 1  Group Focus: discharge education Treatment Modality:  Individual Therapy Interventions utilized were patient education Purpose: increase insight  Name: Joseph Reese Date of Birth: Mar 29, 1966  MR: [id number]    Level of Participation: active Quality of Participation: attentive Interactions with others: gave feedback Mood/Affect: appropriate Triggers (if applicable): None identified Cognition: coherent/clear Progress: Significant Response: Patient voiced understanding of all d/c instructions as explained by writer Plan: Patient d/c to [facility] to continue rehabilitation  Patients Problems:  Patient Active Problem List   Diagnosis Date Noted   Cocaine abuse with cocaine-induced mood disorder (HCC) 06/27/2023   Alcohol use disorder, severe, in controlled environment (HCC) 06/27/2023   MDD (major depressive disorder), recurrent episode, severe (HCC) 06/27/2023   Substance abuse (HCC) 06/23/2023   Acute respiratory failure with hypoxia (HCC) 05/06/2021   Acute pulmonary edema (HCC) 05/06/2021   Hypertensive emergency

## 2023-06-29 NOTE — ED Notes (Signed)
Patient alert & oriented x4. Denies intent to harm self or others when asked. Denies A/VH. Patient denies any physical complaints when asked. No acute distress noted. Patient has planned d/c to [facility] this AM. [facility] had been called to transport patient to bus depot for transport to [facility] via [employer] bus. [facility] stated they are unable to do so despite confirmation of transport with night shift Charity fundraiser. [facility] called and transport at the latest to allow patient to be at bus stop in time for arranged for transport at [phone]. Support and encouragement provided. Routine safety checks conducted per facility protocol. Encouraged patient to notify staff if any thoughts of harm towards self or others arise. Patient verbalizes understanding and agreement.

## 2023-06-29 NOTE — ED Notes (Signed)
Patient is calm and cooperative and has been visible on the unit without any distress noted. Patient was compliant with HS medications. Staff will continue to monitor safety and changes in condition.

## 2023-06-29 NOTE — ED Notes (Signed)
Patient is resting with eyes closed with even unlabored breathing. No s/s of distress or withdrawals. Staff will continue to monitor safety and changes in condition.

## 2023-06-29 NOTE — Group Note (Signed)
Group Topic: Change and Accountability  Group Date: 06/28/2023 Start Time: 1930 End Time: 2000 Facilitators: Debe Coder, NT  Department: [facility]  Number of Participants: 6  Group Focus: affirmation Treatment Modality:  Individual Therapy Interventions utilized were group exercise Purpose: explore maladaptive thinking and express feelings  Name: Joseph Reese Date of Birth: 20-Oct-1965  MR: [id number]    Level of Participation: active Quality of Participation: engaged Interactions with others: gave feedback Mood/Affect: appropriate Triggers (if applicable):  Cognition: coherent/clear Progress: Significant Response:  Plan: patient will be encouraged to   Patients Problems:  Patient Active Problem List   Diagnosis Date Noted   Cocaine abuse with cocaine-induced mood disorder (HCC) 06/27/2023   Alcohol use disorder, severe, in controlled environment (HCC) 06/27/2023   MDD (major depressive disorder), recurrent episode, severe (HCC) 06/27/2023   Substance abuse (HCC) 06/23/2023   Acute respiratory failure with hypoxia (HCC) 05/06/2021   Acute pulmonary edema (HCC) 05/06/2021   Hypertensive emergency

## 2023-06-29 NOTE — ED Provider Notes (Signed)
FBC/OBS ASAP Discharge Summary  Date and Time: 06/29/2023 7:45 AM  Name: Joseph Reese  MRN:  [id number]   Discharge Diagnoses:  Final diagnoses:  Chronic systolic heart failure (HCC)  Type 2 diabetes mellitus without complication, without long-term current use of insulin (HCC)  Polysubstance abuse (HCC)  Hyperlipidemia, unspecified hyperlipidemia type  Hypertension, unspecified type   Per Caryn Bee, DNP on 2/13:  Hpi: Joseph Reese is a 57 y.o. male with PMHx of stimulant use disorder-cocaine, alcohol use disorder, systolic heart failure, dyslipidemia, and hypertension being admitted to [facility] for detox from alcohol and cocaine as well as seeking residential rehab.  Patient's primary motivation for quitting alcohol and cocaine is that it is affecting his heart. Per [facility]:  "Patient was evaluated face-to-face and his chart was reviewed by this nurse practitioner on 06/23/23. On assessment, patient reports longstanding history of substance abuse. Patient says he has been struggling with alcohol and cocaine abuse for over 40 years. He reports daily consumption of approximately 12 cans of beer and smokes around 0.5 grams of crack cocaine 3-4 times per week. He says his last use of alcohol (2 cans of 24oz beers and 6-7 shots of liquor) and cocaine was approximately 4 hours ago. The patient also notes occasional marijuana use, last use was 4 days ago.   He says he is motivated to seek substance abuse treatment due to his declining health. He has a known history of cardiovascular disease (Heart failure); he reports that he is followed by [facility] due to the severity of his heart failure and acknowledges that his ongoing substance abuse is harmful to his health. He says that he is depressed due to his struggle with substance abuse, failing health, homelessness, and unemployment. He is endorsing depressive symptoms of isolation, restlessness, persistent feelings of guilt and  hopelessness. He admits to suicidal ideation however, he denies any plan or intent to harm himself. He also denies any homicidal ideation, paranoia, and hallucination.    During assessment patient is alert and oriented x 4.  He is calm and cooperative.  Patient is speaking in a clear tone, normal rate, volume, and pace with good eye contact.  Patient's mood is depressed with a congruent affect.  His thought process is coherent.  Patient's judgment is fair, insight is intact.  Objectively patient did not appear to be responding to any stimuli during this interaction."  Patient's longest period of sobriety was decades ago for approximately 3 years after he had gone to detox and residential rehab.  He denies feeling suicidal anymore and denies HI/AVH.  Subjective: Doing well ready to continue my treatment.   Stay Summary: Patient's care was discussed during the interdisciplinary team meeting every day during the hospitalization.The patient denies having side effects to prescribed psychiatric medication. Gradually, patient started adjusting to milieu. The patient was evaluated each day by a clinical provider to ascertain response to treatment. Improvement was noted by the patient's report of decreasing symptoms, improved sleep and appetite, affect, medication tolerance, behavior, and participation in unit programming.  Patient was asked each day to complete a self inventory noting mood, mental status, pain, new symptoms, anxiety and concerns.  Symptoms were reported as significantly decreased or resolved completely by discharge.    On day of discharge, the patient reports that their mood is stable. The patient denied having suicidal thoughts for more than 48 hours prior to discharge.  Patient denies having homicidal thoughts.  Patient denies having auditory hallucinations.  Patient  denies any visual hallucinations or other symptoms of psychosis. The patient was motivated to continue taking medication with a  goal of continued improvement in mental health.    The patient reported that their withdrawal symptoms and cravings responded well to the detox regimen, with overall benefit from the detox program. Supportive psychotherapy was provided, and the patient participated in regular group therapy sessions focused on managing cravings and withdrawal. Coping skills, problem-solving, and relaxation techniques were also part of the program's therapeutic interventions.     Total Time spent with patient: 20 minutes   Tobacco Cessation:  N/A, patient does not currently use tobacco products  Current Medications:  Current Facility-Administered Medications  Medication Dose Route Frequency Provider Last Rate Last Admin   acetaminophen (TYLENOL) tablet 650 mg  650 mg Oral Q6H PRN Maryagnes Amos, FNP   650 mg at 06/28/23 2235   alum & mag hydroxide-simeth (MAALOX/MYLANTA) 200-200-20 MG/5ML suspension 30 mL  30 mL Oral Q4H PRN Maryagnes Amos, FNP       atorvastatin (LIPITOR) tablet 80 mg  80 mg Oral Daily Maryagnes Amos, FNP   80 mg at 06/28/23 1040   carvedilol (COREG) tablet 12.5 mg  12.5 mg Oral BID WC Maryagnes Amos, FNP   12.5 mg at 06/28/23 1758   haloperidol lactate (HALDOL) injection 5 mg  5 mg Intramuscular TID PRN Maryagnes Amos, FNP       And   diphenhydrAMINE (BENADRYL) injection 50 mg  50 mg Intramuscular TID PRN Starkes-Perry, Juel Burrow, FNP       And   LORazepam (ATIVAN) injection 2 mg  2 mg Intramuscular TID PRN Starkes-Perry, Juel Burrow, FNP       empagliflozin (JARDIANCE) tablet 10 mg  10 mg Oral QAC breakfast Maryagnes Amos, FNP   10 mg at 06/28/23 0836   furosemide (LASIX) tablet 40 mg  40 mg Oral PRN Maryagnes Amos, FNP       hydrOXYzine (ATARAX) tablet 25 mg  25 mg Oral Q6H PRN Maryagnes Amos, FNP   25 mg at 06/28/23 2342   lidocaine (LIDODERM) 5 % 1 patch  1 patch Transdermal Q24H Maryagnes Amos, FNP   1 patch at 06/27/23  1625   magnesium hydroxide (MILK OF MAGNESIA) suspension 30 mL  30 mL Oral Daily PRN Maryagnes Amos, FNP       naltrexone (DEPADE) tablet 50 mg  50 mg Oral QHS Maryagnes Amos, FNP   50 mg at 06/28/23 2123   sacubitril-valsartan (ENTRESTO) 24-26 mg per tablet  1 tablet Oral BID Maryagnes Amos, FNP   1 tablet at 06/28/23 2325   traZODone (DESYREL) tablet 50 mg  50 mg Oral QHS PRN Maryagnes Amos, FNP   50 mg at 06/28/23 2342   Current Outpatient Medications  Medication Sig Dispense Refill   atorvastatin (LIPITOR) 80 MG tablet Take 1 tablet (80 mg total) by mouth daily. 30 tablet 0   carvedilol (COREG) 12.5 MG tablet Take 1 tablet (12.5 mg total) by mouth 2 (two) times daily with a meal. 60 tablet 0   empagliflozin (JARDIANCE) 10 MG TABS tablet Take 1 tablet (10 mg total) by mouth daily before breakfast. 30 tablet 0   furosemide (LASIX) 40 MG tablet Take 1 tablet (40 mg total) by mouth as needed for fluid or edema. 30 tablet 0   hydrOXYzine (ATARAX) 25 MG tablet Take 1 tablet (25 mg total) by mouth every 6 (six) hours  as needed for anxiety. 120 tablet 0   lidocaine (LIDODERM) 5 % Place 1 patch onto the skin daily. Remove & Discard patch within 12 hours or as directed by MD 30 patch 0   naltrexone (DEPADE) 50 MG tablet Take 1 tablet (50 mg total) by mouth at bedtime. 30 tablet 0   sacubitril-valsartan (ENTRESTO) 24-26 MG Take 1 tablet by mouth 2 (two) times daily. 60 tablet 0   traZODone (DESYREL) 50 MG tablet Take 1 tablet (50 mg total) by mouth at bedtime as needed for sleep. 30 tablet 0    PTA Medications:  Facility Ordered Medications  Medication   [COMPLETED] traZODone (DESYREL) tablet 50 mg   acetaminophen (TYLENOL) tablet 650 mg   alum & mag hydroxide-simeth (MAALOX/MYLANTA) 200-200-20 MG/5ML suspension 30 mL   magnesium hydroxide (MILK OF MAGNESIA) suspension 30 mL   haloperidol lactate (HALDOL) injection 5 mg   And   diphenhydrAMINE (BENADRYL) injection  50 mg   And   LORazepam (ATIVAN) injection 2 mg   atorvastatin (LIPITOR) tablet 80 mg   carvedilol (COREG) tablet 12.5 mg   empagliflozin (JARDIANCE) tablet 10 mg   furosemide (LASIX) tablet 40 mg   hydrOXYzine (ATARAX) tablet 25 mg   lidocaine (LIDODERM) 5 % 1 patch   naltrexone (DEPADE) tablet 50 mg   sacubitril-valsartan (ENTRESTO) 24-26 mg per tablet   traZODone (DESYREL) tablet 50 mg   PTA Medications  Medication Sig   atorvastatin (LIPITOR) 80 MG tablet Take 1 tablet (80 mg total) by mouth daily.   carvedilol (COREG) 12.5 MG tablet Take 1 tablet (12.5 mg total) by mouth 2 (two) times daily with a meal.   furosemide (LASIX) 40 MG tablet Take 1 tablet (40 mg total) by mouth as needed for fluid or edema.   sacubitril-valsartan (ENTRESTO) 24-26 MG Take 1 tablet by mouth 2 (two) times daily.   hydrOXYzine (ATARAX) 25 MG tablet Take 1 tablet (25 mg total) by mouth every 6 (six) hours as needed for anxiety.   traZODone (DESYREL) 50 MG tablet Take 1 tablet (50 mg total) by mouth at bedtime as needed for sleep.   empagliflozin (JARDIANCE) 10 MG TABS tablet Take 1 tablet (10 mg total) by mouth daily before breakfast.   naltrexone (DEPADE) 50 MG tablet Take 1 tablet (50 mg total) by mouth at bedtime.   lidocaine (LIDODERM) 5 % Place 1 patch onto the skin daily. Remove & Discard patch within 12 hours or as directed by MD       06/27/2023   10:32 AM 06/27/2023    8:29 AM 06/26/2023   11:17 AM  Depression screen PHQ 2/9  Decreased Interest 0 0 0  Down, Depressed, Hopeless 0 0 0  PHQ - 2 Score 0 0 0  Altered sleeping  0 1  Tired, decreased energy  0 1  Change in appetite  0 0  Feeling bad or failure about yourself   0 1  Trouble concentrating  0 0  Moving slowly or fidgety/restless  0 0  Suicidal thoughts  0 0  PHQ-9 Score  0 3  Difficult doing work/chores   Somewhat difficult    Flowsheet Row ED from 06/27/2023 in [facility] Most recent reading at  06/27/2023  3:58 PM ED from 06/23/2023 in [facility] Most recent reading at 06/26/2023  9:47 AM ED from 06/23/2023 in [facility] Most recent reading at 06/23/2023  4:18 AM  C-SSRS RISK CATEGORY No  Risk No Risk Low Risk       Musculoskeletal  Strength & Muscle Tone: within normal limits Gait & Station: normal Patient leans: N/A  Psychiatric Specialty Exam  Presentation  General Appearance:  Appropriate for Environment; Casual  Eye Contact: Good  Speech: Clear and Coherent; Normal Rate  Speech Volume: Normal  Handedness: Right   Mood and Affect  Mood: Euthymic; Irritable  Affect: Appropriate; Congruent   Thought Process  Thought Processes: Coherent; Linear  Descriptions of Associations:Intact  Orientation:Full (Time, Place and Person)  Thought Content:WDL  Diagnosis of Schizophrenia or Schizoaffective disorder in past: No    Hallucinations:Hallucinations: None   Ideas of Reference:None  Suicidal Thoughts:Suicidal Thoughts: No   Homicidal Thoughts:Homicidal Thoughts: No    Sensorium  Memory: Immediate Good; Recent Good  Judgment: Fair  Insight: Good   Executive Functions  Concentration: Good  Attention Span: Good  Recall: Good  Fund of Knowledge: Good  Language: Good   Psychomotor Activity  Psychomotor Activity: Psychomotor Activity: Normal AIMS Completed?: No    Assets  Assets: Communication Skills; Desire for Improvement; Social Support   Sleep  Sleep: Sleep: Good    No data recorded   Physical Exam  Physical Exam Vitals and nursing note reviewed.  Constitutional:      Appearance: Normal appearance. He is normal weight.  Skin:    Capillary Refill: Capillary refill takes less than 2 seconds.  Neurological:     General: No focal deficit present.     Mental Status: He is alert and oriented to person, place, and time. Mental status is at baseline.   Psychiatric:        Mood and Affect: Mood normal.        Behavior: Behavior normal.        Thought Content: Thought content normal.        Judgment: Judgment normal.    Review of Systems  Psychiatric/Behavioral: Negative.    All other systems reviewed and are negative.  Blood pressure (!) 154/84, pulse 78, temperature 98.2 F (36.8 C), temperature source Oral, resp. rate 18, SpO2 100%. There is no height or weight on file to calculate BMI.  Demographic Factors:  Male, Low socioeconomic status, and Unemployed  Loss Factors: Decrease in vocational status, Decline in physical health, and Financial problems/change in socioeconomic status  Historical Factors: Family history of mental illness or substance abuse and Impulsivity  Risk Reduction Factors:   Positive social support, Positive therapeutic relationship, and Positive coping skills or problem solving skills  Continued Clinical Symptoms:  Severe Anxiety and/or Agitation Alcohol/Substance Abuse/Dependencies More than one psychiatric diagnosis Unstable or Poor Therapeutic Relationship Previous Psychiatric Diagnoses and Treatments  Cognitive Features That Contribute To Risk:  None    Suicide Risk:  Minimal: No identifiable suicidal ideation.  Patients presenting with no risk factors but with morbid ruminations; may be classified as minimal risk based on the severity of the depressive symptoms  Plan Of Care/Follow-up recommendations:  Other:  Discharge to [employer] station for transportation to [facility] for inpatient rehab. Continue current medications listed below. He is being discharged with prescriptions for this at this time.  Alcohol use disorder Stimulant use disorder-cocaine -Advise cessation -continue CIWA -Continue Naltrexone 50 mg QHS   Chronic systolic heart failure Hypertension Dyslipidemia -Continue Lipitor 80 mg daily -Continue Coreg 12.5 mg twice daily with meals -Replace Farxiga  with Jardiance 10 mg daily -Continue Lasix 40 mg as needed for fluid/edema -Continue Entresto 1 tablet twice daily -Discontinued Aldactone  25 mg daily, as not prescribed during visit.   Disposition: Social work continues to seek placement in residential Rehab facility   Disposition: Discharge to [facility]   Lamar Sprinkles, MD 06/29/2023, 7:45 AM

## 2023-06-29 NOTE — ED Notes (Signed)
Patient discharged to [facility] per MD order. After Visit Summary (AVS) printed and given to patient, as well as printed prescriptions. AVS reviewed with patient and all questions fully answered. Patient discharged in no acute distress, A& O x4 and ambulatory. Patient denied SI/HI, A/VH upon discharge. Patient verbalized understanding of all discharge instructions explained by staff, including follow up appointments, RX's and safety plan. Patient mood fair.  Patient belongings returned to patient from locker #28 complete and intact. Patient escorted to lobby via staff for transport to destination. Safety maintained.

## 2023-07-01 ENCOUNTER — Other Ambulatory Visit (HOSPITAL_COMMUNITY): Payer: Self-pay

## 2023-07-04 ENCOUNTER — Other Ambulatory Visit (HOSPITAL_COMMUNITY): Payer: Self-pay

## 2023-07-31 ENCOUNTER — Ambulatory Visit (HOSPITAL_COMMUNITY)
Admission: EM | Admit: 2023-07-31 | Discharge: 2023-07-31 | Disposition: A | Discharge status: Home / Self Care | Attending: Psychiatry | Admitting: Psychiatry

## 2023-07-31 DIAGNOSIS — Z765 Malingerer [conscious simulation]: Secondary | ICD-10-CM | POA: Insufficient documentation

## 2023-07-31 DIAGNOSIS — F1490 Cocaine use, unspecified, uncomplicated: Secondary | ICD-10-CM

## 2023-07-31 DIAGNOSIS — F1010 Alcohol abuse, uncomplicated: Secondary | ICD-10-CM | POA: Insufficient documentation

## 2023-07-31 DIAGNOSIS — F1410 Cocaine abuse, uncomplicated: Secondary | ICD-10-CM | POA: Insufficient documentation

## 2023-07-31 DIAGNOSIS — Z5900 Homelessness unspecified: Secondary | ICD-10-CM | POA: Insufficient documentation

## 2023-07-31 MED ORDER — CLONIDINE HCL 0.1 MG PO TABS
0.1000 mg | ORAL_TABLET | Freq: Once | ORAL | Status: AC
  Administered 2023-07-31: 0.1 mg via ORAL
  Filled 2023-07-31: qty 1

## 2023-07-31 NOTE — Progress Notes (Signed)
   07/31/23 0142  [facility] Triage Screening (Walk-ins at [facility] only)  What Is the Reason for Your Visit/Call Today? Patient presents to the [facility] as a walk-in.  He states that he was just discharged from the [facility] today after completing their 28 day program.  He states that they tried to place him in a halfway house, but there was no bed availability locally.  He states that he is homeless.  He states that he ran into a friend who encouraged him to have a beer which triggered him to smoke crack cocaine.  Patient states that he dran a 40 oz beer and smoked 1 gram of cocaine tonight.  Prior to going to treatment, he states that he was smoking 2 grams of cocaine every other day and drinking a 12 pack of beer every other day.  He has no current complaints of withdrawal symptoms and states that he just needs to get back into detox.  Patient denies SI/HI/Psychosis and states that he has never attempted to harm himself or anyone else.  Patient states that he has no access to weapons.  Patient states that his sleep and appetite are good.  He denies any history of abuse or self-mutilating behaviors.  Patient is routine.  How Long Has This Been Causing You Problems? <Week  Have You Recently Had Any Thoughts About Hurting Yourself? No  How long ago did you have thoughts about hurting yourself? none reported  Are You Planning to Commit Suicide/Harm Yourself At This time? No  Have you Recently Had Thoughts About Hurting Someone Karolee Ohs? No  Physical Abuse Denies  Verbal Abuse Denies  Sexual Abuse Denies  Exploitation of patient/patient's resources Denies  Self-Neglect Denies  Possible abuse reported to: Other (Comment) (NA)  Are you currently experiencing any auditory, visual or other hallucinations? No  Have You Used Any Alcohol or Drugs in the Past 24 Hours? Yes  What Did You Use and How Much? drank a forty ounce beer and smoked a gram or cocaine tonight  Do you have any current medical  co-morbidities that require immediate attention? No  Clinician description of patient physical appearance/behavior: Patient is clean, neat, casually dressed.  Patient is cooperative  What Do You Feel Would Help You the Most Today? Alcohol or Drug Use Treatment  If access to [facility] was not available, would you have sought care in the Emergency Department? No  Determination of Need Routine (7 days)  Options For Referral Chemical Dependency Intensive Outpatient Therapy (CDIOP)

## 2023-07-31 NOTE — ED Notes (Signed)
 NP Sindy Guadeloupe notified of abnormal vital signs.

## 2023-07-31 NOTE — ED Provider Notes (Signed)
 Behavioral Health Urgent Care Medical Screening Exam  Patient Name: Joseph Reese MRN: [id number] Date of Evaluation: 07/31/23 Chief Complaint:  I had a 40 oz beer and don't have any where to go  Diagnosis:  Final diagnoses:  Alcohol abuse  Cocaine use  Homelessness  Malingering    History of Present illness: Joseph Reese is a 58 y.o. male. With a history of cocaine abuse, substance abuse, alcohol disorder.  Presented to the [facility] voluntarily.  Per the patient to get detox from alcohol but asked, as alcohol history patient said she just got back from [place] treatment center today but he had a beer and so he needed detox from that..  Patient was at [facility] treatment center for a couple weeks and was discharged 07/30/2023 which was patient stated that he had a beer and he had 1 at home.  Cocaine.  Writer discussed with patient the need for outpatient treatment center.  Patient has nowhere to go.  According to the patient they wanted him to go to a halfway house but he did not want to go there anymore, back to [place].  Patient is fairly groomed, denies access to guns, denies wanting to hurt himself or others.  Face-to-face evaluation of patient, patient is alert and oriented x 4, speech is clear, maintaining eye contact.  Patient does not appear to be in any distress.  Patient denied SI, HI, AVH or paranoia.  Patient reports he drunk a 40 ounce beer after being released from detox yesterday, patient also reports he is currently homeless and needs somewhere to live.  Writer discussed with patient the need to reach out to the main shelter for assistance with housing.  Patient would more benefit from outpatient treatment center as patient was just released from detox and therefore need to follow up with outpatient care that was given to him by the detox facility.  Patient does seem to be seeking secondary gain because of homelessness.  Patient does not appear intoxicated or in any withdrawal.   Patient is currently taking does not show any sign of distress.  Patient was given resources and information for outpatient treatment center. Patient is advised to call 911 or return to the nearest ED should he experience any suicidal ideation or homicidal thoughts or hallucination.  Patient verbalized understanding.  At the time of discharge patient does not pose a risk to himself or others and can safely be discharged to f/u with  outpatient treatment center.  Flowsheet Row ED from 07/31/2023 in [facility] ED from 06/27/2023 in [facility] ED from 06/23/2023 in [facility]  C-SSRS RISK CATEGORY Error: Q3, 4, or 5 should not be populated when Q2 is No No Risk No Risk       Psychiatric Specialty Exam  Presentation  General Appearance:Casual  Eye Contact:Good  Speech:Clear and Coherent  Speech Volume:Normal  Handedness:Right   Mood and Affect  Mood: Anxious  Affect: Congruent   Thought Process  Thought Processes: Coherent  Descriptions of Associations:Intact  Orientation:Full (Time, Place and Person)  Thought Content:Logical  Diagnosis of Schizophrenia or Schizoaffective disorder in past: No   Hallucinations:None  Ideas of Reference:None  Suicidal Thoughts:No  Homicidal Thoughts:No   Sensorium  Memory: Immediate Good  Judgment: Fair  Insight: Fair   Art therapist  Concentration: Fair  Attention Span: Good  Recall: Good  Fund of Knowledge: Good  Language: Good   Psychomotor Activity  Psychomotor Activity: Normal No  Assets  Assets: Desire for Improvement; Housing   Sleep  Sleep: Fair  Number of hours:  8   Physical Exam: Physical Exam HENT:     Head: Normocephalic.     Nose: Nose normal.  Eyes:     Pupils: Pupils are equal, round, and reactive to light.  Pulmonary:     Effort: Pulmonary effort is normal.  Musculoskeletal:         General: Normal range of motion.     Cervical back: Normal range of motion.  Neurological:     General: No focal deficit present.     Mental Status: He is alert.  Psychiatric:        Mood and Affect: Mood normal.        Behavior: Behavior normal.        Thought Content: Thought content normal.        Judgment: Judgment normal.    Review of Systems  Constitutional: Negative.   HENT: Negative.    Eyes: Negative.   Respiratory: Negative.    Cardiovascular: Negative.   Gastrointestinal: Negative.   Genitourinary: Negative.   Skin: Negative.   Neurological: Negative.   Psychiatric/Behavioral:  Positive for substance abuse. The patient is nervous/anxious.    Blood pressure (!) 140/80, pulse 94, temperature 98.7 F (37.1 C), resp. rate 20. There is no height or weight on file to calculate BMI.  Musculoskeletal: Strength & Muscle Tone: within normal limits Gait & Station: normal Patient leans: N/A   [facility] MSE Discharge Disposition for Follow up and Recommendations: Based on my evaluation the patient does not appear to have an emergency medical condition and can be discharged with resources and follow up care in outpatient services for Substance Abuse Intensive Outpatient Program   Sindy Guadeloupe, NP 07/31/2023, 5:09 AM

## 2023-08-16 ENCOUNTER — Other Ambulatory Visit: Payer: Self-pay

## 2023-08-16 ENCOUNTER — Emergency Department (HOSPITAL_COMMUNITY)
Admission: EM | Admit: 2023-08-16 | Discharge: 2023-08-17 | Disposition: A | Discharge status: Home / Self Care | Attending: Emergency Medicine | Admitting: Emergency Medicine

## 2023-08-16 ENCOUNTER — Encounter (HOSPITAL_COMMUNITY): Payer: Self-pay | Admitting: Emergency Medicine

## 2023-08-16 ENCOUNTER — Emergency Department (HOSPITAL_COMMUNITY)

## 2023-08-16 DIAGNOSIS — R42 Dizziness and giddiness: Secondary | ICD-10-CM | POA: Insufficient documentation

## 2023-08-16 DIAGNOSIS — R7989 Other specified abnormal findings of blood chemistry: Secondary | ICD-10-CM | POA: Diagnosis not present

## 2023-08-16 DIAGNOSIS — R079 Chest pain, unspecified: Secondary | ICD-10-CM | POA: Insufficient documentation

## 2023-08-16 LAB — CBC
HCT: 38.4 % — ABNORMAL LOW (ref 39.0–52.0)
Hemoglobin: 13.1 g/dL (ref 13.0–17.0)
MCH: 28 pg (ref 26.0–34.0)
MCHC: 34.1 g/dL (ref 30.0–36.0)
MCV: 82.1 fL (ref 80.0–100.0)
Platelets: 276 10*3/uL (ref 150–400)
RBC: 4.68 MIL/uL (ref 4.22–5.81)
RDW: 14.3 % (ref 11.5–15.5)
WBC: 5.7 10*3/uL (ref 4.0–10.5)
nRBC: 0 % (ref 0.0–0.2)

## 2023-08-16 LAB — BASIC METABOLIC PANEL WITH GFR
Anion gap: 8 (ref 5–15)
BUN: 9 mg/dL (ref 6–20)
CO2: 22 mmol/L (ref 22–32)
Calcium: 9.3 mg/dL (ref 8.9–10.3)
Chloride: 108 mmol/L (ref 98–111)
Creatinine, Ser: 0.86 mg/dL (ref 0.61–1.24)
GFR, Estimated: >60 mL/min (ref >60)
Glucose, Bld: 82 mg/dL (ref 70–99)
Potassium: 3.6 mmol/L (ref 3.5–5.1)
Sodium: 138 mmol/L (ref 135–145)

## 2023-08-16 LAB — TROPONIN I (HIGH SENSITIVITY): Troponin I (High Sensitivity): 21 ng/L — ABNORMAL HIGH (ref <18)

## 2023-08-16 NOTE — ED Triage Notes (Signed)
 Pt in with cp and dizziness that began at noon while walking around in his house - states a "pressure pain" hit him about 4x's while at home, prompting him to come to ED. Hx of CHF, states last cocaine use yesterday

## 2023-08-17 LAB — TROPONIN I (HIGH SENSITIVITY): Troponin I (High Sensitivity): 20 ng/L — ABNORMAL HIGH (ref <18)

## 2023-08-17 NOTE — ED Notes (Signed)
Pt walked to the restroom with steady gait.

## 2023-08-17 NOTE — ED Notes (Signed)
 Laying down: 154/85 (106) Siting at side of bed: 164/103 (122)  Standing up: 156/101 (118)

## 2023-08-17 NOTE — Discharge Instructions (Addendum)
 It's possible that the chest pain you experienced was from the cocaine. I recommend not using cocaine.   Your blood work and imaging looked good.    Please follow-up with your regular doctor.

## 2023-08-17 NOTE — ED Provider Notes (Signed)
 [facility]-EM[facility]ENCY DEPT [facility] Emergency Department Provider Note MRN:  [id number]  Arrival date & time: 08/17/23     Chief Complaint   Chest Pain and Dizziness   History of Present Illness   Joseph Reese is a 58 y.o. year-old male presents to the ED with chief complaint of chest pain and dizziness that started yesterday around lunch.  Denies any SOB, cough, or fever.  Denies any other symptoms.  States that he isn't having the chest pain now.  States that he used cocaine yesterday.  He states that the dizziness he experienced what more of a lightheadedness.  He denies any ataxia.   History provided by patient.   Review of Systems  Pertinent positive and negative review of systems noted in HPI.    Physical Exam   Vitals:   08/16/23 2149 08/17/23 0412  BP: (!) 142/91 (!) 175/107  Pulse: 75 71  Resp: 18 18  Temp: 98.3 F (36.8 C) 97.9 F (36.6 C)  SpO2: 100% 100%    CONSTITUTIONAL:  well-appearing, NAD NEURO:  Alert and oriented x 3, CN 3-12 grossly intact EYES:  eyes equal and reactive ENT/NECK:  Supple, no stridor  CARDIO:  normal rate, regular rhythm, appears well-perfused  PULM:  No respiratory distress, CTAB GI/GU:  non-distended,  MSK/SPINE:  No gross deformities, no edema, moves all extremities  SKIN:  no rash, atraumatic   *Additional and/or pertinent findings included in MDM below  Diagnostic and Interventional Summary    EKG Interpretation Date/Time:  Friday August 16 2023 21:55:17 EDT Ventricular Rate:  75 PR Interval:  194 QRS Duration:  96 QT Interval:  436 QTC Calculation: 486 R Axis:   42  Text Interpretation: Normal sinus rhythm Minimal voltage criteria for LVH, may be normal variant ( Sokolow-Lyon ) Nonspecific ST and T wave abnormality Abnormal ECG No significant change since last tracing Confirmed by Zadie Rhine ([id number]) on 08/17/2023 3:18:55 AM       Labs Reviewed  CBC - Abnormal; Notable for the following components:      Result  Value   HCT 38.4 (*)    All other components within normal limits  TROPONIN I (HIGH SENSITIVITY) - Abnormal; Notable for the following components:   Troponin I (High Sensitivity) 21 (*)    All other components within normal limits  TROPONIN I (HIGH SENSITIVITY) - Abnormal; Notable for the following components:   Troponin I (High Sensitivity) 20 (*)    All other components within normal limits  BASIC METABOLIC PANEL WITH GFR    DG Chest 2 View  Final Result      Medications - No data to display   Procedures  /  Critical Care Procedures  ED Course and Medical Decision Making  I have reviewed the triage vital signs, the nursing notes, and pertinent available records from the EMR.  Social Determinants Affecting Complexity of Care: Patient has no clinically significant social determinants affecting this chief complaint..   ED Course: Clinical Course as of 08/17/23 0525  Sat Aug 17, 2023  0521 Laying down: 154/85 (106) Siting at side of bed: 164/103 (122) Standing up: 156/101 (118)   [RB]  0523 Troponin I (High Sensitivity)(!) Trops are flat and similar to baseline.  Doubt ACS. [RB]  0523 DG Chest 2 View No obvious opacity or effusion [RB]  0523 ED EKG [RB]    Clinical Course User Index [RB] Roxy Horseman, PA-C    Medical Decision Making Patient here with chest pain and lightheadedness.  Symptoms started yesterday.  He did use cocaine yesterday.  He states that his symptoms have since resolved.  Labs ordered in triage are fairly reassuring.  His troponins are slightly elevated, but are flat.  He is not having any chest pain now.  Vital signs are stable.  He ambulates to the bathroom without any difficulty.  He is eating and drinking.  Thank you  EKG is without significant changes from prior.  No marked electrolyte abnormality.    Patient encouraged to stop using cocaine.  Amount and/or Complexity of Data Reviewed Labs: ordered. Decision-making details documented in  ED Course. Radiology: ordered. Decision-making details documented in ED Course. ECG/medicine tests:  Decision-making details documented in ED Course.         Consultants: No consultations were needed in caring for this patient.   Treatment and Plan: I considered admission due to patient's initial presentation, but after considering the examination and diagnostic results, patient will not require admission and can be discharged with outpatient follow-up.    Final Clinical Impressions(s) / ED Diagnoses     ICD-10-CM   1. Chest pain, unspecified type  R07.9       ED Discharge Orders     None         Discharge Instructions Discussed with and Provided to Patient:     Discharge Instructions      It's possible that the chest pain you experienced was from the cocaine. I recommend not using cocaine.   Your blood work and imaging looked good.    Please follow-up with your regular doctor.       Roxy Horseman, PA-C 08/17/23 1478    Zadie Rhine, MD 08/17/23 [phone]

## 2023-08-22 ENCOUNTER — Other Ambulatory Visit: Payer: Self-pay

## 2023-08-22 ENCOUNTER — Other Ambulatory Visit (HOSPITAL_COMMUNITY)
Admission: EM | Admit: 2023-08-22 | Discharge: 2023-08-29 | Disposition: A | Discharge status: Home / Self Care | Attending: Psychiatry | Admitting: Behavioral Health

## 2023-08-22 ENCOUNTER — Ambulatory Visit (HOSPITAL_COMMUNITY)
Admission: EM | Admit: 2023-08-22 | Discharge: 2023-08-22 | Disposition: A | Discharge status: Other Acute Inpatient Hospital | Attending: Psychiatry | Admitting: Psychiatry

## 2023-08-22 DIAGNOSIS — F1490 Cocaine use, unspecified, uncomplicated: Secondary | ICD-10-CM | POA: Insufficient documentation

## 2023-08-22 DIAGNOSIS — Z91148 Patient's other noncompliance with medication regimen for other reason: Secondary | ICD-10-CM | POA: Insufficient documentation

## 2023-08-22 DIAGNOSIS — F1990 Other psychoactive substance use, unspecified, uncomplicated: Secondary | ICD-10-CM | POA: Diagnosis present

## 2023-08-22 DIAGNOSIS — Z5900 Homelessness unspecified: Secondary | ICD-10-CM | POA: Diagnosis not present

## 2023-08-22 DIAGNOSIS — F1410 Cocaine abuse, uncomplicated: Secondary | ICD-10-CM | POA: Insufficient documentation

## 2023-08-22 DIAGNOSIS — E785 Hyperlipidemia, unspecified: Secondary | ICD-10-CM | POA: Diagnosis not present

## 2023-08-22 DIAGNOSIS — F1010 Alcohol abuse, uncomplicated: Secondary | ICD-10-CM | POA: Diagnosis not present

## 2023-08-22 DIAGNOSIS — F1590 Other stimulant use, unspecified, uncomplicated: Secondary | ICD-10-CM | POA: Insufficient documentation

## 2023-08-22 DIAGNOSIS — F1090 Alcohol use, unspecified, uncomplicated: Secondary | ICD-10-CM

## 2023-08-22 DIAGNOSIS — G4700 Insomnia, unspecified: Secondary | ICD-10-CM | POA: Insufficient documentation

## 2023-08-22 DIAGNOSIS — F1910 Other psychoactive substance abuse, uncomplicated: Secondary | ICD-10-CM

## 2023-08-22 DIAGNOSIS — F17210 Nicotine dependence, cigarettes, uncomplicated: Secondary | ICD-10-CM | POA: Insufficient documentation

## 2023-08-22 DIAGNOSIS — I10 Essential (primary) hypertension: Secondary | ICD-10-CM | POA: Insufficient documentation

## 2023-08-22 HISTORY — DX: Other psychoactive substance use, unspecified, uncomplicated: F19.90

## 2023-08-22 LAB — COMPREHENSIVE METABOLIC PANEL WITH GFR
ALT: 23 U/L (ref 0–44)
AST: 25 U/L (ref 15–41)
Albumin: 4.1 g/dL (ref 3.5–5.0)
Alkaline Phosphatase: 59 U/L (ref 38–126)
Anion gap: 13 (ref 5–15)
BUN: 8 mg/dL (ref 6–20)
CO2: 24 mmol/L (ref 22–32)
Calcium: 9.6 mg/dL (ref 8.9–10.3)
Chloride: 105 mmol/L (ref 98–111)
Creatinine, Ser: 0.77 mg/dL (ref 0.61–1.24)
GFR, Estimated: >60 mL/min (ref >60)
Glucose, Bld: 82 mg/dL (ref 70–99)
Potassium: 3.9 mmol/L (ref 3.5–5.1)
Sodium: 142 mmol/L (ref 135–145)
Total Bilirubin: 0.6 mg/dL (ref 0.0–1.2)
Total Protein: 7.6 g/dL (ref 6.5–8.1)

## 2023-08-22 LAB — POCT URINE DRUG SCREEN - MANUAL ENTRY (I-SCREEN)
POC Amphetamine UR: NOT DETECTED
POC Buprenorphine (BUP): NOT DETECTED
POC Cocaine UR: NOT DETECTED
POC Marijuana UR: NOT DETECTED
POC Methadone UR: NOT DETECTED
POC Methamphetamine UR: NOT DETECTED
POC Morphine: NOT DETECTED
POC Oxazepam (BZO): NOT DETECTED
POC Oxycodone UR: NOT DETECTED
POC Secobarbital (BAR): NOT DETECTED

## 2023-08-22 LAB — CBC WITH DIFFERENTIAL/PLATELET
Abs Immature Granulocytes: 0.02 10*3/uL (ref 0.00–0.07)
Basophils Absolute: 0 10*3/uL (ref 0.0–0.1)
Basophils Relative: 0 %
Eosinophils Absolute: 0.2 10*3/uL (ref 0.0–0.5)
Eosinophils Relative: 3 %
HCT: 45.3 % (ref 39.0–52.0)
Hemoglobin: 15.3 g/dL (ref 13.0–17.0)
Immature Granulocytes: 0 %
Lymphocytes Relative: 37 %
Lymphs Abs: 2.3 10*3/uL (ref 0.7–4.0)
MCH: 28.2 pg (ref 26.0–34.0)
MCHC: 33.8 g/dL (ref 30.0–36.0)
MCV: 83.4 fL (ref 80.0–100.0)
Monocytes Absolute: 0.5 10*3/uL (ref 0.1–1.0)
Monocytes Relative: 8 %
Neutro Abs: 3.1 10*3/uL (ref 1.7–7.7)
Neutrophils Relative %: 52 %
Platelets: 293 10*3/uL (ref 150–400)
RBC: 5.43 MIL/uL (ref 4.22–5.81)
RDW: 14.1 % (ref 11.5–15.5)
WBC: 6.1 10*3/uL (ref 4.0–10.5)
nRBC: 0 % (ref 0.0–0.2)

## 2023-08-22 LAB — ETHANOL: Alcohol, Ethyl (B): 172 mg/dL — ABNORMAL HIGH (ref <10)

## 2023-08-22 LAB — TSH: TSH: 0.901 u[IU]/mL (ref 0.350–4.500)

## 2023-08-22 MED ORDER — DIPHENHYDRAMINE HCL 50 MG PO CAPS
50.0000 mg | ORAL_CAPSULE | Freq: Three times a day (TID) | ORAL | Status: DC | PRN
  Administered 2023-08-27 – 2023-08-29 (×3): 50 mg via ORAL
  Filled 2023-08-22 (×3): qty 1

## 2023-08-22 MED ORDER — OLANZAPINE 10 MG IM SOLR: 10.0000 mg | Freq: Three times a day (TID) | INTRAMUSCULAR | Status: DC | PRN

## 2023-08-22 MED ORDER — OLANZAPINE 5 MG PO TBDP: 5.0000 mg | ORAL_TABLET | Freq: Three times a day (TID) | ORAL | Status: DC | PRN

## 2023-08-22 MED ORDER — DIPHENHYDRAMINE HCL 50 MG/ML IJ SOLN: 50.0000 mg | Freq: Three times a day (TID) | INTRAMUSCULAR | Status: DC | PRN

## 2023-08-22 MED ORDER — LORAZEPAM 1 MG PO TABS
1.0000 mg | ORAL_TABLET | Freq: Every day | ORAL | Status: AC
  Administered 2023-08-25: 1 mg via ORAL
  Filled 2023-08-22: qty 1

## 2023-08-22 MED ORDER — TRAZODONE HCL 50 MG PO TABS
50.0000 mg | ORAL_TABLET | Freq: Every evening | ORAL | Status: DC | PRN
  Administered 2023-08-22 – 2023-08-26 (×5): 50 mg via ORAL
  Filled 2023-08-22 (×5): qty 1

## 2023-08-22 MED ORDER — LORAZEPAM 1 MG PO TABS: 1.0000 mg | ORAL_TABLET | Freq: Four times a day (QID) | ORAL | Status: DC | PRN

## 2023-08-22 MED ORDER — ACETAMINOPHEN 325 MG PO TABS
650.0000 mg | ORAL_TABLET | Freq: Four times a day (QID) | ORAL | Status: DC | PRN
  Administered 2023-08-23 – 2023-08-28 (×6): 650 mg via ORAL
  Filled 2023-08-22 (×6): qty 2

## 2023-08-22 MED ORDER — ACETAMINOPHEN 325 MG PO TABS
650.0000 mg | ORAL_TABLET | Freq: Four times a day (QID) | ORAL | Status: DC | PRN
  Administered 2023-08-22 (×2): 650 mg via ORAL
  Filled 2023-08-22 (×2): qty 2

## 2023-08-22 MED ORDER — HALOPERIDOL LACTATE 5 MG/ML IJ SOLN: 10.0000 mg | Freq: Three times a day (TID) | INTRAMUSCULAR | Status: DC | PRN

## 2023-08-22 MED ORDER — LORAZEPAM 1 MG PO TABS
1.0000 mg | ORAL_TABLET | Freq: Four times a day (QID) | ORAL | Status: AC
  Administered 2023-08-22 (×2): 1 mg via ORAL
  Filled 2023-08-22 (×2): qty 1

## 2023-08-22 MED ORDER — LORAZEPAM 1 MG PO TABS: 1.0000 mg | ORAL_TABLET | Freq: Four times a day (QID) | ORAL | Status: AC | PRN

## 2023-08-22 MED ORDER — LORAZEPAM 2 MG/ML IJ SOLN: 2.0000 mg | Freq: Three times a day (TID) | INTRAMUSCULAR | Status: DC | PRN

## 2023-08-22 MED ORDER — LORAZEPAM 1 MG PO TABS
1.0000 mg | ORAL_TABLET | Freq: Three times a day (TID) | ORAL | Status: AC
  Administered 2023-08-23 (×3): 1 mg via ORAL
  Filled 2023-08-22 (×3): qty 1

## 2023-08-22 MED ORDER — CLONIDINE HCL 0.1 MG PO TABS
0.1000 mg | ORAL_TABLET | Freq: Once | ORAL | Status: AC
  Administered 2023-08-22: 0.1 mg via ORAL
  Filled 2023-08-22: qty 1

## 2023-08-22 MED ORDER — HYDROXYZINE HCL 25 MG PO TABS: 25.0000 mg | ORAL_TABLET | Freq: Four times a day (QID) | ORAL | Status: DC | PRN

## 2023-08-22 MED ORDER — OLANZAPINE 10 MG IM SOLR: 5.0000 mg | Freq: Three times a day (TID) | INTRAMUSCULAR | Status: DC | PRN

## 2023-08-22 MED ORDER — ONDANSETRON 4 MG PO TBDP: 4.0000 mg | ORAL_TABLET | Freq: Four times a day (QID) | ORAL | Status: DC | PRN

## 2023-08-22 MED ORDER — LORAZEPAM 1 MG PO TABS: 1.0000 mg | ORAL_TABLET | Freq: Every day | ORAL | Status: DC

## 2023-08-22 MED ORDER — ALUM & MAG HYDROXIDE-SIMETH 200-200-20 MG/5ML PO SUSP: 30.0000 mL | ORAL | Status: DC | PRN

## 2023-08-22 MED ORDER — MAGNESIUM HYDROXIDE 400 MG/5ML PO SUSP: 30.0000 mL | Freq: Every day | ORAL | Status: DC | PRN

## 2023-08-22 MED ORDER — ADULT MULTIVITAMIN W/MINERALS CH
1.0000 | ORAL_TABLET | Freq: Every day | ORAL | Status: DC
  Administered 2023-08-22: 1 via ORAL
  Filled 2023-08-22: qty 1

## 2023-08-22 MED ORDER — HALOPERIDOL 5 MG PO TABS
5.0000 mg | ORAL_TABLET | Freq: Three times a day (TID) | ORAL | Status: DC | PRN
  Administered 2023-08-27 (×2): 5 mg via ORAL
  Filled 2023-08-22 (×2): qty 1

## 2023-08-22 MED ORDER — LOPERAMIDE HCL 2 MG PO CAPS: 2.0000 mg | ORAL_CAPSULE | ORAL | Status: DC | PRN

## 2023-08-22 MED ORDER — HYDROXYZINE HCL 25 MG PO TABS
25.0000 mg | ORAL_TABLET | Freq: Three times a day (TID) | ORAL | Status: DC | PRN
  Administered 2023-08-29: 25 mg via ORAL
  Filled 2023-08-22: qty 1

## 2023-08-22 MED ORDER — THIAMINE MONONITRATE 100 MG PO TABS: 100.0000 mg | ORAL_TABLET | Freq: Every day | ORAL | Status: DC

## 2023-08-22 MED ORDER — LORAZEPAM 1 MG PO TABS
1.0000 mg | ORAL_TABLET | Freq: Four times a day (QID) | ORAL | Status: DC
  Administered 2023-08-22 (×2): 1 mg via ORAL
  Filled 2023-08-22 (×2): qty 1

## 2023-08-22 MED ORDER — THIAMINE HCL 100 MG/ML IJ SOLN
100.0000 mg | Freq: Once | INTRAMUSCULAR | Status: AC
  Administered 2023-08-22: 100 mg via INTRAMUSCULAR
  Filled 2023-08-22: qty 2

## 2023-08-22 MED ORDER — LORAZEPAM 1 MG PO TABS: 1.0000 mg | ORAL_TABLET | Freq: Three times a day (TID) | ORAL | Status: DC

## 2023-08-22 MED ORDER — LORAZEPAM 1 MG PO TABS: 1.0000 mg | ORAL_TABLET | Freq: Two times a day (BID) | ORAL | Status: DC

## 2023-08-22 MED ORDER — HALOPERIDOL LACTATE 5 MG/ML IJ SOLN: 5.0000 mg | Freq: Three times a day (TID) | INTRAMUSCULAR | Status: DC | PRN

## 2023-08-22 MED ORDER — LORAZEPAM 1 MG PO TABS
1.0000 mg | ORAL_TABLET | Freq: Two times a day (BID) | ORAL | Status: AC
  Administered 2023-08-24 (×2): 1 mg via ORAL
  Filled 2023-08-22 (×2): qty 1

## 2023-08-22 NOTE — ED Provider Notes (Signed)
 [facility]/OBS ASAP Discharge Summary  Date and Time: 08/22/2023 10:47 AM  Name: Joseph Reese  MRN:  [id number]   Discharge Diagnoses:  Final diagnoses:  Alcohol abuse  Cocaine abuse (HCC)  Substance abuse (HCC)    Subjective: On evaluation, patient is alert and oriented x 4. His thought process is linear and goal oriented. His speech is clear and coherent at a moderate tone. His mood is euthymic and affect is congruent. He denies depression, anxiety and mania. He denies SI/HI/AVH. Objectively, no signs of psychosis. He is calm and cooperative and does not appear to be in acute distress. Patient states that he completed a substance abuse program at [facility] for 30 days and discharged on March 19.  He states that he relapsed the day after. He reports drinking alcohol every day, on average a 20 pack of beer and a couple of shots. He reports that he last consumed alcohol yesterday. He reports drinking alcohol since age 39 years old. He reports withdrawal symptoms of headache. He denies a history of alcohol withdrawal seizures or delirium tremens. He reports using cocaine every day, a gram per day and states that he last used yesterday. He reports using cocaine since age 41 years old. He reports a medical history of hypertension. He is unable to recall the antihypertensive that he is prescribed for hypertension. Other than headache, he denies physical symptoms.  Stay Summary: Joseph Reese is a 58 year old male patient with a past history of cocaine, substance abuse, alcohol disorder, and MDD who presented to the [facility] this morning on 4/10 requesting substance abuse treatment for alcohol use and cocaine use. BAL on arrival 172 and UDS negative.   Total Time spent with patient: 30 minutes  Past Psychiatric History: history of cocaine, substance abuse, alcohol disorder, and MDD.   Past Medical History: history of HTN.  Family Psychiatric History: No reported history.   Social History:  Homelessness. Patient reports cocaine and alcohol use.   Tobacco Cessation:  N/A, patient does not currently use tobacco products  Current Medications:  Current Facility-Administered Medications  Medication Dose Route Frequency Provider Last Rate Last Admin   acetaminophen (TYLENOL) tablet 650 mg  650 mg Oral Q6H PRN Sindy Guadeloupe, NP   650 mg at 08/22/23 0924   alum & mag hydroxide-simeth (MAALOX/MYLANTA) 200-200-20 MG/5ML suspension 30 mL  30 mL Oral Q4H PRN Sindy Guadeloupe, NP       hydrOXYzine (ATARAX) tablet 25 mg  25 mg Oral Q6H PRN Sindy Guadeloupe, NP       loperamide (IMODIUM) capsule 2-4 mg  2-4 mg Oral PRN Sindy Guadeloupe, NP       LORazepam (ATIVAN) tablet 1 mg  1 mg Oral Q6H PRN Sindy Guadeloupe, NP       LORazepam (ATIVAN) tablet 1 mg  1 mg Oral QID Sindy Guadeloupe, NP   1 mg at 08/22/23 4098   Followed by   Melene Muller ON 08/23/2023] LORazepam (ATIVAN) tablet 1 mg  1 mg Oral TID Sindy Guadeloupe, NP       Followed by   Melene Muller ON 08/24/2023] LORazepam (ATIVAN) tablet 1 mg  1 mg Oral BID Sindy Guadeloupe, NP       Followed by   Melene Muller ON 08/25/2023] LORazepam (ATIVAN) tablet 1 mg  1 mg Oral Daily Sindy Guadeloupe, NP       magnesium hydroxide (MILK OF MAGNESIA) suspension 30 mL  30 mL Oral Daily PRN Sindy Guadeloupe, NP  multivitamin with minerals tablet 1 tablet  1 tablet Oral Daily Sindy Guadeloupe, NP   1 tablet at 08/22/23 0929   OLANZapine (ZYPREXA) injection 10 mg  10 mg Intramuscular TID PRN Sindy Guadeloupe, NP       OLANZapine [facility]) injection 5 mg  5 mg Intramuscular TID PRN Sindy Guadeloupe, NP       OLANZapine zydis (ZYPREXA) disintegrating tablet 5 mg  5 mg Oral TID PRN Sindy Guadeloupe, NP       ondansetron (ZOFRAN-ODT) disintegrating tablet 4 mg  4 mg Oral Q6H PRN Sindy Guadeloupe, NP       Melene Muller ON 08/23/2023] thiamine (VITAMIN B1) tablet 100 mg  100 mg Oral Daily Sindy Guadeloupe, NP       No current outpatient medications on file.    PTA Medications:  Facility Ordered Medications  Medication    acetaminophen (TYLENOL) tablet 650 mg   alum & mag hydroxide-simeth (MAALOX/MYLANTA) 200-200-20 MG/5ML suspension 30 mL   magnesium hydroxide (MILK OF MAGNESIA) suspension 30 mL   [COMPLETED] thiamine (VITAMIN B1) injection 100 mg   [START ON 08/23/2023] thiamine (VITAMIN B1) tablet 100 mg   multivitamin with minerals tablet 1 tablet   LORazepam (ATIVAN) tablet 1 mg   hydrOXYzine (ATARAX) tablet 25 mg   loperamide (IMODIUM) capsule 2-4 mg   ondansetron (ZOFRAN-ODT) disintegrating tablet 4 mg   OLANZapine zydis (ZYPREXA) disintegrating tablet 5 mg   OLANZapine (ZYPREXA) injection 5 mg   OLANZapine (ZYPREXA) injection 10 mg   LORazepam (ATIVAN) tablet 1 mg   Followed by   Melene Muller ON 08/23/2023] LORazepam (ATIVAN) tablet 1 mg   Followed by   Melene Muller ON 08/24/2023] LORazepam (ATIVAN) tablet 1 mg   Followed by   Melene Muller ON 08/25/2023] LORazepam (ATIVAN) tablet 1 mg       06/27/2023   10:32 AM 06/27/2023    8:29 AM 06/26/2023   11:17 AM  Depression screen PHQ 2/9  Decreased Interest 0 0 0  Down, Depressed, Hopeless 0 0 0  PHQ - 2 Score 0 0 0  Altered sleeping  0 1  Tired, decreased energy  0 1  Change in appetite  0 0  Feeling bad or failure about yourself   0 1  Trouble concentrating  0 0  Moving slowly or fidgety/restless  0 0  Suicidal thoughts  0 0  PHQ-9 Score  0 3  Difficult doing work/chores   Somewhat difficult    Flowsheet Row ED from 08/22/2023 in [facility] ED from 08/16/2023 in [facility] Emergency Department at [facility] ED from 07/31/2023 in [facility]  C-SSRS RISK CATEGORY No Risk No Risk Error: Q3, 4, or 5 should not be populated when Q2 is No       Musculoskeletal  Strength & Muscle Tone: within normal limits Gait & Station: normal Patient leans: N/A  Psychiatric Specialty Exam  Presentation  General Appearance:  Disheveled  Eye Contact: Fair  Speech: Clear and Coherent  Speech  Volume: Normal  Handedness: Right   Mood and Affect  Mood: Euthymic  Affect: Congruent   Thought Process  Thought Processes: Coherent; Goal Directed  Descriptions of Associations:Intact  Orientation:Full (Time, Place and Person)  Thought Content:Logical  Diagnosis of Schizophrenia or Schizoaffective disorder in past: No    Hallucinations:Hallucinations: None  Ideas of Reference:None  Suicidal Thoughts:Suicidal Thoughts: No  Homicidal Thoughts:Homicidal Thoughts: No   Sensorium  Memory: Immediate Fair; Recent Fair; Remote Fair  Judgment:  Fair  Insight: Corporate treasurer: Fair  Attention Span: Fair  Recall: [employer] of Knowledge: Fair  Language: Fair   Psychomotor Activity  Psychomotor Activity: Psychomotor Activity: Normal   Assets  Assets: Manufacturing systems engineer; Desire for Improvement; Physical Health; Leisure Time   Sleep  Sleep: Sleep: Fair Number of Hours of Sleep: 8   Nutritional Assessment (For OBS and FBC admissions only) Has the patient had a weight loss or gain of 10 pounds or more in the last 3 months?: No Has the patient had a decrease in food intake/or appetite?: No Does the patient have dental problems?: No Does the patient have eating habits or behaviors that may be indicators of an eating disorder including binging or inducing vomiting?: No Has the patient recently lost weight without trying?: 0 Has the patient been eating poorly because of a decreased appetite?: 0 Malnutrition Screening Tool Score: 0    Physical Exam  Physical Exam HENT:     Nose: Nose normal.  Cardiovascular:     Rate and Rhythm: Normal rate.  Pulmonary:     Effort: Pulmonary effort is normal.  Musculoskeletal:        General: Normal range of motion.     Cervical back: Normal range of motion.  Neurological:     Mental Status: He is alert and oriented to person, place, and time.    Review of Systems   Constitutional: Negative.   HENT: Negative.    Eyes: Negative.   Respiratory: Negative.    Cardiovascular: Negative.   Gastrointestinal: Negative.   Genitourinary: Negative.   Musculoskeletal: Negative.   Neurological:  Positive for headaches.  Endo/Heme/Allergies: Negative.   Psychiatric/Behavioral:  Positive for substance abuse.    Blood pressure (!) 103/54, pulse 84, temperature 98.4 F (36.9 C), temperature source Oral, resp. rate 19, SpO2 97%. There is no height or weight on file to calculate BMI.   Plan Of Care/Follow-up recommendations:  Patient is recommended for [facility] for substance abuse treatment. Patient is interested in long-term residential substance abuse treatment.   Disposition: Patient to transfer to the [facility] today for inpatient treatment. Patient is voluntary. Admission orders placed for [facility]. Labs , orders and vital signs reviewed.   Silvano Garofano L, NP 08/22/2023, 10:47 AM

## 2023-08-22 NOTE — Group Note (Signed)
 Group Topic: Wellness  Group Date: 08/22/2023 Start Time: 1145 End Time: 1215 Facilitators: Vonzell Schlatter B  Department: [facility]  Number of Participants: 6  Group Focus: activities of daily living skills and daily focus Treatment Modality:  Psychoeducation Interventions utilized were patient education and problem solving Purpose: regain self-worth and reinforce self-care  Name: Joseph Reese Date of Birth: 21-Feb-1966  MR: [id number]    Level of Participation: Pt was not on the unit during group time Quality of Participation:  Interactions with others:  Mood/Affect:  Triggers (if applicable):  Cognition:  Progress:  Response:  Plan: follow-up needed  Patients Problems:  Patient Active Problem List   Diagnosis Date Noted   Substance use disorder 08/22/2023   Cocaine abuse with cocaine-induced mood disorder (HCC) 06/27/2023   Alcohol use disorder, severe, in controlled environment (HCC) 06/27/2023   MDD (major depressive disorder), recurrent episode, severe (HCC) 06/27/2023   Substance abuse (HCC) 06/23/2023   Acute respiratory failure with hypoxia (HCC) 05/06/2021   Acute pulmonary edema (HCC) 05/06/2021   Hypertensive emergency

## 2023-08-22 NOTE — ED Notes (Signed)
 Patient is in the dayroom calm and composed, watching TV with other patients. NAD, Respirations are even and unlabored. Will monitor for safety.

## 2023-08-22 NOTE — Group Note (Signed)
 Group Topic: Social Support  Group Date: 08/22/2023 Start Time: 1500 End Time: 1515 Facilitators: Alezandra Egli, Jacklynn Barnacle, RN  Department: [facility]  Number of Participants: 7  Group Focus: check in Treatment Modality:  Individual Therapy Interventions utilized were support Purpose: express feelings  Name: Joseph Reese Date of Birth: 22-Jun-1965  MR: [id number]    Individual group held after documentation time   Level of Participation: moderate Quality of Participation: attentive and cooperative Interactions with others: gave feedback Mood/Affect: appropriate and flat Triggers (if applicable): None identified Cognition: coherent/clear and logical Progress: Gaining insight Response: Patient admitted and oriented to unit. Patient has no complaints at this time, mood pleasant with a positive outlook Plan: patient will be encouraged to continue to attend programming in the [place], reach out to staff as needed with concerns  Patients Problems:  Patient Active Problem List   Diagnosis Date Noted   Substance use disorder 08/22/2023   Cocaine abuse with cocaine-induced mood disorder (HCC) 06/27/2023   Alcohol use disorder, severe, in controlled environment (HCC) 06/27/2023   MDD (major depressive disorder), recurrent episode, severe (HCC) 06/27/2023   Substance abuse (HCC) 06/23/2023   Acute respiratory failure with hypoxia (HCC) 05/06/2021   Acute pulmonary edema (HCC) 05/06/2021   Hypertensive emergency

## 2023-08-22 NOTE — ED Notes (Signed)
 Patient transferred from [facility] to [facility] requesting assistance in detox from ETOH. Calm, cooperative throughout interview process. Skin assessment completed. Patient alert & oriented x4. Denies intent to harm self or others when asked. Denies A/VH. Patient denies any physical complaints when asked. No acute distress noted. Support and encouragement provided. Oriented to unit. Meal and drink offered. Patient denies SI/HI/AVH. Patient verbally contract for safety. Safety checks in place according to facility policy. No complaints from patient at this time.

## 2023-08-22 NOTE — ED Notes (Signed)
 Pt observed lying in bed eyes closed respirations even and non labored  NAD

## 2023-08-22 NOTE — Progress Notes (Signed)
 [facility] Bed assignment: [facility]  Pt meets inpatient criteria per: Liborio Nixon   Attending Physician will be: Dr. Marval Regal   Report can be called to: [phone]  Pt can arrive after Tonight second shift   Care Team Notified: [facility] Malva Limes RN, [place] RN, Nelva Bush RN, [place] Ward RN,   [place] Briella Hobday LCSW-A   08/22/2023 10:37 AM

## 2023-08-22 NOTE — ED Notes (Signed)
 Patient A&Ox4. Patient present for substance abuse. Patient currently denies SI/HI and AVH.  Patient denies any physical complaints when asked. No acute distress noted. Support and encouragement provided. Routine safety checks conducted according to facility protocol. Encouraged patient to notify staff if thoughts of harm toward self or others arise. Patient verbalize understanding and agreement. Will continue to monitor for safety.

## 2023-08-22 NOTE — Discharge Instructions (Signed)
 Transfer to [facility] today.

## 2023-08-22 NOTE — Group Note (Signed)
 Group Topic: Social Support  Group Date: 08/22/2023 Start Time: 2000 End Time: 2030 Facilitators: Guss Bunde  Department: [facility]  Number of Participants: 5  Group Focus: check in Treatment Modality:  Leisure Development Interventions utilized were support Purpose: increase insight  Name: Joseph Reese Date of Birth: 1965-11-10  MR: [id number]    Level of Participation: active Quality of Participation: attentive Interactions with others: gave feedback Mood/Affect: appropriate Triggers (if applicable):  Cognition: goal directed Progress: Gaining insight Response: Pt wants to go to 30 day treatment center Plan: patient will be encouraged to follow up with goals  Patients Problems:  Patient Active Problem List   Diagnosis Date Noted   Substance use disorder 08/22/2023   Cocaine abuse with cocaine-induced mood disorder (HCC) 06/27/2023   Alcohol use disorder, severe, in controlled environment (HCC) 06/27/2023   MDD (major depressive disorder), recurrent episode, severe (HCC) 06/27/2023   Substance abuse (HCC) 06/23/2023   Acute respiratory failure with hypoxia (HCC) 05/06/2021   Acute pulmonary edema (HCC) 05/06/2021   Hypertensive emergency

## 2023-08-22 NOTE — ED Provider Notes (Signed)
 [facility] Continuous Assessment Admission H&P  Date: 08/22/23 Patient Name: Joseph Reese MRN: [id number] Chief Complaint: been drinking and using cocaine   Diagnoses:  Final diagnoses:  Alcohol abuse  Cocaine abuse (HCC)  H/O medication noncompliance    HPI: Joseph Reese, 58 y/o male with a history of alcohol abuse,  and polysubstance abuse (cocaine),  presented to [facility] voluntarily.  Per the patient he has been drinking and doing crack cocaine a lot and he does need a place where he can get away for a long time because where he is living at the end up doing more drugs.  I review of patient records show that patient has been seen multiple times for alcohol and cocaine usage patient recently was discharged from [facility] treatment center after spending 30 days there.  According to patient he has been drinking alcohol when asked how much patient stated around 1/5 of daily use and crack cocaine every day. Per the patient he is currently not seeing a psychiatrist or therapist has not been following up with any of his discharge instructions from [facility] treatment center.  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   Face-to-face observation of patient, patient is alert and oriented x 4, speech is clear, maintain eye contact.  Patient had to be redirected multiple times because when asked a question patient would get agitated and answer the question in a sarcastic way.  Patient denies SI, HI, AVH or paranoia.  Patient endorsed alcohol consumption stating that he drinks every day he has been drinking for the past 35 years.  Has tried multiple detox program and relapsed for time.  Patient also reports he use crack cocaine on a daily basis and whenever he can get his hands on.  Patient does appear to be homeless because he stated that he could not go back to his daughter's house.  Writer discussed with patient that we would admit him for detox however he probably need to get into a long-term  treatment facility after discharge.  Patient is also in agreement with that.  Writer also talked to TTS to see if they can arrange getting patient back to [facility] treatment center where he can have a longer stay.  Recommend observation with possible [facility] when a bed becomes available.  Total Time spent with patient: 20 minutes  Musculoskeletal  Strength & Muscle Tone: within normal limits Gait & Station: normal Patient leans: N/A  Psychiatric Specialty Exam  Presentation General Appearance:  Casual  Eye Contact: Good  Speech: Clear and Coherent  Speech Volume: Normal  Handedness: Right   Mood and Affect  Mood: Anxious  Affect: Congruent   Thought Process  Thought Processes: Coherent  Descriptions of Associations:Intact  Orientation:Full (Time, Place and Person)  Thought Content:Logical  Diagnosis of Schizophrenia or Schizoaffective disorder in past: No   Hallucinations:Hallucinations: None  Ideas of Reference:None  Suicidal Thoughts:Suicidal Thoughts: No  Homicidal Thoughts:Homicidal Thoughts: No   Sensorium  Memory: Immediate Good  Judgment: Poor  Insight: Fair   Art therapist  Concentration: Fair  Attention Span: Fair  Recall: Good  Fund of Knowledge: Good  Language: Good   Psychomotor Activity  Psychomotor Activity: Psychomotor Activity: Normal   Assets  Assets: Desire for Improvement; Housing; Resilience; Vocational/Educational   Sleep  Sleep: Sleep: Fair Number of Hours of Sleep: 8   Nutritional Assessment (For OBS and FBC admissions only) Has the patient had a weight loss or gain of 10 pounds or more in the last 3  months?: No Has the patient had a decrease in food intake/or appetite?: No Does the patient have dental problems?: No Does the patient have eating habits or behaviors that may be indicators of an eating disorder including binging or inducing vomiting?: No Has the patient recently lost  weight without trying?: 0 Has the patient been eating poorly because of a decreased appetite?: 0 Malnutrition Screening Tool Score: 0    Physical Exam HENT:     Head: Normocephalic.     Nose: Nose normal.  Eyes:     Pupils: Pupils are equal, round, and reactive to light.  Cardiovascular:     Rate and Rhythm: Normal rate.  Pulmonary:     Effort: Pulmonary effort is normal.  Musculoskeletal:        General: Normal range of motion.     Cervical back: Normal range of motion.  Neurological:     General: No focal deficit present.     Mental Status: He is alert.  Psychiatric:        Mood and Affect: Mood normal.        Behavior: Behavior normal.        Thought Content: Thought content normal.        Judgment: Judgment normal.    Review of Systems  Constitutional: Negative.   HENT: Negative.    Eyes: Negative.   Respiratory: Negative.    Cardiovascular: Negative.   Gastrointestinal: Negative.   Genitourinary: Negative.   Musculoskeletal: Negative.   Skin: Negative.   Neurological: Negative.   Psychiatric/Behavioral:  Positive for depression and substance abuse. The patient is nervous/anxious.     Blood pressure (!) 153/99, pulse 88, temperature 98.4 F (36.9 C), temperature source Oral, resp. rate 20, SpO2 99%. There is no height or weight on file to calculate BMI.  Past Psychiatric History: alcohol abuse,  polysubstance abuse,     Is the patient at risk to self? No  Has the patient been a risk to self in the past 6 months? No .    Has the patient been a risk to self within the distant past? Yes   Is the patient a risk to others? No   Has the patient been a risk to others in the past 6 months? No   Has the patient been a risk to others within the distant past? No   Past Medical History: see chart   Family History: unknown   Social History: alcohol , cocaine  Last Labs:  Admission on 08/16/2023, Discharged on 08/17/2023  Component Date Value Ref Range Status    Sodium 08/16/2023 138  135 - 145 mmol/L Final   Potassium 08/16/2023 3.6  3.5 - 5.1 mmol/L Final   Chloride 08/16/2023 108  98 - 111 mmol/L Final   CO2 08/16/2023 22  22 - 32 mmol/L Final   Glucose, Bld 08/16/2023 82  70 - 99 mg/dL Final   Glucose reference range applies only to samples taken after fasting for at least 8 hours.   BUN 08/16/2023 9  6 - 20 mg/dL Final   Creatinine, Ser 08/16/2023 0.86  0.61 - 1.24 mg/dL Final   Calcium 40/34/7425 9.3  8.9 - 10.3 mg/dL Final   GFR, Estimated 08/16/2023 >60  >60 mL/min Final   Comment: (NOTE) Calculated using the CKD-EPI Creatinine Equation (2021)    Anion gap 08/16/2023 8  5 - 15 Final   Performed at [facility], [address] [place], [place] [address]   WBC 08/16/2023  5.7  4.0 - 10.5 K/uL Final   RBC 08/16/2023 4.68  4.22 - 5.81 MIL/uL Final   Hemoglobin 08/16/2023 13.1  13.0 - 17.0 g/dL Final   HCT 47/82/9562 38.4 (L)  39.0 - 52.0 % Final   MCV 08/16/2023 82.1  80.0 - 100.0 fL Final   MCH 08/16/2023 28.0  26.0 - 34.0 pg Final   MCHC 08/16/2023 34.1  30.0 - 36.0 g/dL Final   RDW 13/12/6576 14.3  11.5 - 15.5 % Final   Platelets 08/16/2023 276  150 - 400 K/uL Final   nRBC 08/16/2023 0.0  0.0 - 0.2 % Final   Performed at [facility], [address] [place], [place] [address]   Troponin I (High Sensitivity) 08/16/2023 21 (H)  <18 ng/L Final   Comment: (NOTE) Elevated high sensitivity troponin I (hsTnI) values and significant  changes across serial measurements may suggest ACS but many other  chronic and acute conditions are known to elevate hsTnI results.  Refer to the "Links" section for chest pain algorithms and additional  guidance. Performed at [facility], [address] [place], [place] [address]    Troponin I (High Sensitivity) 08/16/2023 20 (H)  <18 ng/L Final   Comment: (NOTE) Elevated high sensitivity troponin I (hsTnI) values and significant  changes across serial measurements may suggest ACS but  many other  chronic and acute conditions are known to elevate hsTnI results.  Refer to the "Links" section for chest pain algorithms and additional  guidance. Performed at [facility], [address] [place], [place] [address]   Admission on 06/27/2023, Discharged on 06/29/2023  Component Date Value Ref Range Status   POC Amphetamine UR 06/27/2023 None Detected  NONE DETECTED (Cut Off Level 1000 ng/mL) Final   POC Secobarbital (BAR) 06/27/2023 None Detected  NONE DETECTED (Cut Off Level 300 ng/mL) Final   POC Buprenorphine (BUP) 06/27/2023 None Detected  NONE DETECTED (Cut Off Level 10 ng/mL) Final   POC Oxazepam (BZO) 06/27/2023 None Detected  NONE DETECTED (Cut Off Level 300 ng/mL) Final   POC Cocaine UR 06/27/2023 None Detected  NONE DETECTED (Cut Off Level 300 ng/mL) Final   POC Methamphetamine UR 06/27/2023 None Detected  NONE DETECTED (Cut Off Level 1000 ng/mL) Final   POC Morphine 06/27/2023 None Detected  NONE DETECTED (Cut Off Level 300 ng/mL) Final   POC Methadone UR 06/27/2023 None Detected  NONE DETECTED (Cut Off Level 300 ng/mL) Final   POC Oxycodone UR 06/27/2023 None Detected  NONE DETECTED (Cut Off Level 100 ng/mL) Final   POC Marijuana UR 06/27/2023 Positive (A)  NONE DETECTED (Cut Off Level 50 ng/mL) Final  Admission on 06/23/2023, Discharged on 06/27/2023  Component Date Value Ref Range Status   WBC 06/23/2023 4.4  4.0 - 10.5 K/uL Final   RBC 06/23/2023 4.93  4.22 - 5.81 MIL/uL Final   Hemoglobin 06/23/2023 14.2  13.0 - 17.0 g/dL Final   HCT 05/16/7251 41.6  39.0 - 52.0 % Final   MCV 06/23/2023 84.4  80.0 - 100.0 fL Final   MCH 06/23/2023 28.8  26.0 - 34.0 pg Final   MCHC 06/23/2023 34.1  30.0 - 36.0 g/dL Final   RDW 66/44/0347 14.2  11.5 - 15.5 % Final   Platelets 06/23/2023 224  150 - 400 K/uL Final   nRBC 06/23/2023 0.0  0.0 - 0.2 % Final   Neutrophils Relative % 06/23/2023 38  % Final   Neutro Abs 06/23/2023 1.7  1.7 -  7.7 K/uL Final   Lymphocytes  Relative 06/23/2023 43  % Final   Lymphs Abs 06/23/2023 1.9  0.7 - 4.0 K/uL Final   Monocytes Relative 06/23/2023 12  % Final   Monocytes Absolute 06/23/2023 0.5  0.1 - 1.0 K/uL Final   Eosinophils Relative 06/23/2023 6  % Final   Eosinophils Absolute 06/23/2023 0.3  0.0 - 0.5 K/uL Final   Basophils Relative 06/23/2023 0  % Final   Basophils Absolute 06/23/2023 0.0  0.0 - 0.1 K/uL Final   Immature Granulocytes 06/23/2023 1  % Final   Abs Immature Granulocytes 06/23/2023 0.02  0.00 - 0.07 K/uL Final   Performed at [facility], [address], [place], [place] [address]   Sodium 06/23/2023 139  135 - 145 mmol/L Final   Potassium 06/23/2023 3.7  3.5 - 5.1 mmol/L Final   Chloride 06/23/2023 107  98 - 111 mmol/L Final   CO2 06/23/2023 24  22 - 32 mmol/L Final   Glucose, Bld 06/23/2023 112 (H)  70 - 99 mg/dL Final   Glucose reference range applies only to samples taken after fasting for at least 8 hours.   BUN 06/23/2023 9  6 - 20 mg/dL Final   Creatinine, Ser 06/23/2023 0.88  0.61 - 1.24 mg/dL Final   Calcium 60/45/4098 9.3  8.9 - 10.3 mg/dL Final   Total Protein 11/91/4782 6.8  6.5 - 8.1 g/dL Final   Albumin 95/62/1308 3.6  3.5 - 5.0 g/dL Final   AST 65/78/4696 22  15 - 41 U/L Final   ALT 06/23/2023 16  0 - 44 U/L Final   Alkaline Phosphatase 06/23/2023 54  38 - 126 U/L Final   Total Bilirubin 06/23/2023 0.6  0.0 - 1.2 mg/dL Final   GFR, Estimated 06/23/2023 >60  >60 mL/min Final   Comment: (NOTE) Calculated using the CKD-EPI Creatinine Equation (2021)    Anion gap 06/23/2023 8  5 - 15 Final   Performed at [facility], [address], [place], [place] [address]   Hgb A1c MFr Bld 06/23/2023 5.1  4.8 - 5.6 % Final   Comment: (NOTE) Pre diabetes:          5.7%-6.4%  Diabetes:              >6.4%  Glycemic control for   <7.0% adults with diabetes    Mean Plasma Glucose 06/23/2023 99.67  mg/dL Final   Performed at [facility], [address] [place], [place]  [address]   Alcohol, Ethyl (B) 06/23/2023 <10  <10 mg/dL Final   Comment: (NOTE) Lowest detectable limit for serum alcohol is 10 mg/dL.  For medical purposes only. Performed at [facility], [address] [place], [place] [address]    POC Amphetamine UR 06/23/2023 None Detected  NONE DETECTED (Cut Off Level 1000 ng/mL) Final   POC Secobarbital (BAR) 06/23/2023 None Detected  NONE DETECTED (Cut Off Level 300 ng/mL) Final   POC Buprenorphine (BUP) 06/23/2023 None Detected  NONE DETECTED (Cut Off Level 10 ng/mL) Final   POC Oxazepam (BZO) 06/23/2023 None Detected  NONE DETECTED (Cut Off Level 300 ng/mL) Final   POC Cocaine UR 06/23/2023 Positive (A)  NONE DETECTED (Cut Off Level 300 ng/mL) Final   POC Methamphetamine UR 06/23/2023 None Detected  NONE DETECTED (Cut Off Level 1000 ng/mL) Final   POC Morphine 06/23/2023 None Detected  NONE DETECTED (Cut Off Level 300 ng/mL) Final   POC Methadone UR 06/23/2023  None Detected  NONE DETECTED (Cut Off Level 300 ng/mL) Final   POC Oxycodone UR 06/23/2023 None Detected  NONE DETECTED (Cut Off Level 100 ng/mL) Final   POC Marijuana UR 06/23/2023 Positive (A)  NONE DETECTED (Cut Off Level 50 ng/mL) Final   Cholesterol 06/23/2023 192  0 - 200 mg/dL Final   Triglycerides 16/02/9603 46  <150 mg/dL Final   HDL 54/01/8118 76  >40 mg/dL Final   Total CHOL/HDL Ratio 06/23/2023 2.5  RATIO Final   VLDL 06/23/2023 9  0 - 40 mg/dL Final   LDL Cholesterol 06/23/2023 107 (H)  0 - 99 mg/dL Final   Comment:        Total Cholesterol/HDL:CHD Risk Coronary Heart Disease Risk Table                     Men   Women  1/2 Average Risk   3.4   3.3  Average Risk       5.0   4.4  2 X Average Risk   9.6   7.1  3 X Average Risk  23.4   11.0        Use the calculated Patient Ratio above and the CHD Risk Table to determine the patient's CHD Risk.        ATP III CLASSIFICATION (LDL):  <100     mg/dL   Optimal  147-829  mg/dL   Near or Above                     Optimal  130-159  mg/dL   Borderline  562-130  mg/dL   High  >865     mg/dL   Very High Performed at [facility], [address] [place], [place] [address]    TSH 06/23/2023 1.229  0.350 - 4.500 uIU/mL Final   Comment: Performed by a 3rd Generation assay with a functional sensitivity of <=0.01 uIU/mL. Performed at [facility], [address], [place], [place] [address]   Admission on 06/20/2023, Discharged on 06/20/2023  Component Date Value Ref Range Status   Sodium 06/20/2023 134 (L)  135 - 145 mmol/L Final   Potassium 06/20/2023 3.9  3.5 - 5.1 mmol/L Final   Chloride 06/20/2023 100  98 - 111 mmol/L Final   CO2 06/20/2023 22  22 - 32 mmol/L Final   Glucose, Bld 06/20/2023 77  70 - 99 mg/dL Final   Glucose reference range applies only to samples taken after fasting for at least 8 hours.   BUN 06/20/2023 8  6 - 20 mg/dL Final   Creatinine, Ser 06/20/2023 0.85  0.61 - 1.24 mg/dL Final   Calcium 84/13/2440 9.0  8.9 - 10.3 mg/dL Final   GFR, Estimated 06/20/2023 >60  >60 mL/min Final   Comment: (NOTE) Calculated using the CKD-EPI Creatinine Equation (2021)    Anion gap 06/20/2023 12  5 - 15 Final   Performed at [facility], [address], [place], [place] [address]   WBC 06/20/2023 5.0  4.0 - 10.5 K/uL Final   RBC 06/20/2023 5.19  4.22 - 5.81 MIL/uL Final   Hemoglobin 06/20/2023 15.2  13.0 - 17.0 g/dL Final   HCT 53/66/4403 43.9  39.0 - 52.0 % Final   MCV 06/20/2023 84.6  80.0 - 100.0 fL Final   MCH 06/20/2023 29.3  26.0 - 34.0 pg Final   MCHC 06/20/2023 34.6  30.0 - 36.0 g/dL Final   RDW 47/42/5956  14.1  11.5 - 15.5 % Final   Platelets 06/20/2023 239  150 - 400 K/uL Final   nRBC 06/20/2023 0.0  0.0 - 0.2 % Final   Performed at [facility], [address] [place], [place] [address]   Troponin I (High Sensitivity) 06/20/2023 21 (H)  <18 ng/L Final   Comment: (NOTE) Elevated high sensitivity troponin I (hsTnI) values and significant  changes across  serial measurements may suggest ACS but many other  chronic and acute conditions are known to elevate hsTnI results.  Refer to the "Links" section for chest pain algorithms and additional  guidance. Performed at [facility], [address], [place], [place] [address]    B Natriuretic Peptide 06/20/2023 54.9  0.0 - 100.0 pg/mL Final   Performed at [facility], [address], [place], [place] [address]   Troponin I (High Sensitivity) 06/20/2023 19 (H)  <18 ng/L Final   Comment: (NOTE) Elevated high sensitivity troponin I (hsTnI) values and significant  changes across serial measurements may suggest ACS but many other  chronic and acute conditions are known to elevate hsTnI results.  Refer to the "Links" section for chest pain algorithms and additional  guidance. Performed at [facility], [address], [place], [place] [address]   Admission on 06/17/2023, Discharged on 06/18/2023  Component Date Value Ref Range Status   Sodium 06/17/2023 137  135 - 145 mmol/L Final   Potassium 06/17/2023 3.4 (L)  3.5 - 5.1 mmol/L Final   Chloride 06/17/2023 102  98 - 111 mmol/L Final   CO2 06/17/2023 23  22 - 32 mmol/L Final   Glucose, Bld 06/17/2023 84  70 - 99 mg/dL Final   Glucose reference range applies only to samples taken after fasting for at least 8 hours.   BUN 06/17/2023 5 (L)  6 - 20 mg/dL Final   Creatinine, Ser 06/17/2023 1.01  0.61 - 1.24 mg/dL Final   Calcium 29/56/2130 8.9  8.9 - 10.3 mg/dL Final   GFR, Estimated 06/17/2023 >60  >60 mL/min Final   Comment: (NOTE) Calculated using the CKD-EPI Creatinine Equation (2021)    Anion gap 06/17/2023 12  5 - 15 Final   Performed at [facility], [address], [place], [place] [address]   WBC 06/17/2023 5.0  4.0 - 10.5 K/uL Final   RBC 06/17/2023 4.63  4.22 - 5.81 MIL/uL Final   Hemoglobin 06/17/2023 13.4  13.0 - 17.0 g/dL Final   HCT 46/96/2952 39.7  39.0 - 52.0 % Final   MCV 06/17/2023 85.7  80.0 - 100.0 fL  Final   MCH 06/17/2023 28.9  26.0 - 34.0 pg Final   MCHC 06/17/2023 33.8  30.0 - 36.0 g/dL Final   RDW 84/13/2440 14.6  11.5 - 15.5 % Final   Platelets 06/17/2023 234  150 - 400 K/uL Final   nRBC 06/17/2023 0.0  0.0 - 0.2 % Final   Performed at [facility], [address] [place], [place] [address]   Troponin I (High Sensitivity) 06/17/2023 36 (H)  <18 ng/L Final   Comment: (NOTE) Elevated high sensitivity troponin I (hsTnI) values and significant  changes across serial measurements may suggest ACS but many other  chronic and acute conditions are known to elevate hsTnI results.  Refer to the "Links" section for chest pain algorithms and additional  guidance. Performed at [facility], [address] [place], [place] [address]  B Natriuretic Peptide 06/17/2023 185.1 (H)  0.0 - 100.0 pg/mL Final   Performed at [facility], [address], [place], [place] [address]   SARS Coronavirus 2 by RT PCR 06/17/2023 NEGATIVE  NEGATIVE Final   Influenza A by PCR 06/17/2023 NEGATIVE  NEGATIVE Final   Influenza B by PCR 06/17/2023 NEGATIVE  NEGATIVE Final   Comment: (NOTE) The Xpert Xpress SARS-CoV-2/FLU/RSV plus assay is intended as an aid in the diagnosis of influenza from Nasopharyngeal swab specimens and should not be used as a sole basis for treatment. Nasal washings and aspirates are unacceptable for Xpert Xpress SARS-CoV-2/FLU/RSV testing.  Fact Sheet for Patients: BloggerCourse.com  Fact Sheet for Healthcare Providers: SeriousBroker.it  This test is not yet approved or cleared by the [place] FDA and has been authorized for detection and/or diagnosis of SARS-CoV-2 by FDA under an Emergency Use Authorization (EUA). This EUA will remain in effect (meaning this test can be used) for the duration of the COVID-19 declaration under Section 564(b)(1) of the Act, 21 U.S.C. section 360bbb-3(b)(1), unless the  authorization is terminated or revoked.     Resp Syncytial Virus by PCR 06/17/2023 NEGATIVE  NEGATIVE Final   Comment: (NOTE) Fact Sheet for Patients: BloggerCourse.com  Fact Sheet for Healthcare Providers: SeriousBroker.it  This test is not yet approved or cleared by the [place] FDA and has been authorized for detection and/or diagnosis of SARS-CoV-2 by FDA under an Emergency Use Authorization (EUA). This EUA will remain in effect (meaning this test can be used) for the duration of the COVID-19 declaration under Section 564(b)(1) of the Act, 21 U.S.C. section 360bbb-3(b)(1), unless the authorization is terminated or revoked.  Performed at [facility], [address], [place], [place] [address]    Troponin I (High Sensitivity) 06/17/2023 37 (H)  <18 ng/L Final   Comment: (NOTE) Elevated high sensitivity troponin I (hsTnI) values and significant  changes across serial measurements may suggest ACS but many other  chronic and acute conditions are known to elevate hsTnI results.  Refer to the "Links" section for chest pain algorithms and additional  guidance. Performed at [facility], [address] [place], [place] [address]     Allergies: Patient has no known allergies.  Medications:  PTA Medications  Medication Sig   atorvastatin (LIPITOR) 80 MG tablet Take 1 tablet (80 mg total) by mouth daily.   carvedilol (COREG) 12.5 MG tablet Take 1 tablet (12.5 mg total) by mouth 2 (two) times daily with a meal.   furosemide (LASIX) 40 MG tablet Take 1 tablet (40 mg total) by mouth as needed for fluid or edema.   sacubitril-valsartan (ENTRESTO) 24-26 MG Take 1 tablet by mouth 2 (two) times daily.   hydrOXYzine (ATARAX) 25 MG tablet Take 1 tablet (25 mg total) by mouth every 6 (six) hours as needed for anxiety.   traZODone (DESYREL) 50 MG tablet Take 1 tablet (50 mg total) by mouth at bedtime as needed for sleep.    empagliflozin (JARDIANCE) 10 MG TABS tablet Take 1 tablet (10 mg total) by mouth daily before breakfast.   lidocaine (LIDODERM) 5 % Place 1 patch onto the skin daily. Remove & Discard patch within 12 hours or as directed by MD      Medical Decision Making  Observation unit    Recommendations  Based on my evaluation the patient does not appear to have an emergency medical condition.  Sindy Guadeloupe, NP 08/22/23  2:41 AM

## 2023-08-22 NOTE — ED Notes (Signed)
 Pt is in the bedroom sleeping comfortably with eyes closed. NAD. Respirations even and unlabored. Will keep monitoring for safety

## 2023-08-22 NOTE — ED Notes (Signed)
 Pt presented sitting in chair eating breakfast. Guarded upon approach  evasive.  States he is agreeable to transfer to [facility].  Denied current SI plan and intent

## 2023-08-22 NOTE — Progress Notes (Signed)
   08/22/23 0136  [facility] Triage Screening (Walk-ins at [facility] only)  How Did You Hear About [place]? Self  What Is the Reason for Your Visit/Call Today? Pt presents to [facility] as a voluntary walk-in, unaccompanied with complaint of depression, SI with no plan/intent and substance abuse treatment/detox. Pt reports he was recently at [facility] and discharged, but relapsed the moment he returned home. Pt reports using cocaine and alcohol most of the day today. Pt reports that he often feels suicidal because he feels like a failure, but does not intend on hurting himself or taking his own life. Pt currently denies HI,AVH.  How Long Has This Been Causing You Problems? <Week  Have You Recently Had Any Thoughts About Hurting Yourself? Yes  How long ago did you have thoughts about hurting yourself? currently  Are You Planning to Commit Suicide/Harm Yourself At This time? No  Have you Recently Had Thoughts About Hurting Someone Karolee Ohs? No  Are You Planning To Harm Someone At This Time? No  Physical Abuse Denies  Verbal Abuse Denies  Sexual Abuse Denies  Exploitation of patient/patient's resources Denies  Self-Neglect Yes, present (Comment)  Are you currently experiencing any auditory, visual or other hallucinations? No  Have You Used Any Alcohol or Drugs in the Past 24 Hours? Yes  What Did You Use and How Much? gram of cocaine and fifth of liquor  Do you have any current medical co-morbidities that require immediate attention? No  Clinician description of patient physical appearance/behavior: Calm, cooperative, casually dressed  What Do You Feel Would Help You the Most Today? Alcohol or Drug Use Treatment;Treatment for Depression or other mood problem  If access to [facility] was not available, would you have sought care in the Emergency Department? Yes  Determination of Need Urgent (48 hours)  Options For Referral Other: Comment;Chemical Dependency Intensive Outpatient Therapy  (CDIOP);Medication Management;Facility-Based Crisis  Determination of Need filed? Yes

## 2023-08-22 NOTE — ED Notes (Signed)
 Abnormal BP of 140/100 obtained manually. Patient asymptomatic, in no acute distress. Provider Kizzie Ide, MD and Lewanda Rife, MD made aware. No new orders at this time.

## 2023-08-22 NOTE — BH Assessment (Signed)
 Comprehensive Clinical Assessment (CCA) Note   08/22/2023 Joseph Reese [id number]  Disposition: Sindy Guadeloupe, NP recommends continuous observation.   The patient demonstrates the following risk factors for suicide: Chronic risk factors for suicide include: substance use disorder. Acute risk factors for suicide include: recent discharge from rehab facility  Protective factors for this patient include: positive social support and responsibility to others (children, family). Considering these factors, the overall suicide risk at this point appears to be low. Patient is not appropriate for outpatient follow up.    Pt presents to [facility] as a voluntary walk-in, unaccompanied with complaint of depression, SI with no plan/intent and substance abuse treatment/detox. Pt reports he was recently at [facility] and discharged, but relapsed the moment he returned home. Pt reports using cocaine and alcohol most of the day today. Pt reports that he often feels suicidal because he feels like a failure, but does not intend on hurting himself or taking his own life. Pt currently denies HI,AVH.  On evaluation, patient is alert, oriented x 3, and cooperative. Speech is clear, coherent and logical. Pt appears casual. Eye contact is fair. Mood is anxious and depressed, affect is congruent with mood. Thought process is logical and thought content is coherent. Pt endorses SI without a plan. Pt denies HI/AVH. There is no indication that the patient is responding to internal stimuli. No delusions elicited during this assessment.     Chief Complaint:  Chief Complaint  Patient presents with   Depression   Addiction Problem   Visit Diagnosis: Alcohol abuse Cocaine abuse (HCC)      CCA Screening, Triage and Referral (STR)  Patient Reported Information How did you hear about [place]? Self  What Is the Reason for Your Visit/Call Today? Pt presents to [facility] as a voluntary walk-in, unaccompanied with complaint of  depression, SI with no plan/intent and substance abuse treatment/detox. Pt reports he was recently at [facility] and discharged, but relapsed the moment he returned home. Pt reports using cocaine and alcohol most of the day today. Pt reports that he often feels suicidal because he feels like a failure, but does not intend on hurting himself or taking his own life. Pt currently denies HI,AVH.  How Long Has This Been Causing You Problems? <Week  What Do You Feel Would Help You the Most Today? Alcohol or Drug Use Treatment; Treatment for Depression or other mood problem   Have You Recently Had Any Thoughts About Hurting Yourself? Yes  Are You Planning to Commit Suicide/Harm Yourself At This time? No   Flowsheet Row ED from 08/22/2023 in [facility] ED from 08/16/2023 in [facility] Emergency Department at [facility] ED from 07/31/2023 in [facility]  C-SSRS RISK CATEGORY No Risk No Risk Error: Q3, 4, or 5 should not be populated when Q2 is No       Have you Recently Had Thoughts About Hurting Someone Karolee Ohs? No  Are You Planning to Harm Someone at This Time? No  Explanation: Denies HI   Have You Used Any Alcohol or Drugs in the Past 24 Hours? Yes  How Long Ago Did You Use Drugs or Alcohol?"Hours ago What Did You Use and How Much? gram of cocaine and fifth of liquor   Do You Currently Have a Therapist/Psychiatrist? No  Name of Therapist/Psychiatrist:  n/a  Have You Been Recently Discharged From Any Office Practice or Programs? No  Explanation of Discharge From Practice/Program: n/a  CCA Screening Triage Referral Assessment Type of Contact: Face-to-Face  Telemedicine Service Delivery:   Is this Initial or Reassessment?   Date Telepsych consult ordered in [facility]:    Time Telepsych consult ordered in [facility]:    Location of Assessment: [facility] Assessment Services  Provider Location: [facility] Assessment  Services   Collateral Involvement: None   Does Patient Have a Automotive engineer Guardian? No  Legal Guardian Contact Information: n/a  Copy of Legal Guardianship Form: -- (n/a)  Legal Guardian Notified of Arrival: -- (n/a)  Legal Guardian Notified of Pending Discharge: -- (n/a)  If Minor and Not Living with Parent(s), Who has Custody? n/a  Is CPS involved or ever been involved? Never  Is APS involved or ever been involved? Never   Patient Determined To Be At Risk for Harm To Self or Others Based on Review of Patient Reported Information or Presenting Complaint? Yes, for Self-Harm  Method: No Plan  Availability of Means: No Intent: Vague intent or NA  Notification Required: No need or identified person  Additional Information for Danger to Others Potential: -- (n/a)  Additional Comments for Danger to Others Potential: n/a  Are There Guns or Other Weapons in Your Home? No  Types of Guns/Weapons: Denies access  Are These Weapons Safely Secured?                            Yes  Who Could Verify You Are Able To Have These Secured: Denies access  Do You Have any Outstanding Charges, Pending Court Dates, Parole/Probation? Pt denies pending legal charges  Contacted To Inform of Risk of Harm To Self or Others: -- (n/a)    Does Patient Present under Involuntary Commitment? No    [place] of Residence: [place]   Patient Currently Receiving the Following Services: Not Receiving Services   Determination of Need: Urgent (48 hours)   Options For Referral: Chemical Dependency Intensive Outpatient Therapy (CDIOP); Medication Management; Facility-Based Crisis     CCA Biopsychosocial Patient Reported Schizophrenia/Schizoaffective Diagnosis in Past: No   Strengths: Pt is motivated for treatment.   Mental Health Symptoms Depression:  Change in energy/activity; Fatigue; Hopelessness; Tearfulness; Worthlessness   Duration of Depressive symptoms: Duration of  Depressive Symptoms: Greater than two weeks   Mania:  None   Anxiety:   Worrying; Difficulty concentrating; Fatigue; Tension   Psychosis:  None   Duration of Psychotic symptoms:    Trauma:  None   Obsessions:  None   Compulsions:  None   Inattention:  None   Hyperactivity/Impulsivity:  None   Oppositional/Defiant Behaviors:  None   Emotional Irregularity:  None   Other Mood/Personality Symptoms:  None noted    Mental Status Exam Appearance and self-care  Stature:  Average   Weight:  Average weight   Clothing:  Casual   Grooming:  Neglected   Cosmetic use:  None   Posture/gait:  Normal   Motor activity:  Not Remarkable   Sensorium  Attention:  Normal   Concentration:  Normal   Orientation:  X5   Recall/memory:  Normal   Affect and Mood  Affect:  Depressed   Mood:  Depressed   Relating  Eye contact:  Normal   Facial expression:  Responsive   Attitude toward examiner:  Cooperative   Thought and Language  Speech flow: Normal   Thought content:  Appropriate to Mood and Circumstances   Preoccupation:  None   Hallucinations:  None  Organization:  Patent examiner of Knowledge:  Average   Intelligence:  Average   Abstraction:  Normal   Judgement:  Impaired   Brewing technologist   Insight:  Gaps   Decision Making:  Normal   Social Functioning  Social Maturity:  Isolates   Social Judgement:  Normal   Stress  Stressors:  Housing; Surveyor, quantity; Work; Illness   Coping Ability:  Contractor Deficits:  None   Supports:  Family     Religion: Religion/Spirituality Are You A Religious Person?: No How Might This Affect Treatment?: NA  Leisure/Recreation: Leisure / Recreation Do You Have Hobbies?: Yes Leisure and Hobbies: Fishing, reading, watching television  Exercise/Diet: Exercise/Diet Do You Exercise?: No Have You Gained or Lost A Significant Amount of Weight in the Past Six Months?:  No Do You Follow a Special Diet?: No Do You Have Any Trouble Sleeping?: No   CCA Employment/Education Employment/Work Situation: Employment / Work Situation Employment Situation: Unemployed Patient's Job has Been Impacted by Current Illness: No Has Patient ever Been in Equities trader?: No  Education: Education Is Patient Currently Attending School?: No Last Grade Completed: 12 (GED) Did You Attend College?: No Did You Have An Individualized Education Program (IIEP): No Did You Have Any Difficulty At School?: No Patient's Education Has Been Impacted by Current Illness: No   CCA Family/Childhood History Family and Relationship History: Family history Marital status: Divorced Divorced, when?: Years ago What types of issues is patient dealing with in the relationship?: None Additional relationship information: None Does patient have children?: Yes How many children?: 5 How is patient's relationship with their children?: Pt has 4 daughter, 1 son, 3 grandchildren  Childhood History:  Childhood History By whom was/is the patient raised?: Both parents Did patient suffer any verbal/emotional/physical/sexual abuse as a child?: No Did patient suffer from severe childhood neglect?: No Has patient ever been sexually abused/assaulted/raped as an adolescent or adult?: No Was the patient ever a victim of a crime or a disaster?: No Witnessed domestic violence?: No Has patient been affected by domestic violence as an adult?: No       CCA Substance Use Alcohol/Drug Use: Alcohol / Drug Use Pain Medications: Denies abuse Prescriptions: Denies abuse Over the Counter: Denies abuse History of alcohol / drug use?: Yes Longest period of sobriety (when/how long): 3 years Negative Consequences of Use: Financial, Armed forces operational officer, Personal relationships, Work / School Withdrawal Symptoms: Sweats, Tremors, Nausea / Vomiting Substance #1 Name of Substance 1: ETOH 1 - Age of First Use: unknown 1 -  Amount (size/oz): pint of liquor and 2 40z beers 1 - Frequency: daily 1 - Duration: unknown 1 - Last Use / Amount: couple of hours ago 1 - Method of Aquiring: unknown 1- Route of Use: unknown Substance #2 Name of Substance 2: Crack 2 - Age of First Use: 20s 2 - Amount (size/oz): Approximately 0.5 grams 2 - Frequency: 3-4 times per week 2 - Duration: Ongoing for years 2 - Last Use / Amount: Today: 0.5 grams 2 - Method of Aquiring: UTA 2 - Route of Substance Use: UTA Substance #3 Name of Substance 3: Marijuana 3 - Age of First Use: Adolescent 3 - Amount (size/oz): 1 blunt 3 - Frequency: 1-2 times per week 3 - Duration: UTA 3 - Last Use / Amount: 06/18/2022 3 - Method of Aquiring: UTA 3 - Route of Substance Use: UTA  ASAM's:  Six Dimensions of Multidimensional Assessment  Dimension 1:  Acute Intoxication and/or Withdrawal Potential:   Dimension 1:  Description of individual's past and current experiences of substance use and withdrawal: Pt reports daily use of alcohol and experiences withdrawal symptoms when he stops. Also uses cocaine and marijuana.  Dimension 2:  Biomedical Conditions and Complications:   Dimension 2:  Description of patient's biomedical conditions and  complications: Pt has cardiovascular disease w/ h/o chronic systolic heart failure  Dimension 3:  Emotional, Behavioral, or Cognitive Conditions and Complications:  Dimension 3:  Description of emotional, behavioral, or cognitive conditions and complications: Pt reports depressive symptoms including suicidal ideation.  Dimension 4:  Readiness to Change:  Dimension 4:  Description of Readiness to Change criteria: Pt says he is motivated for treatment  Dimension 5:  Relapse, Continued use, or Continued Problem Potential:  Dimension 5:  Relapse, continued use, or continued problem potential critiera description: Pt's longest period of sobriety is 3 years  Dimension 6:  Recovery/Living  Environment:  Dimension 6:  Recovery/Iiving environment criteria description: Pt is homeless  ASAM Severity Score: ASAM's Severity Rating Score: 13  ASAM Recommended Level of Treatment: ASAM Recommended Level of Treatment: Level III Residential Treatment   Substance use Disorder (SUD) Substance Use Disorder (SUD)  Checklist Symptoms of Substance Use: Continued use despite having a persistent/recurrent physical/psychological problem caused/exacerbated by use, Continued use despite persistent or recurrent social, interpersonal problems, caused or exacerbated by use, Evidence of tolerance, Large amounts of time spent to obtain, use or recover from the substance(s), Persistent desire or unsuccessful efforts to cut down or control use, Presence of craving or strong urge to use, Recurrent use that results in a failure to fulfill major role obligations (work, school, home), Repeated use in physically hazardous situations, Social, occupational, recreational activities given up or reduced due to use, Substance(s) often taken in larger amounts or over longer times than was intended  Recommendations for Services/Supports/Treatments: Recommendations for Services/Supports/Treatments Recommendations For Services/Supports/Treatments: Other (Comment) (Observation)  Disposition Recommendation per psychiatric provider: Pt is recommended for Continuous observation and pt is to be seen by psychiatry in AM.   DSM5 Diagnoses: Patient Active Problem List   Diagnosis Date Noted   Cocaine abuse with cocaine-induced mood disorder (HCC) 06/27/2023   Alcohol use disorder, severe, in controlled environment (HCC) 06/27/2023   MDD (major depressive disorder), recurrent episode, severe (HCC) 06/27/2023   Substance abuse (HCC) 06/23/2023   Acute respiratory failure with hypoxia (HCC) 05/06/2021   Acute pulmonary edema (HCC) 05/06/2021   Hypertensive emergency      Referrals to Alternative Service(s): Referred to  Alternative Service(s):   Place:   Date:   Time:    Referred to Alternative Service(s):   Place:   Date:   Time:    Referred to Alternative Service(s):   Place:   Date:   Time:    Referred to Alternative Service(s):   Place:   Date:   Time:     Dava Najjar, [place], [facility], NCC

## 2023-08-23 DIAGNOSIS — E785 Hyperlipidemia, unspecified: Secondary | ICD-10-CM | POA: Diagnosis not present

## 2023-08-23 DIAGNOSIS — F1490 Cocaine use, unspecified, uncomplicated: Secondary | ICD-10-CM | POA: Diagnosis not present

## 2023-08-23 DIAGNOSIS — F1010 Alcohol abuse, uncomplicated: Secondary | ICD-10-CM | POA: Diagnosis not present

## 2023-08-23 DIAGNOSIS — F1590 Other stimulant use, unspecified, uncomplicated: Secondary | ICD-10-CM | POA: Diagnosis not present

## 2023-08-23 MED ORDER — THIAMINE MONONITRATE 100 MG PO TABS
100.0000 mg | ORAL_TABLET | Freq: Every day | ORAL | Status: DC
Start: 2023-08-23 — End: 2023-08-29
  Administered 2023-08-23 – 2023-08-28 (×6): 100 mg via ORAL
  Filled 2023-08-23 (×6): qty 1

## 2023-08-23 MED ORDER — LOSARTAN POTASSIUM 50 MG PO TABS
50.0000 mg | ORAL_TABLET | Freq: Every day | ORAL | Status: DC
  Administered 2023-08-23 – 2023-08-24 (×2): 50 mg via ORAL
  Filled 2023-08-23 (×2): qty 1

## 2023-08-23 MED ORDER — ONDANSETRON 4 MG PO TBDP: 4.0000 mg | ORAL_TABLET | Freq: Four times a day (QID) | ORAL | Status: AC | PRN

## 2023-08-23 MED ORDER — THIAMINE MONONITRATE 100 MG PO TABS: 100.0000 mg | ORAL_TABLET | Freq: Every day | ORAL | Status: DC

## 2023-08-23 MED ORDER — THIAMINE HCL 100 MG/ML IJ SOLN: 100.0000 mg | Freq: Once | INTRAMUSCULAR | Status: DC

## 2023-08-23 MED ORDER — ADULT MULTIVITAMIN W/MINERALS CH
1.0000 | ORAL_TABLET | Freq: Every day | ORAL | Status: DC
  Administered 2023-08-23 – 2023-08-28 (×6): 1 via ORAL
  Filled 2023-08-23 (×6): qty 1

## 2023-08-23 MED ORDER — CLONIDINE HCL 0.1 MG PO TABS
0.1000 mg | ORAL_TABLET | Freq: Once | ORAL | Status: AC
  Administered 2023-08-23: 0.1 mg via ORAL
  Filled 2023-08-23: qty 1

## 2023-08-23 MED ORDER — NALTREXONE HCL 50 MG PO TABS
25.0000 mg | ORAL_TABLET | Freq: Every day | ORAL | Status: DC
  Administered 2023-08-23: 25 mg via ORAL
  Filled 2023-08-23: qty 1

## 2023-08-23 MED ORDER — LOPERAMIDE HCL 2 MG PO CAPS: 2.0000 mg | ORAL_CAPSULE | ORAL | Status: AC | PRN

## 2023-08-23 NOTE — ED Notes (Signed)
 Pt sitting in cafeteria eating dinner and interacting with peers. No acute distress noted. No concerns voiced. Informed pt to notify staff with any needs or assistance. Pt verbalized understanding and agreement. Will continue to monitor for safety.

## 2023-08-23 NOTE — ED Notes (Addendum)
 Pt BP is 158/86. NP Shalon notified.

## 2023-08-23 NOTE — ED Notes (Signed)
 Pt is in the dayroom watching TV with peers. Pt denies SI/HI/AVH. Pt requested for snacks,which was provided by Clinical research associate. Pt has no further complain.No acute distress noted. Will continue to monitor for safety and provide support.

## 2023-08-23 NOTE — ED Notes (Signed)
 Pt sitting in dayroom watching television and interacting with peers. No acute distress noted. No concerns voiced. Informed pt to notify staff with any needs or assistance. Pt verbalized understanding and agreement. Will continue to monitor for safety.

## 2023-08-23 NOTE — ED Notes (Addendum)
 Provider (Dr. Oda Cogan) notified of elevated BP 153/84 via secure chat. Pt denies discomfort. Awaiting response.

## 2023-08-23 NOTE — ED Notes (Signed)
 Patient A&Ox4. Denies intent to harm self/others when asked. Denies A/VH. Patient denies any physical complaints when asked. No acute distress noted. Pleasant and cooperative with staff. Support and encouragement provided. Routine safety checks conducted according to facility protocol. Encouraged patient to notify staff if thoughts of harm toward self or others arise. Patient verbalize understanding and agreement. Will continue to monitor for safety.

## 2023-08-23 NOTE — ED Notes (Signed)
 Pt asleep in the bedroom. NAD. Respirations even and unlabored. Will continue to monitor for safety.

## 2023-08-23 NOTE — Treatment Plan (Signed)
 LCSW, MD, and Resident met with patient to assess current mood, affect, physical state, and inquire about needs/goals while here in [facility] and after discharge. Patient reports he/she presented due to [facility] on 4/10. Patient reports he/she has been living with his daughter following completion of residential treatment at [facility] on 3/19. Patient denies having access to transportation, and reports having limited social support. Patient reports his/her current goal is to seek residential placement/sober living/outpatient resources for substance use. Patient denies any prior history of outpatient or inpatient substance abuse treatment. Patient currently denies any SI/HI/AVH and reports mood as slightly anxious. Patient aware that LCSW will send referrals out for review and will follow up to provide updates as received. Patient is requesting to return to [facility] for 28 day treatment and would like to follow up with a halfway house upon this completion. He stated his sobriety was not a success with returning back to stay with his daughter and feels he needs more structure. Patient expressed understanding and appreciation of LCSW assistance. No other needs were reported at this time by patient.

## 2023-08-23 NOTE — Group Note (Signed)
 Group Topic: Social Support  Group Date: 08/23/2023 Start Time: 1010 End Time: 1037 Facilitators: Loyce Dys, NT; Melvern Banker  Department: [facility]  Number of Participants: 5  Group Focus: safety plan Treatment Modality:  Skills Training Interventions utilized were support Purpose: relapse prevention strategies  Name: Jachin Coury Date of Birth: [birth year]  MR: [id number]    Level of Participation: active Quality of Participation: attentive Interactions with others: gave feedback Mood/Affect: appropriate Triggers (if applicable): n/a Cognition: coherent/clear Progress: Gaining insight Response: PT stated that his plan is to go to another program/facility Plan: patient will be encouraged to attend groups  Patients Problems:  Patient Active Problem List   Diagnosis Date Noted   Substance use disorder 08/22/2023   Cocaine abuse with cocaine-induced mood disorder (HCC) 06/27/2023   Alcohol use disorder, severe, in controlled environment (HCC) 06/27/2023   MDD (major depressive disorder), recurrent episode, severe (HCC) 06/27/2023   Substance abuse (HCC) 06/23/2023   Acute respiratory failure with hypoxia (HCC) 05/06/2021   Acute pulmonary edema (HCC) 05/06/2021   Hypertensive emergency

## 2023-08-23 NOTE — ED Notes (Signed)
 Provider made aware of flagged BP 142/80 but Improving. No new orders provided. Will continue to monitor for safety.

## 2023-08-23 NOTE — BHH Group Notes (Addendum)
 SPIRITUALITY GROUP NOTE  Spirituality group facilitated by Wilkie Aye, MDiv, BCC.  Group Description: Group focused on topic of hope. Patients participated in facilitated discussion around topic, connecting with one another around experiences and definitions for hope. Group members engaged with visual explorer photos, reflecting on what hope looks like for them today. Group engaged in discussion around how their definitions of hope are present today in hospital.  Modalities: Psycho-social ed, Adlerian, Narrative, MI  Patient Progress: Present throughout group.  Oriented to group topic and engaged appropriately.

## 2023-08-23 NOTE — ED Notes (Signed)
 Orders received from provider to continue monitoring. No further interventions noted at present. Pt sitting in dayroom. No acute distress noted. Will continue to monitor for safety.

## 2023-08-23 NOTE — ED Notes (Signed)
 Patient is sleeping. Respirations equal and unlabored, skin warm and dry. No change in assessment or acuity. Routine safety checks conducted according to facility protocol. Will continue to monitor for safety.

## 2023-08-23 NOTE — ED Provider Notes (Addendum)
 Facility Based Crisis Admission H&P  Date: 08/23/23 Patient Name: Joseph Reese MRN: [id number]  Diagnoses:  Final diagnoses:  Alcohol use disorder  Stimulant use disorder    HPI: Joseph Reese is a 58 year old male with a past psychiatric history of alcohol use disorder and stimulant use disorder cocaine type who presents to the [facility] from [facility] for detox and substance use treatment.  Patient was discharged from [facility] after completing the program. He reports drinking alcohol for 35 years. He drinks about a fifth of liquor daily and he most recently drank on 4/9. He reports current withdrawal symptoms of tremor but otherwise, he denies. He denies previous history of withdrawal seizures, AVH, and Dts. He reports longest period of sobriety if 2.5 year from 1995-1998. He states his motivating factors during that time was his father passing away and them discussing about him staying off alcohol. He reports cocaine use start about 30 years ago. He uses cocaine via snorting, about 3 times weekly and spends $80 at a time when he sues. He most recently used cocaine on 4/9. He denies IVDU. He reports smoking a few cigarettes daily, he denies wanting a nicotine patch here. After he detoxes, his preference is to go to a residential facility for substance use. Patient reports having alcohol craving which he notes having frequently. I discussed starting naltrexone to aid with the cravings and he is agreeable.  He denies previous psychiatric diagnosis, previous and current psychotropic medications, and denies seeing a psychiatrist or therapist in the past. He reports good sleep PTA, reporting sleeping an average of 6-7 hours nightly. She reports good appetite and states his mood is "alright". He denies current SI and HI.  He reports having HTN in which he takes medication for but he is unable to recall the name. He denies family history of psychiatric diagnosis. He was living with his daughter PTA. He is a part time  roofer. His support system includes his mother, daughter, brother, and sister. He denies legal issues.  PHQ 2-9:  Flowsheet Row ED from 06/23/2023 in [facility] Most recent reading at 06/27/2023  8:29 AM ED from 06/23/2023 in [facility] Most recent reading at 06/23/2023  4:18 AM Office Visit from 05/11/2021 in [facility] Most recent reading at 05/11/2021  9:36 AM  Thoughts that you would be better off dead, or of hurting yourself in some way Not at all Several days Not at all  PHQ-9 Total Score 0 19 2       Flowsheet Row ED from 08/22/2023 in [facility] Most recent reading at 08/22/2023  4:05 PM ED from 08/22/2023 in [facility] Most recent reading at 08/22/2023  4:00 AM ED from 08/16/2023 in [facility] Most recent reading at 08/16/2023  9:51 PM  C-SSRS RISK CATEGORY No Risk No Risk No Risk       Screenings    Flowsheet Row Most Recent Value  CIWA-Ar Total 2       Total Time spent with patient: 30 minutes  Musculoskeletal  Strength & Muscle Tone: within normal limits Gait & Station: normal Patient leans: N/A  Psychiatric Specialty Exam  Presentation General Appearance:  Appropriate for Environment; Fairly Groomed  Eye Contact: Fair  Speech: Clear and Coherent; Normal Rate  Speech Volume: Normal  Handedness: Right   Mood and  Affect  Mood: Euthymic  Affect: Congruent; Appropriate   Thought Process  Thought Processes: Coherent  Descriptions of Associations:Intact  Orientation:Full (Time, Place and Person)  Thought Content:Logical  Diagnosis of Schizophrenia or Schizoaffective disorder in past: No   Hallucinations:Hallucinations: None  Ideas of Reference:None  Suicidal Thoughts:Suicidal Thoughts: No  Homicidal Thoughts:Homicidal  Thoughts: No   Sensorium  Memory: Remote Good  Judgment: Fair  Insight: Fair   Art therapist  Concentration: Good  Attention Span: Good  Recall: Good  Fund of Knowledge: Good  Language: Good   Psychomotor Activity  Psychomotor Activity: Psychomotor Activity: Normal AIMS Completed?: No   Assets  Assets: Communication Skills; Resilience; Social Support; Desire for Improvement   Sleep  Sleep: Sleep: Good Number of Hours of Sleep: 8   Nutritional Assessment (For OBS and FBC admissions only) Has the patient had a weight loss or gain of 10 pounds or more in the last 3 months?: No Has the patient had a decrease in food intake/or appetite?: No Does the patient have dental problems?: No Does the patient have eating habits or behaviors that may be indicators of an eating disorder including binging or inducing vomiting?: No Has the patient recently lost weight without trying?: 0 Has the patient been eating poorly because of a decreased appetite?: 0 Malnutrition Screening Tool Score: 0    Physical Exam Vitals reviewed.  Constitutional:      Appearance: Normal appearance.  HENT:     Head: Normocephalic and atraumatic.  Cardiovascular:     Rate and Rhythm: Normal rate.  Pulmonary:     Effort: Pulmonary effort is normal.  Neurological:     General: No focal deficit present.     Mental Status: He is alert and oriented to person, place, and time.    Review of Systems  Constitutional:  Negative for chills and fever.  Respiratory:  Negative for shortness of breath.   Cardiovascular:  Negative for chest pain and palpitations.  Gastrointestinal:  Negative for nausea and vomiting.  Neurological:  Positive for tremors. Negative for headaches.    Blood pressure (!) 150/85, pulse 80, temperature 98.2 F (36.8 C), temperature source Oral, resp. rate 18, SpO2 100%. There is no height or weight on file to calculate BMI.  Past Psychiatric History:  Dx:  AUD, stimulant use d/o Current psychotropic medications: Denies Previous psychotropic medications: Denies Psychiatrist: Denies Therapist: Denies  Past Medical History:  Dx: HTN Medications: per chart review, losartan  Family Psychiatric History: Denies  Social History:  Living: with daughter Job: Roofing Support: mother, daughter, brother, sister Legal History: denies  Substance History:  Smoking: few cigarettes daily Alcohol:   Onset: 35 years ago  Amount and frequency: 1.5 liquor daily  Last use: 4/9  Hx of withdrawal symptoms: states only tremors, denies Dts and seizures  Periods of sobriety: 1995-1998 Illicit drugs: yes, crack cocaine daily since he was in his 20's; uses three times weekly, $80 worth at a time, most recently used 4/9 Rehab: yes multiple times  Is the patient at risk to self? No  Has the patient been a risk to self in the past 6 months? No .    Has the patient been a risk to self within the distant past? No   Is the patient a risk to others? No   Has the patient been a risk to others in the past 6 months? No   Has the patient been a risk to others within the distant past? No   Last  Labs:  Admission on 08/22/2023, Discharged on 08/22/2023  Component Date Value Ref Range Status   WBC 08/22/2023 6.1  4.0 - 10.5 K/uL Final   RBC 08/22/2023 5.43  4.22 - 5.81 MIL/uL Final   Hemoglobin 08/22/2023 15.3  13.0 - 17.0 g/dL Final   HCT 13/12/6576 45.3  39.0 - 52.0 % Final   MCV 08/22/2023 83.4  80.0 - 100.0 fL Final   MCH 08/22/2023 28.2  26.0 - 34.0 pg Final   MCHC 08/22/2023 33.8  30.0 - 36.0 g/dL Final   RDW 46/96/2952 14.1  11.5 - 15.5 % Final   Platelets 08/22/2023 293  150 - 400 K/uL Final   nRBC 08/22/2023 0.0  0.0 - 0.2 % Final   Neutrophils Relative % 08/22/2023 52  % Final   Neutro Abs 08/22/2023 3.1  1.7 - 7.7 K/uL Final   Lymphocytes Relative 08/22/2023 37  % Final   Lymphs Abs 08/22/2023 2.3  0.7 - 4.0 K/uL Final   Monocytes Relative  08/22/2023 8  % Final   Monocytes Absolute 08/22/2023 0.5  0.1 - 1.0 K/uL Final   Eosinophils Relative 08/22/2023 3  % Final   Eosinophils Absolute 08/22/2023 0.2  0.0 - 0.5 K/uL Final   Basophils Relative 08/22/2023 0  % Final   Basophils Absolute 08/22/2023 0.0  0.0 - 0.1 K/uL Final   Immature Granulocytes 08/22/2023 0  % Final   Abs Immature Granulocytes 08/22/2023 0.02  0.00 - 0.07 K/uL Final   Performed at [facility], [address] [place], [place] [address]   Sodium 08/22/2023 142  135 - 145 mmol/L Final   Potassium 08/22/2023 3.9  3.5 - 5.1 mmol/L Final   Chloride 08/22/2023 105  98 - 111 mmol/L Final   CO2 08/22/2023 24  22 - 32 mmol/L Final   Glucose, Bld 08/22/2023 82  70 - 99 mg/dL Final   Glucose reference range applies only to samples taken after fasting for at least 8 hours.   BUN 08/22/2023 8  6 - 20 mg/dL Final   Creatinine, Ser 08/22/2023 0.77  0.61 - 1.24 mg/dL Final   Calcium 44/05/270 9.6  8.9 - 10.3 mg/dL Final   Total Protein 53/66/4403 7.6  6.5 - 8.1 g/dL Final   Albumin 47/42/5956 4.1  3.5 - 5.0 g/dL Final   AST 38/75/6433 25  15 - 41 U/L Final   ALT 08/22/2023 23  0 - 44 U/L Final   Alkaline Phosphatase 08/22/2023 59  38 - 126 U/L Final   Total Bilirubin 08/22/2023 0.6  0.0 - 1.2 mg/dL Final   GFR, Estimated 08/22/2023 >60  >60 mL/min Final   Comment: (NOTE) Calculated using the CKD-EPI Creatinine Equation (2021)    Anion gap 08/22/2023 13  5 - 15 Final   Performed at [facility], [address], [place], [place] [address]   Alcohol, Ethyl (B) 08/22/2023 172 (H)  <10 mg/dL Final   Comment: (NOTE) Lowest detectable limit for serum alcohol is 10 mg/dL.  For medical purposes only. Performed at [facility], [address], [place], [place] [address]    TSH 08/22/2023 0.901  0.350 - 4.500 uIU/mL Final   Comment: Performed by a 3rd Generation assay with a functional sensitivity of <=0.01 uIU/mL. Performed at [facility],  [address] [place], [place] [address]    POC Amphetamine UR 08/22/2023 None Detected  NONE DETECTED (Cut Off Level 1000 ng/mL) Final   POC Secobarbital (  BAR) 08/22/2023 None Detected  NONE DETECTED (Cut Off Level 300 ng/mL) Final   POC Buprenorphine (BUP) 08/22/2023 None Detected  NONE DETECTED (Cut Off Level 10 ng/mL) Final   POC Oxazepam (BZO) 08/22/2023 None Detected  NONE DETECTED (Cut Off Level 300 ng/mL) Final   POC Cocaine UR 08/22/2023 None Detected  NONE DETECTED (Cut Off Level 300 ng/mL) Final   POC Methamphetamine UR 08/22/2023 None Detected  NONE DETECTED (Cut Off Level 1000 ng/mL) Final   POC Morphine 08/22/2023 None Detected  NONE DETECTED (Cut Off Level 300 ng/mL) Final   POC Methadone UR 08/22/2023 None Detected  NONE DETECTED (Cut Off Level 300 ng/mL) Final   POC Oxycodone UR 08/22/2023 None Detected  NONE DETECTED (Cut Off Level 100 ng/mL) Final   POC Marijuana UR 08/22/2023 None Detected  NONE DETECTED (Cut Off Level 50 ng/mL) Final  Admission on 08/16/2023, Discharged on 08/17/2023  Component Date Value Ref Range Status   Sodium 08/16/2023 138  135 - 145 mmol/L Final   Potassium 08/16/2023 3.6  3.5 - 5.1 mmol/L Final   Chloride 08/16/2023 108  98 - 111 mmol/L Final   CO2 08/16/2023 22  22 - 32 mmol/L Final   Glucose, Bld 08/16/2023 82  70 - 99 mg/dL Final   Glucose reference range applies only to samples taken after fasting for at least 8 hours.   BUN 08/16/2023 9  6 - 20 mg/dL Final   Creatinine, Ser 08/16/2023 0.86  0.61 - 1.24 mg/dL Final   Calcium 27/25/3664 9.3  8.9 - 10.3 mg/dL Final   GFR, Estimated 08/16/2023 >60  >60 mL/min Final   Comment: (NOTE) Calculated using the CKD-EPI Creatinine Equation (2021)    Anion gap 08/16/2023 8  5 - 15 Final   Performed at [facility], [address], [place], [place] [address]   WBC 08/16/2023 5.7  4.0 - 10.5 K/uL Final   RBC 08/16/2023 4.68  4.22 - 5.81 MIL/uL Final   Hemoglobin 08/16/2023 13.1  13.0 - 17.0 g/dL  Final   HCT 42/59/5638 38.4 (L)  39.0 - 52.0 % Final   MCV 08/16/2023 82.1  80.0 - 100.0 fL Final   MCH 08/16/2023 28.0  26.0 - 34.0 pg Final   MCHC 08/16/2023 34.1  30.0 - 36.0 g/dL Final   RDW 75/64/3329 14.3  11.5 - 15.5 % Final   Platelets 08/16/2023 276  150 - 400 K/uL Final   nRBC 08/16/2023 0.0  0.0 - 0.2 % Final   Performed at [facility], [address], [place], [place] [address]   Troponin I (High Sensitivity) 08/16/2023 21 (H)  <18 ng/L Final   Comment: (NOTE) Elevated high sensitivity troponin I (hsTnI) values and significant  changes across serial measurements may suggest ACS but many other  chronic and acute conditions are known to elevate hsTnI results.  Refer to the "Links" section for chest pain algorithms and additional  guidance. Performed at [facility], [address], [place], [place] [address]    Troponin I (High Sensitivity) 08/16/2023 20 (H)  <18 ng/L Final   Comment: (NOTE) Elevated high sensitivity troponin I (hsTnI) values and significant  changes across serial measurements may suggest ACS but many other  chronic and acute conditions are known to elevate hsTnI results.  Refer to the "Links" section for chest pain algorithms and additional  guidance. Performed at [facility], [address] [place], [place] [address]   Admission on 06/27/2023,  Discharged on 06/29/2023  Component Date Value Ref Range Status   POC Amphetamine UR 06/27/2023 None Detected  NONE DETECTED (Cut Off Level 1000 ng/mL) Final   POC Secobarbital (BAR) 06/27/2023 None Detected  NONE DETECTED (Cut Off Level 300 ng/mL) Final   POC Buprenorphine (BUP) 06/27/2023 None Detected  NONE DETECTED (Cut Off Level 10 ng/mL) Final   POC Oxazepam (BZO) 06/27/2023 None Detected  NONE DETECTED (Cut Off Level 300 ng/mL) Final   POC Cocaine UR 06/27/2023 None Detected  NONE DETECTED (Cut Off Level 300 ng/mL) Final   POC Methamphetamine UR 06/27/2023 None Detected  NONE DETECTED  (Cut Off Level 1000 ng/mL) Final   POC Morphine 06/27/2023 None Detected  NONE DETECTED (Cut Off Level 300 ng/mL) Final   POC Methadone UR 06/27/2023 None Detected  NONE DETECTED (Cut Off Level 300 ng/mL) Final   POC Oxycodone UR 06/27/2023 None Detected  NONE DETECTED (Cut Off Level 100 ng/mL) Final   POC Marijuana UR 06/27/2023 Positive (A)  NONE DETECTED (Cut Off Level 50 ng/mL) Final  Admission on 06/23/2023, Discharged on 06/27/2023  Component Date Value Ref Range Status   WBC 06/23/2023 4.4  4.0 - 10.5 K/uL Final   RBC 06/23/2023 4.93  4.22 - 5.81 MIL/uL Final   Hemoglobin 06/23/2023 14.2  13.0 - 17.0 g/dL Final   HCT 14/78/2956 41.6  39.0 - 52.0 % Final   MCV 06/23/2023 84.4  80.0 - 100.0 fL Final   MCH 06/23/2023 28.8  26.0 - 34.0 pg Final   MCHC 06/23/2023 34.1  30.0 - 36.0 g/dL Final   RDW 21/30/8657 14.2  11.5 - 15.5 % Final   Platelets 06/23/2023 224  150 - 400 K/uL Final   nRBC 06/23/2023 0.0  0.0 - 0.2 % Final   Neutrophils Relative % 06/23/2023 38  % Final   Neutro Abs 06/23/2023 1.7  1.7 - 7.7 K/uL Final   Lymphocytes Relative 06/23/2023 43  % Final   Lymphs Abs 06/23/2023 1.9  0.7 - 4.0 K/uL Final   Monocytes Relative 06/23/2023 12  % Final   Monocytes Absolute 06/23/2023 0.5  0.1 - 1.0 K/uL Final   Eosinophils Relative 06/23/2023 6  % Final   Eosinophils Absolute 06/23/2023 0.3  0.0 - 0.5 K/uL Final   Basophils Relative 06/23/2023 0  % Final   Basophils Absolute 06/23/2023 0.0  0.0 - 0.1 K/uL Final   Immature Granulocytes 06/23/2023 1  % Final   Abs Immature Granulocytes 06/23/2023 0.02  0.00 - 0.07 K/uL Final   Performed at [facility], [address], [place], [place] [address]   Sodium 06/23/2023 139  135 - 145 mmol/L Final   Potassium 06/23/2023 3.7  3.5 - 5.1 mmol/L Final   Chloride 06/23/2023 107  98 - 111 mmol/L Final   CO2 06/23/2023 24  22 - 32 mmol/L Final   Glucose, Bld 06/23/2023 112 (H)  70 - 99 mg/dL Final   Glucose reference range applies  only to samples taken after fasting for at least 8 hours.   BUN 06/23/2023 9  6 - 20 mg/dL Final   Creatinine, Ser 06/23/2023 0.88  0.61 - 1.24 mg/dL Final   Calcium 29/52/8413 9.3  8.9 - 10.3 mg/dL Final   Total Protein 24/40/1027 6.8  6.5 - 8.1 g/dL Final   Albumin 25/36/6440 3.6  3.5 - 5.0 g/dL Final   AST 34/74/2595 22  15 - 41 U/L Final   ALT 06/23/2023 16  0 - 44 U/L  Final   Alkaline Phosphatase 06/23/2023 54  38 - 126 U/L Final   Total Bilirubin 06/23/2023 0.6  0.0 - 1.2 mg/dL Final   GFR, Estimated 06/23/2023 >60  >60 mL/min Final   Comment: (NOTE) Calculated using the CKD-EPI Creatinine Equation (2021)    Anion gap 06/23/2023 8  5 - 15 Final   Performed at [facility], [address], [place], [place] [address]   Hgb A1c MFr Bld 06/23/2023 5.1  4.8 - 5.6 % Final   Comment: (NOTE) Pre diabetes:          5.7%-6.4%  Diabetes:              >6.4%  Glycemic control for   <7.0% adults with diabetes    Mean Plasma Glucose 06/23/2023 99.67  mg/dL Final   Performed at [facility], [address], [place], [place] [address]   Alcohol, Ethyl (B) 06/23/2023 <10  <10 mg/dL Final   Comment: (NOTE) Lowest detectable limit for serum alcohol is 10 mg/dL.  For medical purposes only. Performed at [facility], [address] [place], [place] [address]    POC Amphetamine UR 06/23/2023 None Detected  NONE DETECTED (Cut Off Level 1000 ng/mL) Final   POC Secobarbital (BAR) 06/23/2023 None Detected  NONE DETECTED (Cut Off Level 300 ng/mL) Final   POC Buprenorphine (BUP) 06/23/2023 None Detected  NONE DETECTED (Cut Off Level 10 ng/mL) Final   POC Oxazepam (BZO) 06/23/2023 None Detected  NONE DETECTED (Cut Off Level 300 ng/mL) Final   POC Cocaine UR 06/23/2023 Positive (A)  NONE DETECTED (Cut Off Level 300 ng/mL) Final   POC Methamphetamine UR 06/23/2023 None Detected  NONE DETECTED (Cut Off Level 1000 ng/mL) Final   POC Morphine 06/23/2023 None Detected  NONE DETECTED  (Cut Off Level 300 ng/mL) Final   POC Methadone UR 06/23/2023 None Detected  NONE DETECTED (Cut Off Level 300 ng/mL) Final   POC Oxycodone UR 06/23/2023 None Detected  NONE DETECTED (Cut Off Level 100 ng/mL) Final   POC Marijuana UR 06/23/2023 Positive (A)  NONE DETECTED (Cut Off Level 50 ng/mL) Final   Cholesterol 06/23/2023 192  0 - 200 mg/dL Final   Triglycerides 91/47/8295 46  <150 mg/dL Final   HDL 62/13/0865 76  >40 mg/dL Final   Total CHOL/HDL Ratio 06/23/2023 2.5  RATIO Final   VLDL 06/23/2023 9  0 - 40 mg/dL Final   LDL Cholesterol 06/23/2023 107 (H)  0 - 99 mg/dL Final   Comment:        Total Cholesterol/HDL:CHD Risk Coronary Heart Disease Risk Table                     Men   Women  1/2 Average Risk   3.4   3.3  Average Risk       5.0   4.4  2 X Average Risk   9.6   7.1  3 X Average Risk  23.4   11.0        Use the calculated Patient Ratio above and the CHD Risk Table to determine the patient's CHD Risk.        ATP III CLASSIFICATION (LDL):  <100     mg/dL   Optimal  784-696  mg/dL   Near or Above                    Optimal  130-159  mg/dL   Borderline  295-284  mg/dL   High  >161     mg/dL   Very High Performed at [facility], [address], [place], [place] [address]    TSH 06/23/2023 1.229  0.350 - 4.500 uIU/mL Final   Comment: Performed by a 3rd Generation assay with a functional sensitivity of <=0.01 uIU/mL. Performed at [facility], [address] [place], [place] [address]   Admission on 06/20/2023, Discharged on 06/20/2023  Component Date Value Ref Range Status   Sodium 06/20/2023 134 (L)  135 - 145 mmol/L Final   Potassium 06/20/2023 3.9  3.5 - 5.1 mmol/L Final   Chloride 06/20/2023 100  98 - 111 mmol/L Final   CO2 06/20/2023 22  22 - 32 mmol/L Final   Glucose, Bld 06/20/2023 77  70 - 99 mg/dL Final   Glucose reference range applies only to samples taken after fasting for at least 8 hours.   BUN 06/20/2023 8  6 - 20 mg/dL Final    Creatinine, Ser 06/20/2023 0.85  0.61 - 1.24 mg/dL Final   Calcium 11/91/4782 9.0  8.9 - 10.3 mg/dL Final   GFR, Estimated 06/20/2023 >60  >60 mL/min Final   Comment: (NOTE) Calculated using the CKD-EPI Creatinine Equation (2021)    Anion gap 06/20/2023 12  5 - 15 Final   Performed at [facility], [address], [place], [place] [address]   WBC 06/20/2023 5.0  4.0 - 10.5 K/uL Final   RBC 06/20/2023 5.19  4.22 - 5.81 MIL/uL Final   Hemoglobin 06/20/2023 15.2  13.0 - 17.0 g/dL Final   HCT 30/86/5784 43.9  39.0 - 52.0 % Final   MCV 06/20/2023 84.6  80.0 - 100.0 fL Final   MCH 06/20/2023 29.3  26.0 - 34.0 pg Final   MCHC 06/20/2023 34.6  30.0 - 36.0 g/dL Final   RDW 69/62/9528 14.1  11.5 - 15.5 % Final   Platelets 06/20/2023 239  150 - 400 K/uL Final   nRBC 06/20/2023 0.0  0.0 - 0.2 % Final   Performed at [facility], [address] [place], [place] [address]   Troponin I (High Sensitivity) 06/20/2023 21 (H)  <18 ng/L Final   Comment: (NOTE) Elevated high sensitivity troponin I (hsTnI) values and significant  changes across serial measurements may suggest ACS but many other  chronic and acute conditions are known to elevate hsTnI results.  Refer to the "Links" section for chest pain algorithms and additional  guidance. Performed at [facility], [address] [place], [place] [address]    B Natriuretic Peptide 06/20/2023 54.9  0.0 - 100.0 pg/mL Final   Performed at [facility], [address], [place], [place] [address]   Troponin I (High Sensitivity) 06/20/2023 19 (H)  <18 ng/L Final   Comment: (NOTE) Elevated high sensitivity troponin I (hsTnI) values and significant  changes across serial measurements may suggest ACS but many other  chronic and acute conditions are known to elevate hsTnI results.  Refer to the "Links" section for chest pain algorithms and additional  guidance. Performed at [facility], [address] [place],  [place] [address]   Admission on 06/17/2023, Discharged on 06/18/2023  Component Date Value Ref Range Status   Sodium 06/17/2023 137  135 - 145 mmol/L Final   Potassium 06/17/2023 3.4 (L)  3.5 - 5.1 mmol/L Final   Chloride 06/17/2023 102  98 - 111 mmol/L Final   CO2 06/17/2023 23  22 - 32 mmol/L Final   Glucose, Bld 06/17/2023 84  70 - 99 mg/dL Final   Glucose reference range applies only to samples taken after fasting for at least 8 hours.   BUN 06/17/2023 5 (L)  6 - 20 mg/dL Final   Creatinine, Ser 06/17/2023 1.01  0.61 - 1.24 mg/dL Final   Calcium 16/02/9603 8.9  8.9 - 10.3 mg/dL Final   GFR, Estimated 06/17/2023 >60  >60 mL/min Final   Comment: (NOTE) Calculated using the CKD-EPI Creatinine Equation (2021)    Anion gap 06/17/2023 12  5 - 15 Final   Performed at [facility], [address], [place], [place] [address]   WBC 06/17/2023 5.0  4.0 - 10.5 K/uL Final   RBC 06/17/2023 4.63  4.22 - 5.81 MIL/uL Final   Hemoglobin 06/17/2023 13.4  13.0 - 17.0 g/dL Final   HCT 11/91/4782 39.7  39.0 - 52.0 % Final   MCV 06/17/2023 85.7  80.0 - 100.0 fL Final   MCH 06/17/2023 28.9  26.0 - 34.0 pg Final   MCHC 06/17/2023 33.8  30.0 - 36.0 g/dL Final   RDW 95/62/1308 14.6  11.5 - 15.5 % Final   Platelets 06/17/2023 234  150 - 400 K/uL Final   nRBC 06/17/2023 0.0  0.0 - 0.2 % Final   Performed at [facility], [address], [place], [place] [address]   Troponin I (High Sensitivity) 06/17/2023 36 (H)  <18 ng/L Final   Comment: (NOTE) Elevated high sensitivity troponin I (hsTnI) values and significant  changes across serial measurements may suggest ACS but many other  chronic and acute conditions are known to elevate hsTnI results.  Refer to the "Links" section for chest pain algorithms and additional  guidance. Performed at [facility], [address], [place], [place] [address]    B Natriuretic Peptide 06/17/2023 185.1 (H)  0.0 - 100.0 pg/mL Final   Performed at [facility], [address], [place], [place] [address]   SARS Coronavirus 2 by RT PCR 06/17/2023 NEGATIVE  NEGATIVE Final   Influenza A by PCR 06/17/2023 NEGATIVE  NEGATIVE Final   Influenza B by PCR 06/17/2023 NEGATIVE  NEGATIVE Final   Comment: (NOTE) The Xpert Xpress SARS-CoV-2/FLU/RSV plus assay is intended as an aid in the diagnosis of influenza from Nasopharyngeal swab specimens and should not be used as a sole basis for treatment. Nasal washings and aspirates are unacceptable for Xpert Xpress SARS-CoV-2/FLU/RSV testing.  Fact Sheet for Patients: BloggerCourse.com  Fact Sheet for Healthcare Providers: SeriousBroker.it  This test is not yet approved or cleared by the [place] FDA and has been authorized for detection and/or diagnosis of SARS-CoV-2 by FDA under an Emergency Use Authorization (EUA). This EUA will remain in effect (meaning this test can be used) for the duration of the COVID-19 declaration under Section 564(b)(1) of the Act, 21 U.S.C. section 360bbb-3(b)(1), unless the authorization is terminated or revoked.     Resp Syncytial Virus by PCR 06/17/2023 NEGATIVE  NEGATIVE Final   Comment: (NOTE) Fact Sheet for Patients: BloggerCourse.com  Fact Sheet for Healthcare Providers: SeriousBroker.it  This test is not yet approved or cleared by the [place] FDA and has been authorized for detection and/or diagnosis of SARS-CoV-2 by FDA under an Emergency Use Authorization (EUA). This EUA will remain in effect (meaning this test can be used) for the duration of the COVID-19 declaration under Section 564(b)(1) of the Act, 21 U.S.C. section  360bbb-3(b)(1), unless the authorization is terminated or revoked.  Performed at [facility], [address] [place], [place] [address]    Troponin I (High Sensitivity) 06/17/2023 37 (H)  <18 ng/L Final   Comment:  (NOTE) Elevated high sensitivity troponin I (hsTnI) values and significant  changes across serial measurements may suggest ACS but many other  chronic and acute conditions are known to elevate hsTnI results.  Refer to the "Links" section for chest pain algorithms and additional  guidance. Performed at [facility], [address] [place], [place] [address]     Allergies: Patient has no known allergies.  Medications:  Facility Ordered Medications  Medication   [COMPLETED] thiamine (VITAMIN B1) injection 100 mg   LORazepam (ATIVAN) tablet 1 mg   [COMPLETED] LORazepam (ATIVAN) tablet 1 mg   Followed by   LORazepam (ATIVAN) tablet 1 mg   Followed by   Melene Muller ON 08/24/2023] LORazepam (ATIVAN) tablet 1 mg   Followed by   Melene Muller ON 08/25/2023] LORazepam (ATIVAN) tablet 1 mg   acetaminophen (TYLENOL) tablet 650 mg   alum & mag hydroxide-simeth (MAALOX/MYLANTA) 200-200-20 MG/5ML suspension 30 mL   magnesium hydroxide (MILK OF MAGNESIA) suspension 30 mL   haloperidol (HALDOL) tablet 5 mg   And   diphenhydrAMINE (BENADRYL) capsule 50 mg   haloperidol lactate (HALDOL) injection 5 mg   And   diphenhydrAMINE (BENADRYL) injection 50 mg   And   LORazepam (ATIVAN) injection 2 mg   haloperidol lactate (HALDOL) injection 10 mg   And   diphenhydrAMINE (BENADRYL) injection 50 mg   And   LORazepam (ATIVAN) injection 2 mg   hydrOXYzine (ATARAX) tablet 25 mg   traZODone (DESYREL) tablet 50 mg   [COMPLETED] cloNIDine (CATAPRES) tablet 0.1 mg   multivitamin with minerals tablet 1 tablet   loperamide (IMODIUM) capsule 2-4 mg   ondansetron (ZOFRAN-ODT) disintegrating tablet 4 mg   thiamine (VITAMIN B1) tablet 100 mg   losartan (COZAAR) tablet 50 mg    Long Term Goals: Improvement in symptoms so as ready for discharge  Short Term Goals: Patient will verbalize feelings in meetings with treatment team members., Patient will attend at least of 50% of the groups daily., Patient will  participate in completing the [place] Suicide Severity Rating Scale, Patient will score a low risk of violence for 24 hours prior to discharge, and Patient will take medications as prescribed daily.  Medical Decision Making  Alcohol Use Disorder EtOH 172, LFTs WNL -Ativan taper -CIWA with Ativan as needed for CIWA greater than 10  -Last CIWA score is 2 on 4/11 at 6AM -Start naltrexone 25 mg at bedtime, can increase to 50 mg tomorrow if tolerating -Thiamine 100 mg IM first day and PO after that -Multivitamin with minerals daily -Tylenol 650 mg every 6 hours as needed for pain -Zofran 4 mg every 6 hours as needed for nausea or vomiting -Imodium 2 to 4 mg as needed for diarrhea or loose stools  -Maalox/Mylanta 30 mL every 4 hours as needed for indigestion -Milk of Mag 30 mL as needed for constipation   Stimulant Use Disorder (cocaine type) -Encourage cessation -PRN Tylenol, maalox/mylanta, milk of magnesia   Medical:  HTN- start losartan 50 mg daily for HTN (previous rx)  Recommendations  Based on my evaluation the patient does not appear to have an emergency medical condition.  Lance Muss, MD 08/23/23  10:20 AM

## 2023-08-23 NOTE — Group Note (Signed)
 Group Topic: Decisional Balance/Substance Abuse  Group Date: 08/23/2023 Start Time: 1705 End Time: 1730 Facilitators: Prentice Docker, RN  Department: [facility]  Number of Participants: 6  Group Focus: goals/reality orientation Treatment Modality:  Solution-Focused Therapy Interventions utilized were leisure development Purpose: increase insight  Name: Joseph Reese Date of Birth: [birth year]  MR: [id number]    Level of Participation: active Quality of Participation: attentive and cooperative Interactions with others: gave feedback Mood/Affect: appropriate Triggers (if applicable): none identified Cognition: coherent/clear Progress: Gaining insight Response: Pt states when discharged, he would like to go to a residential program for continued treatment Plan: patient will be encouraged to continue tx and attend AA meetings once discharged  Patients Problems:  Patient Active Problem List   Diagnosis Date Noted   Substance use disorder 08/22/2023   Cocaine abuse with cocaine-induced mood disorder (HCC) 06/27/2023   Alcohol use disorder, severe, in controlled environment (HCC) 06/27/2023   MDD (major depressive disorder), recurrent episode, severe (HCC) 06/27/2023   Substance abuse (HCC) 06/23/2023   Acute respiratory failure with hypoxia (HCC) 05/06/2021   Acute pulmonary edema (HCC) 05/06/2021   Hypertensive emergency

## 2023-08-24 DIAGNOSIS — F1590 Other stimulant use, unspecified, uncomplicated: Secondary | ICD-10-CM | POA: Diagnosis not present

## 2023-08-24 DIAGNOSIS — F1010 Alcohol abuse, uncomplicated: Secondary | ICD-10-CM | POA: Diagnosis not present

## 2023-08-24 DIAGNOSIS — F1490 Cocaine use, unspecified, uncomplicated: Secondary | ICD-10-CM | POA: Diagnosis not present

## 2023-08-24 DIAGNOSIS — E785 Hyperlipidemia, unspecified: Secondary | ICD-10-CM | POA: Diagnosis not present

## 2023-08-24 MED ORDER — LOSARTAN POTASSIUM 50 MG PO TABS
100.0000 mg | ORAL_TABLET | Freq: Every day | ORAL | Status: DC
  Administered 2023-08-25: 100 mg via ORAL
  Filled 2023-08-24: qty 2

## 2023-08-24 MED ORDER — CLONIDINE HCL 0.1 MG PO TABS
0.1000 mg | ORAL_TABLET | Freq: Two times a day (BID) | ORAL | Status: DC | PRN
  Administered 2023-08-25: 0.1 mg via ORAL
  Filled 2023-08-24: qty 1

## 2023-08-24 MED ORDER — NALTREXONE HCL 50 MG PO TABS
50.0000 mg | ORAL_TABLET | Freq: Every day | ORAL | Status: DC
  Administered 2023-08-24 – 2023-08-26 (×3): 50 mg via ORAL
  Filled 2023-08-24 (×3): qty 1

## 2023-08-24 MED ORDER — CLONIDINE HCL 0.1 MG PO TABS
0.1000 mg | ORAL_TABLET | Freq: Three times a day (TID) | ORAL | Status: DC | PRN
  Administered 2023-08-24: 0.1 mg via ORAL
  Filled 2023-08-24: qty 1

## 2023-08-24 NOTE — Group Note (Signed)
 Group Topic: Change and Accountability  Group Date: 08/24/2023 Start Time: 0830 End Time: 0900 Facilitators: Alvino Joseph, NT  Department: [facility]  Number of Participants: 9  Group Focus: coping skills Treatment Modality:  Patient-Centered Therapy Interventions utilized were clarification Purpose: enhance coping skills  Name: Joseph Reese Date of Birth: 12-31-1965  MR: [id number]    Level of Participation: moderate Quality of Participation: cooperative Interactions with others: n/a Mood/Affect: appropriate Triggers (if applicable): n/a Cognition: coherent/clear Progress: Moderate Response: n/a Plan: follow-up needed  Patients Problems:  Patient Active Problem List   Diagnosis Date Noted   Substance use disorder 08/22/2023   Cocaine abuse with cocaine-induced mood disorder (HCC) 06/27/2023   Alcohol use disorder, severe, in controlled environment (HCC) 06/27/2023   MDD (major depressive disorder), recurrent episode, severe (HCC) 06/27/2023   Substance abuse (HCC) 06/23/2023   Acute respiratory failure with hypoxia (HCC) 05/06/2021   Acute pulmonary edema (HCC) 05/06/2021   Hypertensive emergency

## 2023-08-24 NOTE — ED Notes (Signed)
 Sitting in dayroom watching tv with peers.  No withdrawal.  Calm and pleasant.  Will monitor

## 2023-08-24 NOTE — Group Note (Signed)
 Group Topic: Balance in Life  Group Date: 08/24/2023 Start Time: 1300 End Time: 1320 Facilitators: Izola Mart, NT  Department: [facility]  Number of Participants: 5  Group Focus: acceptance Treatment Modality:  Patient-Centered Therapy Interventions utilized were group exercise Purpose: express feelings  Name: Joseph Reese Date of Birth: [birth year]  MR: [id number]    Level of Participation: minimal Quality of Participation: attentive Interactions with others: intrusive Mood/Affect: labile Triggers (if applicable):   Cognition: insightful Progress: Significant Response:   Plan: patient will be encouraged to    Patients Problems:  Patient Active Problem List   Diagnosis Date Noted   Substance use disorder 08/22/2023   Cocaine abuse with cocaine-induced mood disorder (HCC) 06/27/2023   Alcohol use disorder, severe, in controlled environment (HCC) 06/27/2023   MDD (major depressive disorder), recurrent episode, severe (HCC) 06/27/2023   Substance abuse (HCC) 06/23/2023   Acute respiratory failure with hypoxia (HCC) 05/06/2021   Acute pulmonary edema (HCC) 05/06/2021   Hypertensive emergency

## 2023-08-24 NOTE — ED Notes (Signed)
 Patient remains asleep in bed without issue or complaint.  No distress or withdrawal at this time. Tp;erating ativan taper.  Will monitor.

## 2023-08-24 NOTE — Group Note (Signed)
 Group Topic: Recovery Basics  Group Date: 08/24/2023 Start Time: 1100 End Time: 1130 Facilitators: Arlan Belling, RN  Department: [facility]  Number of Participants: 8  Group Focus: abuse issues, chemical dependency education, chemical dependency issues, and coping skills Treatment Modality:  Behavior Modification Therapy Interventions utilized were assignment, clarification, confrontation, exploration, and patient education Purpose: enhance coping skills, explore maladaptive thinking, express feelings, express irrational fears, improve communication skills, increase insight, regain self-worth, reinforce self-care, relapse prevention strategies, and trigger / craving management  Name: Joseph Reese Date of Birth: 17-Jul-1965  MR: [id number]    Level of Participation: minimal Quality of Participation: attentive Interactions with others: gave feedback Mood/Affect: appropriate Triggers (if applicable):   Cognition: coherent/clear Progress: Gaining insight Response:   Plan: follow-up needed  Patients Problems:  Patient Active Problem List   Diagnosis Date Noted   Substance use disorder 08/22/2023   Cocaine abuse with cocaine-induced mood disorder (HCC) 06/27/2023   Alcohol use disorder, severe, in controlled environment (HCC) 06/27/2023   MDD (major depressive disorder), recurrent episode, severe (HCC) 06/27/2023   Substance abuse (HCC) 06/23/2023   Acute respiratory failure with hypoxia (HCC) 05/06/2021   Acute pulmonary edema (HCC) 05/06/2021   Hypertensive emergency

## 2023-08-24 NOTE — ED Notes (Signed)
 Pt BP is 152/90. NP Ene is notified.

## 2023-08-24 NOTE — ED Provider Notes (Signed)
 Facility Based Crisis Progress Note  Date: 08/24/23 Patient Name: Joseph Reese MRN: [id number]  Diagnoses:  Final diagnoses:  Alcohol use disorder  Stimulant use disorder    Joseph Reese is a 58 year old male with a past psychiatric history of alcohol use disorder and stimulant use disorder cocaine type who presents to the [facility] from [facility] for detox and substance use treatment.  Interval History Patient seen and assessed in milieu. Denies SI/HI/AVH. Eating and sleeping well. Denies acute alcohol withdrawal symptoms. Tolerating ativan taper well. Denies somatic complaint from naltrexone. Amenable to increasing naltrexone to 50 mg tonight. No acute alcohol cravings today. Continues to seek residential treatment.   PHQ 2-9:  Flowsheet Row ED from 06/23/2023 in [facility] Most recent reading at 06/27/2023  8:29 AM ED from 06/23/2023 in [facility] Most recent reading at 06/23/2023  4:18 AM Office Visit from 05/11/2021 in [facility] Most recent reading at 05/11/2021  9:36 AM  Thoughts that you would be better off dead, or of hurting yourself in some way Not at all Several days Not at all  PHQ-9 Total Score 0 19 2       Flowsheet Row ED from 08/22/2023 in [facility] Most recent reading at 08/22/2023  4:05 PM ED from 08/22/2023 in [facility] Most recent reading at 08/22/2023  4:00 AM ED from 08/16/2023 in [facility] Emergency Department at [facility] Most recent reading at 08/16/2023  9:51 PM  C-SSRS RISK CATEGORY No Risk No Risk No Risk       Screenings    Flowsheet Row Most Recent Value  CIWA-Ar Total 3       Total Time spent with patient: 30 minutes  Musculoskeletal  Strength & Muscle Tone: within normal limits Gait & Station: normal Patient leans: N/A  Psychiatric Specialty Exam   Presentation General Appearance:  Appropriate for Environment; Fairly Groomed  Eye Contact: Fair  Speech: Clear and Coherent; Normal Rate  Speech Volume: Normal  Handedness: Right   Mood and Affect  Mood: Euthymic  Affect: Congruent; Appropriate   Thought Process  Thought Processes: Coherent  Descriptions of Associations:Intact  Orientation:Full (Time, Place and Person)  Thought Content:Logical  Diagnosis of Schizophrenia or Schizoaffective disorder in past: No   Hallucinations:Hallucinations: None  Ideas of Reference:None  Suicidal Thoughts:Suicidal Thoughts: No  Homicidal Thoughts:Homicidal Thoughts: No   Sensorium  Memory: Remote Good  Judgment: Fair  Insight: Fair   Art therapist  Concentration: Good  Attention Span: Good  Recall: Good  Fund of Knowledge: Good  Language: Good   Psychomotor Activity  Psychomotor Activity: Psychomotor Activity: Normal AIMS Completed?: No   Assets  Assets: Communication Skills; Resilience; Social Support; Desire for Improvement   Sleep  Sleep: Sleep: Good   Nutritional Assessment (For OBS and FBC admissions only) Has the patient had a weight loss or gain of 10 pounds or more in the last 3 months?: No Has the patient had a decrease in food intake/or appetite?: No Does the patient have dental problems?: No Does the patient have eating habits or behaviors that may be indicators of an eating disorder including binging or inducing vomiting?: No Has the patient recently lost weight without trying?: 0 Has the patient been eating poorly because of a decreased appetite?: 0 Malnutrition Screening Tool Score: 0    Physical Exam Vitals reviewed.  Constitutional:  Appearance: Normal appearance.  HENT:     Head: Normocephalic and atraumatic.  Cardiovascular:     Rate and Rhythm: Normal rate.  Pulmonary:     Effort: Pulmonary effort is normal.  Neurological:     General: No  focal deficit present.     Mental Status: He is alert and oriented to person, place, and time.    Review of Systems  Constitutional:  Negative for chills and fever.  Respiratory:  Negative for shortness of breath.   Cardiovascular:  Negative for chest pain and palpitations.  Gastrointestinal:  Negative for nausea and vomiting.  Neurological:  Positive for tremors. Negative for headaches.    Blood pressure (!) 157/86, pulse 75, temperature 97.8 F (36.6 C), temperature source Oral, resp. rate 16, SpO2 99%. There is no height or weight on file to calculate BMI.  Past Psychiatric History:  Dx: AUD, stimulant use d/o Current psychotropic medications: Denies Previous psychotropic medications: Denies Psychiatrist: Denies Therapist: Denies  Past Medical History:  Dx: HTN Medications: per chart review, losartan  Family Psychiatric History: Denies  Social History:  Living: with daughter Job: Roofing Support: mother, daughter, brother, sister Legal History: denies  Substance History:  Smoking: few cigarettes daily Alcohol:   Onset: 35 years ago  Amount and frequency: 1.5 liquor daily  Last use: 4/9  Hx of withdrawal symptoms: states only tremors, denies Dts and seizures  Periods of sobriety: 1995-1998 Illicit drugs: yes, crack cocaine daily since he was in his 20's; uses three times weekly, $80 worth at a time, most recently used 4/9 Rehab: yes multiple times  Is the patient at risk to self? No  Has the patient been a risk to self in the past 6 months? No .    Has the patient been a risk to self within the distant past? No   Is the patient a risk to others? No   Has the patient been a risk to others in the past 6 months? No   Has the patient been a risk to others within the distant past? No   Last Labs:  Admission on 08/22/2023, Discharged on 08/22/2023  Component Date Value Ref Range Status   WBC 08/22/2023 6.1  4.0 - 10.5 K/uL Final   RBC 08/22/2023 5.43  4.22 - 5.81  MIL/uL Final   Hemoglobin 08/22/2023 15.3  13.0 - 17.0 g/dL Final   HCT 62/13/0865 45.3  39.0 - 52.0 % Final   MCV 08/22/2023 83.4  80.0 - 100.0 fL Final   MCH 08/22/2023 28.2  26.0 - 34.0 pg Final   MCHC 08/22/2023 33.8  30.0 - 36.0 g/dL Final   RDW 78/46/9629 14.1  11.5 - 15.5 % Final   Platelets 08/22/2023 293  150 - 400 K/uL Final   nRBC 08/22/2023 0.0  0.0 - 0.2 % Final   Neutrophils Relative % 08/22/2023 52  % Final   Neutro Abs 08/22/2023 3.1  1.7 - 7.7 K/uL Final   Lymphocytes Relative 08/22/2023 37  % Final   Lymphs Abs 08/22/2023 2.3  0.7 - 4.0 K/uL Final   Monocytes Relative 08/22/2023 8  % Final   Monocytes Absolute 08/22/2023 0.5  0.1 - 1.0 K/uL Final   Eosinophils Relative 08/22/2023 3  % Final   Eosinophils Absolute 08/22/2023 0.2  0.0 - 0.5 K/uL Final   Basophils Relative 08/22/2023 0  % Final   Basophils Absolute 08/22/2023 0.0  0.0 - 0.1 K/uL Final   Immature Granulocytes 08/22/2023 0  % Final  Abs Immature Granulocytes 08/22/2023 0.02  0.00 - 0.07 K/uL Final   Performed at [facility], [address] [place], [place] [address]   Sodium 08/22/2023 142  135 - 145 mmol/L Final   Potassium 08/22/2023 3.9  3.5 - 5.1 mmol/L Final   Chloride 08/22/2023 105  98 - 111 mmol/L Final   CO2 08/22/2023 24  22 - 32 mmol/L Final   Glucose, Bld 08/22/2023 82  70 - 99 mg/dL Final   Glucose reference range applies only to samples taken after fasting for at least 8 hours.   BUN 08/22/2023 8  6 - 20 mg/dL Final   Creatinine, Ser 08/22/2023 0.77  0.61 - 1.24 mg/dL Final   Calcium 98/03/9146 9.6  8.9 - 10.3 mg/dL Final   Total Protein 82/95/6213 7.6  6.5 - 8.1 g/dL Final   Albumin 08/65/7846 4.1  3.5 - 5.0 g/dL Final   AST 96/29/5284 25  15 - 41 U/L Final   ALT 08/22/2023 23  0 - 44 U/L Final   Alkaline Phosphatase 08/22/2023 59  38 - 126 U/L Final   Total Bilirubin 08/22/2023 0.6  0.0 - 1.2 mg/dL Final   GFR, Estimated 08/22/2023 >60  >60 mL/min Final   Comment:  (NOTE) Calculated using the CKD-EPI Creatinine Equation (2021)    Anion gap 08/22/2023 13  5 - 15 Final   Performed at [facility], [address], [place], [place] [address]   Alcohol, Ethyl (B) 08/22/2023 172 (H)  <10 mg/dL Final   Comment: (NOTE) Lowest detectable limit for serum alcohol is 10 mg/dL.  For medical purposes only. Performed at [facility], [address] [place], [place] [address]    TSH 08/22/2023 0.901  0.350 - 4.500 uIU/mL Final   Comment: Performed by a 3rd Generation assay with a functional sensitivity of <=0.01 uIU/mL. Performed at [facility], [address], [place], [place] [address]    POC Amphetamine UR 08/22/2023 None Detected  NONE DETECTED (Cut Off Level 1000 ng/mL) Final   POC Secobarbital (BAR) 08/22/2023 None Detected  NONE DETECTED (Cut Off Level 300 ng/mL) Final   POC Buprenorphine (BUP) 08/22/2023 None Detected  NONE DETECTED (Cut Off Level 10 ng/mL) Final   POC Oxazepam (BZO) 08/22/2023 None Detected  NONE DETECTED (Cut Off Level 300 ng/mL) Final   POC Cocaine UR 08/22/2023 None Detected  NONE DETECTED (Cut Off Level 300 ng/mL) Final   POC Methamphetamine UR 08/22/2023 None Detected  NONE DETECTED (Cut Off Level 1000 ng/mL) Final   POC Morphine 08/22/2023 None Detected  NONE DETECTED (Cut Off Level 300 ng/mL) Final   POC Methadone UR 08/22/2023 None Detected  NONE DETECTED (Cut Off Level 300 ng/mL) Final   POC Oxycodone UR 08/22/2023 None Detected  NONE DETECTED (Cut Off Level 100 ng/mL) Final   POC Marijuana UR 08/22/2023 None Detected  NONE DETECTED (Cut Off Level 50 ng/mL) Final  Admission on 08/16/2023, Discharged on 08/17/2023  Component Date Value Ref Range Status   Sodium 08/16/2023 138  135 - 145 mmol/L Final   Potassium 08/16/2023 3.6  3.5 - 5.1 mmol/L Final   Chloride 08/16/2023 108  98 - 111 mmol/L Final   CO2 08/16/2023 22  22 - 32 mmol/L Final   Glucose, Bld 08/16/2023 82  70 - 99 mg/dL Final   Glucose  reference range applies only to samples taken after fasting for at least 8 hours.   BUN 08/16/2023 9  6 -  20 mg/dL Final   Creatinine, Ser 08/16/2023 0.86  0.61 - 1.24 mg/dL Final   Calcium 40/98/1191 9.3  8.9 - 10.3 mg/dL Final   GFR, Estimated 08/16/2023 >60  >60 mL/min Final   Comment: (NOTE) Calculated using the CKD-EPI Creatinine Equation (2021)    Anion gap 08/16/2023 8  5 - 15 Final   Performed at [facility], [address], [place], [place] [address]   WBC 08/16/2023 5.7  4.0 - 10.5 K/uL Final   RBC 08/16/2023 4.68  4.22 - 5.81 MIL/uL Final   Hemoglobin 08/16/2023 13.1  13.0 - 17.0 g/dL Final   HCT 56/21/3086 38.4 (L)  39.0 - 52.0 % Final   MCV 08/16/2023 82.1  80.0 - 100.0 fL Final   MCH 08/16/2023 28.0  26.0 - 34.0 pg Final   MCHC 08/16/2023 34.1  30.0 - 36.0 g/dL Final   RDW 57/84/6962 14.3  11.5 - 15.5 % Final   Platelets 08/16/2023 276  150 - 400 K/uL Final   nRBC 08/16/2023 0.0  0.0 - 0.2 % Final   Performed at [facility], [address], [place], [place] [address]   Troponin I (High Sensitivity) 08/16/2023 21 (H)  <18 ng/L Final   Comment: (NOTE) Elevated high sensitivity troponin I (hsTnI) values and significant  changes across serial measurements may suggest ACS but many other  chronic and acute conditions are known to elevate hsTnI results.  Refer to the "Links" section for chest pain algorithms and additional  guidance. Performed at [facility], [address] [place], [place] [address]    Troponin I (High Sensitivity) 08/16/2023 20 (H)  <18 ng/L Final   Comment: (NOTE) Elevated high sensitivity troponin I (hsTnI) values and significant  changes across serial measurements may suggest ACS but many other  chronic and acute conditions are known to elevate hsTnI results.  Refer to the "Links" section for chest pain algorithms and additional  guidance. Performed at [facility], [address], [place], [place] [address]   Admission  on 06/27/2023, Discharged on 06/29/2023  Component Date Value Ref Range Status   POC Amphetamine UR 06/27/2023 None Detected  NONE DETECTED (Cut Off Level 1000 ng/mL) Final   POC Secobarbital (BAR) 06/27/2023 None Detected  NONE DETECTED (Cut Off Level 300 ng/mL) Final   POC Buprenorphine (BUP) 06/27/2023 None Detected  NONE DETECTED (Cut Off Level 10 ng/mL) Final   POC Oxazepam (BZO) 06/27/2023 None Detected  NONE DETECTED (Cut Off Level 300 ng/mL) Final   POC Cocaine UR 06/27/2023 None Detected  NONE DETECTED (Cut Off Level 300 ng/mL) Final   POC Methamphetamine UR 06/27/2023 None Detected  NONE DETECTED (Cut Off Level 1000 ng/mL) Final   POC Morphine 06/27/2023 None Detected  NONE DETECTED (Cut Off Level 300 ng/mL) Final   POC Methadone UR 06/27/2023 None Detected  NONE DETECTED (Cut Off Level 300 ng/mL) Final   POC Oxycodone UR 06/27/2023 None Detected  NONE DETECTED (Cut Off Level 100 ng/mL) Final   POC Marijuana UR 06/27/2023 Positive (A)  NONE DETECTED (Cut Off Level 50 ng/mL) Final  Admission on 06/23/2023, Discharged on 06/27/2023  Component Date Value Ref Range Status   WBC 06/23/2023 4.4  4.0 - 10.5 K/uL Final   RBC 06/23/2023 4.93  4.22 - 5.81 MIL/uL Final   Hemoglobin 06/23/2023 14.2  13.0 - 17.0 g/dL Final   HCT 25/36/6440 41.6  39.0 - 52.0 % Final   MCV 06/23/2023 84.4  80.0 - 100.0 fL Final   MCH 06/23/2023 28.8  26.0 - 34.0 pg Final   MCHC 06/23/2023 34.1  30.0 - 36.0 g/dL Final   RDW 28/41/3244 14.2  11.5 - 15.5 % Final   Platelets 06/23/2023 224  150 - 400 K/uL Final   nRBC 06/23/2023 0.0  0.0 - 0.2 % Final   Neutrophils Relative % 06/23/2023 38  % Final   Neutro Abs 06/23/2023 1.7  1.7 - 7.7 K/uL Final   Lymphocytes Relative 06/23/2023 43  % Final   Lymphs Abs 06/23/2023 1.9  0.7 - 4.0 K/uL Final   Monocytes Relative 06/23/2023 12  % Final   Monocytes Absolute 06/23/2023 0.5  0.1 - 1.0 K/uL Final   Eosinophils Relative 06/23/2023 6  % Final   Eosinophils Absolute  06/23/2023 0.3  0.0 - 0.5 K/uL Final   Basophils Relative 06/23/2023 0  % Final   Basophils Absolute 06/23/2023 0.0  0.0 - 0.1 K/uL Final   Immature Granulocytes 06/23/2023 1  % Final   Abs Immature Granulocytes 06/23/2023 0.02  0.00 - 0.07 K/uL Final   Performed at [facility], [address], [place], [place] [address]   Sodium 06/23/2023 139  135 - 145 mmol/L Final   Potassium 06/23/2023 3.7  3.5 - 5.1 mmol/L Final   Chloride 06/23/2023 107  98 - 111 mmol/L Final   CO2 06/23/2023 24  22 - 32 mmol/L Final   Glucose, Bld 06/23/2023 112 (H)  70 - 99 mg/dL Final   Glucose reference range applies only to samples taken after fasting for at least 8 hours.   BUN 06/23/2023 9  6 - 20 mg/dL Final   Creatinine, Ser 06/23/2023 0.88  0.61 - 1.24 mg/dL Final   Calcium 25/36/6440 9.3  8.9 - 10.3 mg/dL Final   Total Protein 34/74/2595 6.8  6.5 - 8.1 g/dL Final   Albumin 63/87/5643 3.6  3.5 - 5.0 g/dL Final   AST 32/95/1884 22  15 - 41 U/L Final   ALT 06/23/2023 16  0 - 44 U/L Final   Alkaline Phosphatase 06/23/2023 54  38 - 126 U/L Final   Total Bilirubin 06/23/2023 0.6  0.0 - 1.2 mg/dL Final   GFR, Estimated 06/23/2023 >60  >60 mL/min Final   Comment: (NOTE) Calculated using the CKD-EPI Creatinine Equation (2021)    Anion gap 06/23/2023 8  5 - 15 Final   Performed at [facility], [address], [place], [place] [address]   Hgb A1c MFr Bld 06/23/2023 5.1  4.8 - 5.6 % Final   Comment: (NOTE) Pre diabetes:          5.7%-6.4%  Diabetes:              >6.4%  Glycemic control for   <7.0% adults with diabetes    Mean Plasma Glucose 06/23/2023 99.67  mg/dL Final   Performed at [facility], [address] [place], [place] [address]   Alcohol, Ethyl (B) 06/23/2023 <10  <10 mg/dL Final   Comment: (NOTE) Lowest detectable limit for serum alcohol is 10 mg/dL.  For medical purposes only. Performed at [facility], [address] [place], [place] [address]    POC  Amphetamine UR 06/23/2023 None Detected  NONE DETECTED (Cut Off Level 1000 ng/mL) Final   POC Secobarbital (BAR) 06/23/2023 None Detected  NONE DETECTED (Cut Off Level 300 ng/mL) Final   POC Buprenorphine (BUP) 06/23/2023 None Detected  NONE DETECTED (  Cut Off Level 10 ng/mL) Final   POC Oxazepam (BZO) 06/23/2023 None Detected  NONE DETECTED (Cut Off Level 300 ng/mL) Final   POC Cocaine UR 06/23/2023 Positive (A)  NONE DETECTED (Cut Off Level 300 ng/mL) Final   POC Methamphetamine UR 06/23/2023 None Detected  NONE DETECTED (Cut Off Level 1000 ng/mL) Final   POC Morphine 06/23/2023 None Detected  NONE DETECTED (Cut Off Level 300 ng/mL) Final   POC Methadone UR 06/23/2023 None Detected  NONE DETECTED (Cut Off Level 300 ng/mL) Final   POC Oxycodone UR 06/23/2023 None Detected  NONE DETECTED (Cut Off Level 100 ng/mL) Final   POC Marijuana UR 06/23/2023 Positive (A)  NONE DETECTED (Cut Off Level 50 ng/mL) Final   Cholesterol 06/23/2023 192  0 - 200 mg/dL Final   Triglycerides 16/02/9603 46  <150 mg/dL Final   HDL 54/01/8118 76  >40 mg/dL Final   Total CHOL/HDL Ratio 06/23/2023 2.5  RATIO Final   VLDL 06/23/2023 9  0 - 40 mg/dL Final   LDL Cholesterol 06/23/2023 107 (H)  0 - 99 mg/dL Final   Comment:        Total Cholesterol/HDL:CHD Risk Coronary Heart Disease Risk Table                     Men   Women  1/2 Average Risk   3.4   3.3  Average Risk       5.0   4.4  2 X Average Risk   9.6   7.1  3 X Average Risk  23.4   11.0        Use the calculated Patient Ratio above and the CHD Risk Table to determine the patient's CHD Risk.        ATP III CLASSIFICATION (LDL):  <100     mg/dL   Optimal  147-829  mg/dL   Near or Above                    Optimal  130-159  mg/dL   Borderline  562-130  mg/dL   High  >865     mg/dL   Very High Performed at [facility], [address], [place], [place] [address]    TSH 06/23/2023 1.229  0.350 - 4.500 uIU/mL Final   Comment: Performed by a 3rd  Generation assay with a functional sensitivity of <=0.01 uIU/mL. Performed at [facility], [address], [place], [place] [address]   Admission on 06/20/2023, Discharged on 06/20/2023  Component Date Value Ref Range Status   Sodium 06/20/2023 134 (L)  135 - 145 mmol/L Final   Potassium 06/20/2023 3.9  3.5 - 5.1 mmol/L Final   Chloride 06/20/2023 100  98 - 111 mmol/L Final   CO2 06/20/2023 22  22 - 32 mmol/L Final   Glucose, Bld 06/20/2023 77  70 - 99 mg/dL Final   Glucose reference range applies only to samples taken after fasting for at least 8 hours.   BUN 06/20/2023 8  6 - 20 mg/dL Final   Creatinine, Ser 06/20/2023 0.85  0.61 - 1.24 mg/dL Final   Calcium 84/13/2440 9.0  8.9 - 10.3 mg/dL Final   GFR, Estimated 06/20/2023 >60  >60 mL/min Final   Comment: (NOTE) Calculated using the CKD-EPI Creatinine Equation (2021)    Anion gap 06/20/2023 12  5 - 15 Final   Performed at [facility], [address] [place], [place] [address]  WBC 06/20/2023 5.0  4.0 - 10.5 K/uL Final   RBC 06/20/2023 5.19  4.22 - 5.81 MIL/uL Final   Hemoglobin 06/20/2023 15.2  13.0 - 17.0 g/dL Final   HCT 13/12/6576 43.9  39.0 - 52.0 % Final   MCV 06/20/2023 84.6  80.0 - 100.0 fL Final   MCH 06/20/2023 29.3  26.0 - 34.0 pg Final   MCHC 06/20/2023 34.6  30.0 - 36.0 g/dL Final   RDW 46/96/2952 14.1  11.5 - 15.5 % Final   Platelets 06/20/2023 239  150 - 400 K/uL Final   nRBC 06/20/2023 0.0  0.0 - 0.2 % Final   Performed at [facility], [address], [place], [place] [address]   Troponin I (High Sensitivity) 06/20/2023 21 (H)  <18 ng/L Final   Comment: (NOTE) Elevated high sensitivity troponin I (hsTnI) values and significant  changes across serial measurements may suggest ACS but many other  chronic and acute conditions are known to elevate hsTnI results.  Refer to the "Links" section for chest pain algorithms and additional  guidance. Performed at [facility], [address], [place], [place] [address]    B Natriuretic Peptide 06/20/2023 54.9  0.0 - 100.0 pg/mL Final   Performed at [facility], [address] [place], [place] [address]   Troponin I (High Sensitivity) 06/20/2023 19 (H)  <18 ng/L Final   Comment: (NOTE) Elevated high sensitivity troponin I (hsTnI) values and significant  changes across serial measurements may suggest ACS but many other  chronic and acute conditions are known to elevate hsTnI results.  Refer to the "Links" section for chest pain algorithms and additional  guidance. Performed at [facility], [address], [place], [place] [address]   Admission on 06/17/2023, Discharged on 06/18/2023  Component Date Value Ref Range Status   Sodium 06/17/2023 137  135 - 145 mmol/L Final   Potassium 06/17/2023 3.4 (L)  3.5 - 5.1 mmol/L Final   Chloride 06/17/2023 102  98 - 111 mmol/L Final   CO2 06/17/2023 23  22 - 32 mmol/L Final   Glucose, Bld 06/17/2023 84  70 - 99 mg/dL Final   Glucose reference range applies only to samples taken after fasting for at least 8 hours.   BUN 06/17/2023 5 (L)  6 - 20 mg/dL Final   Creatinine, Ser 06/17/2023 1.01  0.61 - 1.24 mg/dL Final   Calcium 34/74/2595 8.9  8.9 - 10.3 mg/dL Final   GFR, Estimated 06/17/2023 >60  >60 mL/min Final   Comment: (NOTE) Calculated using the CKD-EPI Creatinine Equation (2021)    Anion gap 06/17/2023 12  5 - 15 Final   Performed at [facility], [address], [place], [place] [address]   WBC 06/17/2023 5.0  4.0 - 10.5 K/uL Final   RBC 06/17/2023 4.63  4.22 - 5.81 MIL/uL Final   Hemoglobin 06/17/2023 13.4  13.0 - 17.0 g/dL Final   HCT 64/33/2951 39.7  39.0 - 52.0 % Final   MCV 06/17/2023 85.7  80.0 - 100.0 fL Final   MCH 06/17/2023 28.9  26.0 - 34.0 pg Final   MCHC 06/17/2023 33.8  30.0 - 36.0 g/dL Final   RDW 88/41/6606 14.6  11.5 - 15.5 % Final   Platelets 06/17/2023 234  150 - 400 K/uL Final   nRBC 06/17/2023 0.0  0.0 - 0.2 % Final   Performed at  [facility] Lab, [address], [place], [place]  [address]   Troponin I (High Sensitivity) 06/17/2023 36 (H)  <18 ng/L Final   Comment: (NOTE) Elevated high sensitivity troponin I (hsTnI) values and significant  changes across serial measurements may suggest ACS but many other  chronic and acute conditions are known to elevate hsTnI results.  Refer to the "Links" section for chest pain algorithms and additional  guidance. Performed at [facility], [address] [place], [place] [address]    B Natriuretic Peptide 06/17/2023 185.1 (H)  0.0 - 100.0 pg/mL Final   Performed at [facility], [address] [place], [place] [address]   SARS Coronavirus 2 by RT PCR 06/17/2023 NEGATIVE  NEGATIVE Final   Influenza A by PCR 06/17/2023 NEGATIVE  NEGATIVE Final   Influenza B by PCR 06/17/2023 NEGATIVE  NEGATIVE Final   Comment: (NOTE) The Xpert Xpress SARS-CoV-2/FLU/RSV plus assay is intended as an aid in the diagnosis of influenza from Nasopharyngeal swab specimens and should not be used as a sole basis for treatment. Nasal washings and aspirates are unacceptable for Xpert Xpress SARS-CoV-2/FLU/RSV testing.  Fact Sheet for Patients: BloggerCourse.com  Fact Sheet for Healthcare Providers: SeriousBroker.it  This test is not yet approved or cleared by the [place]  FDA and has been authorized for detection and/or diagnosis of SARS-CoV-2 by FDA under an Emergency Use Authorization (EUA). This EUA will remain in effect (meaning this test can be used) for the duration of the COVID-19 declaration under Section 564(b)(1) of the Act, 21 U.S.C. section 360bbb-3(b)(1), unless the authorization is terminated or revoked.     Resp Syncytial Virus by PCR 06/17/2023 NEGATIVE  NEGATIVE Final   Comment: (NOTE) Fact Sheet for Patients: BloggerCourse.com  Fact Sheet for Healthcare  Providers: SeriousBroker.it  This test is not yet approved or cleared by the [place]  FDA and has been authorized for detection and/or diagnosis of SARS-CoV-2 by FDA under an Emergency Use Authorization (EUA). This EUA will remain in effect (meaning this test can be used) for the duration of the COVID-19 declaration under Section 564(b)(1) of the Act, 21 U.S.C. section 360bbb-3(b)(1), unless the authorization is terminated or revoked.  Performed at [facility], [address] [place], [place] [address]    Troponin I (High Sensitivity) 06/17/2023 37 (H)  <18 ng/L Final   Comment: (NOTE) Elevated high sensitivity troponin I (hsTnI) values and significant  changes across serial measurements may suggest ACS but many other  chronic and acute conditions are known to elevate hsTnI results.  Refer to the "Links" section for chest pain algorithms and additional  guidance. Performed at [facility], [address], [place], [place] [address]     Allergies: Patient has no known allergies.  Medications:  Facility Ordered Medications  Medication   [COMPLETED] thiamine (VITAMIN B1) injection 100 mg   LORazepam (ATIVAN) tablet 1 mg   [COMPLETED] LORazepam (ATIVAN) tablet 1 mg   Followed by   [COMPLETED] LORazepam (ATIVAN) tablet 1 mg   Followed by   LORazepam (ATIVAN) tablet 1 mg   Followed by   Cecily Cohen ON 08/25/2023] LORazepam (ATIVAN) tablet 1 mg   acetaminophen (TYLENOL) tablet 650 mg   alum & mag hydroxide-simeth (MAALOX/MYLANTA) 200-200-20 MG/5ML suspension 30 mL   magnesium hydroxide (MILK OF MAGNESIA) suspension 30 mL   haloperidol (HALDOL) tablet 5 mg   And   diphenhydrAMINE (BENADRYL) capsule 50 mg   haloperidol lactate (HALDOL) injection 5 mg   And   diphenhydrAMINE (BENADRYL) injection 50 mg  And   LORazepam (ATIVAN) injection 2 mg   haloperidol lactate (HALDOL) injection 10 mg   And   diphenhydrAMINE (BENADRYL) injection 50  mg   And   LORazepam (ATIVAN) injection 2 mg   hydrOXYzine (ATARAX) tablet 25 mg   traZODone (DESYREL) tablet 50 mg   [COMPLETED] cloNIDine (CATAPRES) tablet 0.1 mg   multivitamin with minerals tablet 1 tablet   loperamide (IMODIUM) capsule 2-4 mg   ondansetron (ZOFRAN-ODT) disintegrating tablet 4 mg   thiamine (VITAMIN B1) tablet 100 mg   [COMPLETED] cloNIDine (CATAPRES) tablet 0.1 mg   cloNIDine (CATAPRES) tablet 0.1 mg   [START ON 08/25/2023] losartan (COZAAR) tablet 100 mg   naltrexone (DEPADE) tablet 50 mg    Long Term Goals: Improvement in symptoms so as ready for discharge  Short Term Goals: Patient will verbalize feelings in meetings with treatment team members., Patient will attend at least of 50% of the groups daily., Patient will participate in completing the [place] Suicide Severity Rating Scale, Patient will score a low risk of violence for 24 hours prior to discharge, and Patient will take medications as prescribed daily.  Medical Decision Making  Alcohol Use Disorder EtOH 172, LFTs WNL -Ativan taper -CIWA with Ativan as needed for CIWA greater than 10  -Last CIWA score is 2 on 4/11 at 6AM -Increase Naltrexone to 50 mg qhs -Thiamine 100 mg IM first day and PO after that -Multivitamin with minerals daily -Tylenol 650 mg every 6 hours as needed for pain -Zofran 4 mg every 6 hours as needed for nausea or vomiting -Imodium 2 to 4 mg as needed for diarrhea or loose stools  -Maalox/Mylanta 30 mL every 4 hours as needed for indigestion -Milk of Mag 30 mL as needed for constipation   Stimulant Use Disorder (cocaine type) -Encourage cessation -PRN Tylenol, maalox/mylanta, milk of magnesia   Medical:  HTN- increase losartan to 100 mg daily for HTN (previous rx)  -clonidine 0.1 mg tid prn for SBP>150 and DBP>100  Recommendations  Based on my evaluation the patient does not appear to have an emergency medical condition.  Augusta Blizzard, MD 08/24/23  11:30 AM

## 2023-08-24 NOTE — ED Notes (Signed)
 Patient b/p 152/97 75 patient is asymptomatic MD made aware.

## 2023-08-24 NOTE — Group Note (Signed)
 Group Topic: Decisional Balance/Substance Abuse  Group Date: 08/23/2023 Start Time: 1930 End Time: 2000 Facilitators: Alvino Joseph, NT  Department: [facility]  Number of Participants: 5  Group Focus: abuse issues Treatment Modality:  Individual Therapy Interventions utilized were clarification Purpose: enhance coping skills  Name: Joseph Reese Date of Birth: [birth year]  MR: [id number]    Level of Participation: moderate Quality of Participation: cooperative Interactions with others: n/a Mood/Affect: appropriate Triggers (if applicable): n/a Cognition: coherent/clear Progress: Moderate Response: n/a Plan: follow-up needed  Patients Problems:  Patient Active Problem List   Diagnosis Date Noted   Substance use disorder 08/22/2023   Cocaine abuse with cocaine-induced mood disorder (HCC) 06/27/2023   Alcohol use disorder, severe, in controlled environment (HCC) 06/27/2023   MDD (major depressive disorder), recurrent episode, severe (HCC) 06/27/2023   Substance abuse (HCC) 06/23/2023   Acute respiratory failure with hypoxia (HCC) 05/06/2021   Acute pulmonary edema (HCC) 05/06/2021   Hypertensive emergency

## 2023-08-24 NOTE — ED Notes (Signed)
 Patient b/p elevated this morning at 154/97 67  patient given antihypertensive medication and ativan early.  B/p retaken now and remains elevated at 157/86  Dr Leia Pun made aware.

## 2023-08-24 NOTE — ED Notes (Signed)
 Pt is in the dayroom watching TV with peers. Pt denies SI/HI/AVH. Pt has no further complain.No acute distress noted. Will continue to monitor for safety and provide support.

## 2023-08-25 DIAGNOSIS — F1010 Alcohol abuse, uncomplicated: Secondary | ICD-10-CM | POA: Diagnosis not present

## 2023-08-25 DIAGNOSIS — E785 Hyperlipidemia, unspecified: Secondary | ICD-10-CM | POA: Diagnosis not present

## 2023-08-25 DIAGNOSIS — F1490 Cocaine use, unspecified, uncomplicated: Secondary | ICD-10-CM | POA: Diagnosis not present

## 2023-08-25 DIAGNOSIS — F1590 Other stimulant use, unspecified, uncomplicated: Secondary | ICD-10-CM | POA: Diagnosis not present

## 2023-08-25 MED ORDER — FUROSEMIDE 40 MG PO TABS: 40.0000 mg | ORAL_TABLET | Freq: Every day | ORAL | Status: DC | PRN

## 2023-08-25 MED ORDER — AMLODIPINE BESYLATE 5 MG PO TABS
5.0000 mg | ORAL_TABLET | Freq: Every day | ORAL | Status: DC
  Administered 2023-08-25: 5 mg via ORAL
  Filled 2023-08-25: qty 1

## 2023-08-25 MED ORDER — SPIRONOLACTONE 25 MG PO TABS: 25.0000 mg | ORAL_TABLET | Freq: Every day | ORAL | Status: DC

## 2023-08-25 MED ORDER — SACUBITRIL-VALSARTAN 24-26 MG PO TABS
1.0000 | ORAL_TABLET | Freq: Two times a day (BID) | ORAL | Status: DC
  Administered 2023-08-25 – 2023-08-28 (×8): 1 via ORAL
  Filled 2023-08-25 (×8): qty 1
  Filled 2023-08-25: qty 28
  Filled 2023-08-25 (×2): qty 1

## 2023-08-25 MED ORDER — CARVEDILOL 12.5 MG PO TABS
12.5000 mg | ORAL_TABLET | Freq: Two times a day (BID) | ORAL | Status: DC
  Administered 2023-08-25 – 2023-08-28 (×7): 12.5 mg via ORAL
  Filled 2023-08-25 (×4): qty 1
  Filled 2023-08-25: qty 28
  Filled 2023-08-25 (×3): qty 1

## 2023-08-25 MED ORDER — ATORVASTATIN CALCIUM 40 MG PO TABS
80.0000 mg | ORAL_TABLET | Freq: Every day | ORAL | Status: DC
  Administered 2023-08-25 – 2023-08-28 (×4): 80 mg via ORAL
  Filled 2023-08-25 (×4): qty 2
  Filled 2023-08-25: qty 28

## 2023-08-25 NOTE — ED Notes (Signed)
 Pt is in the dayroom watching TV with peers. Pt expressed gladness regarding his BP medication being fixed Pt denies SI/HI/AVH. Pt has no further complain.No acute distress noted. Will continue to monitor for safety and provide support.

## 2023-08-25 NOTE — ED Notes (Signed)
 Patient is sleeping. Respirations equal and unlabored, skin warm and dry. No change in assessment or acuity. Routine safety checks conducted according to facility protocol. Will continue to monitor for safety.

## 2023-08-25 NOTE — ED Notes (Signed)
 BP is 152/86. NP Angelica Kemp is notified

## 2023-08-25 NOTE — ED Notes (Signed)
 Patient sitting in dayroom interacting with peers. No acute distress noted. No concerns voiced. Informed patient to notify staff with any needs or assistance. Patient verbalized understanding or agreement. Safety checks in place per facility policy.

## 2023-08-25 NOTE — Group Note (Signed)
 Group Topic: Wellness  Group Date: 08/25/2023 Start Time: 1000 End Time: 1030 Facilitators: Dee Farber D, NT  Department: [facility]  Number of Participants: 10  Group Focus: relapse prevention, relaxation, self-awareness, social skills, and substance abuse education Treatment Modality:  Psychoeducation Interventions utilized were support Purpose: enhance coping skills, increase insight, reinforce self-care, and relapse prevention strategies  Name: Joseph Reese Date of Birth: 28-Sep-1965  MR: [id number]    Level of Participation: moderate Quality of Participation: cooperative Interactions with others: gave feedback Mood/Affect: appropriate Triggers (if applicable): N/A Cognition: coherent/clear and concrete Progress: Significant Response: N/A Plan: follow-up needed  Patients Problems:  Patient Active Problem List   Diagnosis Date Noted   Substance use disorder 08/22/2023   Cocaine abuse with cocaine-induced mood disorder (HCC) 06/27/2023   Alcohol use disorder, severe, in controlled environment (HCC) 06/27/2023   MDD (major depressive disorder), recurrent episode, severe (HCC) 06/27/2023   Substance abuse (HCC) 06/23/2023   Acute respiratory failure with hypoxia (HCC) 05/06/2021   Acute pulmonary edema (HCC) 05/06/2021   Hypertensive emergency

## 2023-08-25 NOTE — ED Provider Notes (Addendum)
 Facility Based Crisis Progress Note  Date: 08/25/23 Patient Name: Joseph Reese MRN: [id number]  Diagnoses:  Final diagnoses:  Alcohol use disorder  Stimulant use disorder    Jaclyn Carew is a 58 year old male with a past psychiatric history of alcohol use disorder and stimulant use disorder cocaine type who presents to the [facility] from [facility] for detox and substance use treatment.  Interval History Patient seen and assessed in milieu. Denies SI/HI/AVH. Eating and sleeping well. Denies acute alcohol withdrawal symptoms but does mention some headache likely secondary to high BP. Tolerating ativan taper well. Denies somatic complaint from naltrexone. No somatic complaint from increased naltrexone. No acute alcohol cravings today. Continues to seek residential treatment. He wanted to restart his BP medications which he was amenable to do.   PHQ 2-9:  Flowsheet Row ED from 06/23/2023 in [facility] Most recent reading at 06/27/2023  8:29 AM ED from 06/23/2023 in [facility] Most recent reading at 06/23/2023  4:18 AM Office Visit from 05/11/2021 in [facility] [place] Most recent reading at 05/11/2021  9:36 AM  Thoughts that you would be better off dead, or of hurting yourself in some way Not at all Several days Not at all  PHQ-9 Total Score 0 19 2       Flowsheet Row ED from 08/22/2023 in [facility] Most recent reading at 08/22/2023  4:05 PM ED from 08/22/2023 in [facility] Most recent reading at 08/22/2023  4:00 AM ED from 08/16/2023 in [facility] Emergency Department at [facility] Most recent reading at 08/16/2023  9:51 PM  C-SSRS RISK CATEGORY No Risk No Risk No Risk       Screenings    Flowsheet Row Most Recent Value  CIWA-Ar Total 3       Total Time spent with patient: 30 minutes  Musculoskeletal  Strength &  Muscle Tone: within normal limits Gait & Station: normal Patient leans: N/A  Psychiatric Specialty Exam  Presentation General Appearance:  Appropriate for Environment; Casual  Eye Contact: Good  Speech: Clear and Coherent; Normal Rate  Speech Volume: Normal  Handedness: Right   Mood and Affect  Mood: Euthymic  Affect: Appropriate; Congruent   Thought Process  Thought Processes: Coherent; Goal Directed; Linear  Descriptions of Associations:Intact  Orientation:Full (Time, Place and Person)  Thought Content:Logical  Diagnosis of Schizophrenia or Schizoaffective disorder in past: No   Hallucinations:Hallucinations: None   Ideas of Reference:None  Suicidal Thoughts:Suicidal Thoughts: No   Homicidal Thoughts:Homicidal Thoughts: No    Sensorium  Memory: Immediate Good; Recent Good; Remote Good  Judgment: Fair  Insight: Fair   Art therapist  Concentration: Good  Attention Span: Good  Recall: Good  Fund of Knowledge: Good  Language: Good   Psychomotor Activity  Psychomotor Activity: Psychomotor Activity: Normal    Assets  Assets: Communication Skills; Desire for Improvement; Physical Health        Physical Exam Vitals reviewed.  Constitutional:      Appearance: Normal appearance.  HENT:     Head: Normocephalic and atraumatic.  Cardiovascular:     Rate and Rhythm: Normal rate.  Pulmonary:     Effort: Pulmonary effort is normal.  Neurological:     General: No focal deficit present.     Mental Status: He is alert and oriented to person, place, and time.   Review of Systems  Constitutional:  Negative for chills and fever.  Respiratory:  Negative for shortness of breath.   Cardiovascular:  Negative for chest pain and palpitations.  Gastrointestinal:  Negative for nausea and vomiting.  Neurological:  Positive for tremors. Negative for headaches.    Blood pressure (!) 152/96, pulse 74, temperature 98.1 F  (36.7 C), temperature source Oral, resp. rate 16, SpO2 100%. There is no height or weight on file to calculate BMI.  Past Psychiatric History:  Dx: AUD, stimulant use d/o Current psychotropic medications: Denies Previous psychotropic medications: Denies Psychiatrist: Denies Therapist: Denies  Past Medical History:  Dx: HTN Medications: per chart review, losartan  Family Psychiatric History: Denies  Social History:  Living: with daughter Job: Roofing Support: mother, daughter, brother, sister Legal History: denies  Substance History:  Smoking: few cigarettes daily Alcohol:   Onset: 35 years ago  Amount and frequency: 1.5 liquor daily  Last use: 4/9  Hx of withdrawal symptoms: states only tremors, denies Dts and seizures  Periods of sobriety: 1995-1998 Illicit drugs: yes, crack cocaine daily since he was in his 20's; uses three times weekly, $80 worth at a time, most recently used 4/9 Rehab: yes multiple times  Is the patient at risk to self? No  Has the patient been a risk to self in the past 6 months? No .    Has the patient been a risk to self within the distant past? No   Is the patient a risk to others? No   Has the patient been a risk to others in the past 6 months? No   Has the patient been a risk to others within the distant past? No   Last Labs:  Admission on 08/22/2023, Discharged on 08/22/2023  Component Date Value Ref Range Status   WBC 08/22/2023 6.1  4.0 - 10.5 K/uL Final   RBC 08/22/2023 5.43  4.22 - 5.81 MIL/uL Final   Hemoglobin 08/22/2023 15.3  13.0 - 17.0 g/dL Final   HCT 16/02/9603 45.3  39.0 - 52.0 % Final   MCV 08/22/2023 83.4  80.0 - 100.0 fL Final   MCH 08/22/2023 28.2  26.0 - 34.0 pg Final   MCHC 08/22/2023 33.8  30.0 - 36.0 g/dL Final   RDW 54/01/8118 14.1  11.5 - 15.5 % Final   Platelets 08/22/2023 293  150 - 400 K/uL Final   nRBC 08/22/2023 0.0  0.0 - 0.2 % Final   Neutrophils Relative % 08/22/2023 52  % Final   Neutro Abs 08/22/2023 3.1   1.7 - 7.7 K/uL Final   Lymphocytes Relative 08/22/2023 37  % Final   Lymphs Abs 08/22/2023 2.3  0.7 - 4.0 K/uL Final   Monocytes Relative 08/22/2023 8  % Final   Monocytes Absolute 08/22/2023 0.5  0.1 - 1.0 K/uL Final   Eosinophils Relative 08/22/2023 3  % Final   Eosinophils Absolute 08/22/2023 0.2  0.0 - 0.5 K/uL Final   Basophils Relative 08/22/2023 0  % Final   Basophils Absolute 08/22/2023 0.0  0.0 - 0.1 K/uL Final   Immature Granulocytes 08/22/2023 0  % Final   Abs Immature Granulocytes 08/22/2023 0.02  0.00 - 0.07 K/uL Final   Performed at [facility], [address] [place], [place] [address]   Sodium 08/22/2023 142  135 - 145 mmol/L Final   Potassium 08/22/2023 3.9  3.5 - 5.1 mmol/L Final   Chloride 08/22/2023 105  98 - 111 mmol/L Final   CO2 08/22/2023 24  22 - 32  mmol/L Final   Glucose, Bld 08/22/2023 82  70 - 99 mg/dL Final   Glucose reference range applies only to samples taken after fasting for at least 8 hours.   BUN 08/22/2023 8  6 - 20 mg/dL Final   Creatinine, Ser 08/22/2023 0.77  0.61 - 1.24 mg/dL Final   Calcium 09/81/1914 9.6  8.9 - 10.3 mg/dL Final   Total Protein 78/29/5621 7.6  6.5 - 8.1 g/dL Final   Albumin 30/86/5784 4.1  3.5 - 5.0 g/dL Final   AST 69/62/9528 25  15 - 41 U/L Final   ALT 08/22/2023 23  0 - 44 U/L Final   Alkaline Phosphatase 08/22/2023 59  38 - 126 U/L Final   Total Bilirubin 08/22/2023 0.6  0.0 - 1.2 mg/dL Final   GFR, Estimated 08/22/2023 >60  >60 mL/min Final   Comment: (NOTE) Calculated using the CKD-EPI Creatinine Equation (2021)    Anion gap 08/22/2023 13  5 - 15 Final   Performed at [facility], [address], [place], [place] [address]   Alcohol, Ethyl (B) 08/22/2023 172 (H)  <10 mg/dL Final   Comment: (NOTE) Lowest detectable limit for serum alcohol is 10 mg/dL.  For medical purposes only. Performed at [facility], [address] [place], [place] [address]    TSH 08/22/2023 0.901  0.350 - 4.500 uIU/mL  Final   Comment: Performed by a 3rd Generation assay with a functional sensitivity of <=0.01 uIU/mL. Performed at [facility], [address] [place], [place] [address]    POC Amphetamine UR 08/22/2023 None Detected  NONE DETECTED (Cut Off Level 1000 ng/mL) Final   POC Secobarbital (BAR) 08/22/2023 None Detected  NONE DETECTED (Cut Off Level 300 ng/mL) Final   POC Buprenorphine (BUP) 08/22/2023 None Detected  NONE DETECTED (Cut Off Level 10 ng/mL) Final   POC Oxazepam (BZO) 08/22/2023 None Detected  NONE DETECTED (Cut Off Level 300 ng/mL) Final   POC Cocaine UR 08/22/2023 None Detected  NONE DETECTED (Cut Off Level 300 ng/mL) Final   POC Methamphetamine UR 08/22/2023 None Detected  NONE DETECTED (Cut Off Level 1000 ng/mL) Final   POC Morphine 08/22/2023 None Detected  NONE DETECTED (Cut Off Level 300 ng/mL) Final   POC Methadone UR 08/22/2023 None Detected  NONE DETECTED (Cut Off Level 300 ng/mL) Final   POC Oxycodone UR 08/22/2023 None Detected  NONE DETECTED (Cut Off Level 100 ng/mL) Final   POC Marijuana UR 08/22/2023 None Detected  NONE DETECTED (Cut Off Level 50 ng/mL) Final  Admission on 08/16/2023, Discharged on 08/17/2023  Component Date Value Ref Range Status   Sodium 08/16/2023 138  135 - 145 mmol/L Final   Potassium 08/16/2023 3.6  3.5 - 5.1 mmol/L Final   Chloride 08/16/2023 108  98 - 111 mmol/L Final   CO2 08/16/2023 22  22 - 32 mmol/L Final   Glucose, Bld 08/16/2023 82  70 - 99 mg/dL Final   Glucose reference range applies only to samples taken after fasting for at least 8 hours.   BUN 08/16/2023 9  6 - 20 mg/dL Final   Creatinine, Ser 08/16/2023 0.86  0.61 - 1.24 mg/dL Final   Calcium 64/40/3474 9.3  8.9 - 10.3 mg/dL Final   GFR, Estimated 08/16/2023 >60  >60 mL/min Final   Comment: (NOTE) Calculated using the CKD-EPI Creatinine Equation (2021)    Anion gap 08/16/2023 8  5 - 15 Final   Performed at [facility], [address] [place], [place] [address]    WBC 08/16/2023 5.7  4.0 - 10.5 K/uL Final   RBC 08/16/2023 4.68  4.22 - 5.81 MIL/uL Final   Hemoglobin 08/16/2023 13.1  13.0 - 17.0 g/dL Final   HCT 69/62/9528 38.4 (L)  39.0 - 52.0 % Final   MCV 08/16/2023 82.1  80.0 - 100.0 fL Final   MCH 08/16/2023 28.0  26.0 - 34.0 pg Final   MCHC 08/16/2023 34.1  30.0 - 36.0 g/dL Final   RDW 41/32/4401 14.3  11.5 - 15.5 % Final   Platelets 08/16/2023 276  150 - 400 K/uL Final   nRBC 08/16/2023 0.0  0.0 - 0.2 % Final   Performed at [facility], [address], [place], [place] [address]   Troponin I (High Sensitivity) 08/16/2023 21 (H)  <18 ng/L Final   Comment: (NOTE) Elevated high sensitivity troponin I (hsTnI) values and significant  changes across serial measurements may suggest ACS but many other  chronic and acute conditions are known to elevate hsTnI results.  Refer to the "Links" section for chest pain algorithms and additional  guidance. Performed at [facility], [address], [place], [place] [address]    Troponin I (High Sensitivity) 08/16/2023 20 (H)  <18 ng/L Final   Comment: (NOTE) Elevated high sensitivity troponin I (hsTnI) values and significant  changes across serial measurements may suggest ACS but many other  chronic and acute conditions are known to elevate hsTnI results.  Refer to the "Links" section for chest pain algorithms and additional  guidance. Performed at [facility], [address] [place], [place] [address]   Admission on 06/27/2023, Discharged on 06/29/2023  Component Date Value Ref Range Status   POC Amphetamine UR 06/27/2023 None Detected  NONE DETECTED (Cut Off Level 1000 ng/mL) Final   POC Secobarbital (BAR) 06/27/2023 None Detected  NONE DETECTED (Cut Off Level 300 ng/mL) Final   POC Buprenorphine (BUP) 06/27/2023 None Detected  NONE DETECTED (Cut Off Level 10 ng/mL) Final   POC Oxazepam (BZO) 06/27/2023 None Detected  NONE DETECTED (Cut Off Level 300 ng/mL) Final   POC Cocaine UR  06/27/2023 None Detected  NONE DETECTED (Cut Off Level 300 ng/mL) Final   POC Methamphetamine UR 06/27/2023 None Detected  NONE DETECTED (Cut Off Level 1000 ng/mL) Final   POC Morphine 06/27/2023 None Detected  NONE DETECTED (Cut Off Level 300 ng/mL) Final   POC Methadone UR 06/27/2023 None Detected  NONE DETECTED (Cut Off Level 300 ng/mL) Final   POC Oxycodone UR 06/27/2023 None Detected  NONE DETECTED (Cut Off Level 100 ng/mL) Final   POC Marijuana UR 06/27/2023 Positive (A)  NONE DETECTED (Cut Off Level 50 ng/mL) Final  Admission on 06/23/2023, Discharged on 06/27/2023  Component Date Value Ref Range Status   WBC 06/23/2023 4.4  4.0 - 10.5 K/uL Final   RBC 06/23/2023 4.93  4.22 - 5.81 MIL/uL Final   Hemoglobin 06/23/2023 14.2  13.0 - 17.0 g/dL Final   HCT 25/95/6387 41.6  39.0 - 52.0 % Final   MCV 06/23/2023 84.4  80.0 - 100.0 fL Final   MCH 06/23/2023 28.8  26.0 - 34.0 pg Final   MCHC 06/23/2023 34.1  30.0 - 36.0 g/dL Final   RDW 56/43/3295 14.2  11.5 - 15.5 % Final   Platelets 06/23/2023 224  150 - 400 K/uL Final   nRBC 06/23/2023 0.0  0.0 - 0.2 % Final   Neutrophils Relative % 06/23/2023 38  %  Final   Neutro Abs 06/23/2023 1.7  1.7 - 7.7 K/uL Final   Lymphocytes Relative 06/23/2023 43  % Final   Lymphs Abs 06/23/2023 1.9  0.7 - 4.0 K/uL Final   Monocytes Relative 06/23/2023 12  % Final   Monocytes Absolute 06/23/2023 0.5  0.1 - 1.0 K/uL Final   Eosinophils Relative 06/23/2023 6  % Final   Eosinophils Absolute 06/23/2023 0.3  0.0 - 0.5 K/uL Final   Basophils Relative 06/23/2023 0  % Final   Basophils Absolute 06/23/2023 0.0  0.0 - 0.1 K/uL Final   Immature Granulocytes 06/23/2023 1  % Final   Abs Immature Granulocytes 06/23/2023 0.02  0.00 - 0.07 K/uL Final   Performed at [facility], [address], [place], [place] [address]   Sodium 06/23/2023 139  135 - 145 mmol/L Final   Potassium 06/23/2023 3.7  3.5 - 5.1 mmol/L Final   Chloride 06/23/2023 107  98 - 111 mmol/L Final    CO2 06/23/2023 24  22 - 32 mmol/L Final   Glucose, Bld 06/23/2023 112 (H)  70 - 99 mg/dL Final   Glucose reference range applies only to samples taken after fasting for at least 8 hours.   BUN 06/23/2023 9  6 - 20 mg/dL Final   Creatinine, Ser 06/23/2023 0.88  0.61 - 1.24 mg/dL Final   Calcium 60/45/4098 9.3  8.9 - 10.3 mg/dL Final   Total Protein 11/91/4782 6.8  6.5 - 8.1 g/dL Final   Albumin 95/62/1308 3.6  3.5 - 5.0 g/dL Final   AST 65/78/4696 22  15 - 41 U/L Final   ALT 06/23/2023 16  0 - 44 U/L Final   Alkaline Phosphatase 06/23/2023 54  38 - 126 U/L Final   Total Bilirubin 06/23/2023 0.6  0.0 - 1.2 mg/dL Final   GFR, Estimated 06/23/2023 >60  >60 mL/min Final   Comment: (NOTE) Calculated using the CKD-EPI Creatinine Equation (2021)    Anion gap 06/23/2023 8  5 - 15 Final   Performed at [facility], [address], [place], [place] [address]   Hgb A1c MFr Bld 06/23/2023 5.1  4.8 - 5.6 % Final   Comment: (NOTE) Pre diabetes:          5.7%-6.4%  Diabetes:              >6.4%  Glycemic control for   <7.0% adults with diabetes    Mean Plasma Glucose 06/23/2023 99.67  mg/dL Final   Performed at [facility], [address], [place], [place] [address]   Alcohol, Ethyl (B) 06/23/2023 <10  <10 mg/dL Final   Comment: (NOTE) Lowest detectable limit for serum alcohol is 10 mg/dL.  For medical purposes only. Performed at [facility], [address], [place], [place] [address]    POC Amphetamine UR 06/23/2023 None Detected  NONE DETECTED (Cut Off Level 1000 ng/mL) Final   POC Secobarbital (BAR) 06/23/2023 None Detected  NONE DETECTED (Cut Off Level 300 ng/mL) Final   POC Buprenorphine (BUP) 06/23/2023 None Detected  NONE DETECTED (Cut Off Level 10 ng/mL) Final   POC Oxazepam (BZO) 06/23/2023 None Detected  NONE DETECTED (Cut Off Level 300 ng/mL) Final   POC Cocaine UR 06/23/2023 Positive (A)  NONE DETECTED (Cut Off Level 300 ng/mL) Final   POC Methamphetamine  UR 06/23/2023 None Detected  NONE DETECTED (Cut Off Level 1000 ng/mL) Final   POC Morphine 06/23/2023 None Detected  NONE DETECTED (Cut Off  Level 300 ng/mL) Final   POC Methadone UR 06/23/2023 None Detected  NONE DETECTED (Cut Off Level 300 ng/mL) Final   POC Oxycodone UR 06/23/2023 None Detected  NONE DETECTED (Cut Off Level 100 ng/mL) Final   POC Marijuana UR 06/23/2023 Positive (A)  NONE DETECTED (Cut Off Level 50 ng/mL) Final   Cholesterol 06/23/2023 192  0 - 200 mg/dL Final   Triglycerides 78/46/9629 46  <150 mg/dL Final   HDL 52/84/1324 76  >40 mg/dL Final   Total CHOL/HDL Ratio 06/23/2023 2.5  RATIO Final   VLDL 06/23/2023 9  0 - 40 mg/dL Final   LDL Cholesterol 06/23/2023 107 (H)  0 - 99 mg/dL Final   Comment:        Total Cholesterol/HDL:CHD Risk Coronary Heart Disease Risk Table                     Men   Women  1/2 Average Risk   3.4   3.3  Average Risk       5.0   4.4  2 X Average Risk   9.6   7.1  3 X Average Risk  23.4   11.0        Use the calculated Patient Ratio above and the CHD Risk Table to determine the patient's CHD Risk.        ATP III CLASSIFICATION (LDL):  <100     mg/dL   Optimal  401-027  mg/dL   Near or Above                    Optimal  130-159  mg/dL   Borderline  253-664  mg/dL   High  >403     mg/dL   Very High Performed at [facility], [address], [place], [place] [address]    TSH 06/23/2023 1.229  0.350 - 4.500 uIU/mL Final   Comment: Performed by a 3rd Generation assay with a functional sensitivity of <=0.01 uIU/mL. Performed at [facility], [address], [place], [place] [address]   Admission on 06/20/2023, Discharged on 06/20/2023  Component Date Value Ref Range Status   Sodium 06/20/2023 134 (L)  135 - 145 mmol/L Final   Potassium 06/20/2023 3.9  3.5 - 5.1 mmol/L Final   Chloride 06/20/2023 100  98 - 111 mmol/L Final   CO2 06/20/2023 22  22 - 32 mmol/L Final   Glucose, Bld 06/20/2023 77  70 - 99 mg/dL Final   Glucose  reference range applies only to samples taken after fasting for at least 8 hours.   BUN 06/20/2023 8  6 - 20 mg/dL Final   Creatinine, Ser 06/20/2023 0.85  0.61 - 1.24 mg/dL Final   Calcium 75/64/3329 9.0  8.9 - 10.3 mg/dL Final   GFR, Estimated 06/20/2023 >60  >60 mL/min Final   Comment: (NOTE) Calculated using the CKD-EPI Creatinine Equation (2021)    Anion gap 06/20/2023 12  5 - 15 Final   Performed at [facility], [address], [place], [place] [address]   WBC 06/20/2023 5.0  4.0 - 10.5 K/uL Final   RBC 06/20/2023 5.19  4.22 - 5.81 MIL/uL Final   Hemoglobin 06/20/2023 15.2  13.0 - 17.0 g/dL Final   HCT 16/60/6301 43.9  39.0 - 52.0 % Final   MCV 06/20/2023 84.6  80.0 - 100.0 fL Final   MCH 06/20/2023 29.3  26.0 - 34.0 pg Final   MCHC 06/20/2023 34.6  30.0 - 36.0 g/dL Final   RDW 45/40/9811 14.1  11.5 - 15.5 % Final   Platelets 06/20/2023 239  150 - 400 K/uL Final   nRBC 06/20/2023 0.0  0.0 - 0.2 % Final   Performed at [facility], [address], [place], [place] [address]   Troponin I (High Sensitivity) 06/20/2023 21 (H)  <18 ng/L Final   Comment: (NOTE) Elevated high sensitivity troponin I (hsTnI) values and significant  changes across serial measurements may suggest ACS but many other  chronic and acute conditions are known to elevate hsTnI results.  Refer to the "Links" section for chest pain algorithms and additional  guidance. Performed at [facility], [address], [place], [place] [address]    B Natriuretic Peptide 06/20/2023 54.9  0.0 - 100.0 pg/mL Final   Performed at [facility], [address] [place], [place] [address]   Troponin I (High Sensitivity) 06/20/2023 19 (H)  <18 ng/L Final   Comment: (NOTE) Elevated high sensitivity troponin I (hsTnI) values and significant  changes across serial measurements may suggest ACS but many other  chronic and acute conditions are known to elevate hsTnI results.  Refer to the "Links" section  for chest pain algorithms and additional  guidance. Performed at [facility], [address] [place], [place] [address]   Admission on 06/17/2023, Discharged on 06/18/2023  Component Date Value Ref Range Status   Sodium 06/17/2023 137  135 - 145 mmol/L Final   Potassium 06/17/2023 3.4 (L)  3.5 - 5.1 mmol/L Final   Chloride 06/17/2023 102  98 - 111 mmol/L Final   CO2 06/17/2023 23  22 - 32 mmol/L Final   Glucose, Bld 06/17/2023 84  70 - 99 mg/dL Final   Glucose reference range applies only to samples taken after fasting for at least 8 hours.   BUN 06/17/2023 5 (L)  6 - 20 mg/dL Final   Creatinine, Ser 06/17/2023 1.01  0.61 - 1.24 mg/dL Final   Calcium 96/29/5284 8.9  8.9 - 10.3 mg/dL Final   GFR, Estimated 06/17/2023 >60  >60 mL/min Final   Comment: (NOTE) Calculated using the CKD-EPI Creatinine Equation (2021)    Anion gap 06/17/2023 12  5 - 15 Final   Performed at [facility], [address], [place], [place] [address]   WBC 06/17/2023 5.0  4.0 - 10.5 K/uL Final   RBC 06/17/2023 4.63  4.22 - 5.81 MIL/uL Final   Hemoglobin 06/17/2023 13.4  13.0 - 17.0 g/dL Final   HCT 05/16/7251 39.7  39.0 - 52.0 % Final   MCV 06/17/2023 85.7  80.0 - 100.0 fL Final   MCH 06/17/2023 28.9  26.0 - 34.0 pg Final   MCHC 06/17/2023 33.8  30.0 - 36.0 g/dL Final   RDW 66/44/0347 14.6  11.5 - 15.5 % Final   Platelets 06/17/2023 234  150 - 400 K/uL Final   nRBC 06/17/2023 0.0  0.0 - 0.2 % Final   Performed at [facility], [address], [place], [place] [address]   Troponin I (High Sensitivity) 06/17/2023 36 (H)  <18 ng/L Final   Comment: (NOTE) Elevated high sensitivity troponin I (hsTnI) values and significant  changes across serial measurements may suggest ACS but many other  chronic and acute conditions are known to elevate hsTnI results.  Refer to the "Links" section for chest pain algorithms and additional  guidance. Performed at [facility], [address]  [place], [place] [address]    B Natriuretic Peptide 06/17/2023 185.1 (H)  0.0 - 100.0 pg/mL Final   Performed at [facility], [address] [place], [place] [address]   SARS Coronavirus 2 by RT PCR 06/17/2023 NEGATIVE  NEGATIVE Final   Influenza A by PCR 06/17/2023 NEGATIVE  NEGATIVE Final   Influenza B by PCR 06/17/2023 NEGATIVE  NEGATIVE Final   Comment: (NOTE) The Xpert Xpress SARS-CoV-2/FLU/RSV plus assay is intended as an aid in the diagnosis of influenza from Nasopharyngeal swab specimens and should not be used as a sole basis for treatment. Nasal washings and aspirates are unacceptable for Xpert Xpress SARS-CoV-2/FLU/RSV testing.  Fact Sheet for Patients: BloggerCourse.com  Fact Sheet for Healthcare Providers: SeriousBroker.it  This test is not yet approved or cleared by the [place]  FDA and has been authorized for detection and/or diagnosis of SARS-CoV-2 by FDA under an Emergency Use Authorization (EUA). This EUA will remain in effect (meaning this test can be used) for the duration of the COVID-19 declaration under Section 564(b)(1) of the Act, 21 U.S.C. section 360bbb-3(b)(1), unless the authorization is terminated or revoked.     Resp Syncytial Virus by PCR 06/17/2023 NEGATIVE  NEGATIVE Final   Comment: (NOTE) Fact Sheet for Patients: BloggerCourse.com  Fact Sheet for Healthcare Providers: SeriousBroker.it  This test is not yet approved or cleared by the [place]  FDA and has been authorized for detection and/or diagnosis of SARS-CoV-2 by FDA under an Emergency Use Authorization (EUA). This EUA will remain in effect (meaning this test can be used) for the duration of the COVID-19 declaration under Section 564(b)(1) of the Act, 21 U.S.C. section 360bbb-3(b)(1), unless the authorization is terminated or revoked.  Performed at [facility],  [address] [place] [address], [place], [place] [address]    Troponin I (High Sensitivity) 06/17/2023 37 (H)  <18 ng/L Final   Comment: (NOTE) Elevated high sensitivity troponin I (hsTnI) values and significant  changes across serial measurements may suggest ACS but many other  chronic and acute conditions are known to elevate hsTnI results.  Refer to the "Links" section for chest pain algorithms and additional  guidance. Performed at [facility], [address], [place], [place] [address]     Allergies: Patient has no known allergies.  Medications:  Facility Ordered Medications  Medication   [COMPLETED] thiamine (VITAMIN B1) injection 100 mg   [EXPIRED] LORazepam (ATIVAN) tablet 1 mg   [COMPLETED] LORazepam (ATIVAN) tablet 1 mg   Followed by   [COMPLETED] LORazepam (ATIVAN) tablet 1 mg   Followed by   [COMPLETED] LORazepam (ATIVAN) tablet 1 mg   Followed by   LORazepam (ATIVAN) tablet 1 mg   acetaminophen (TYLENOL) tablet 650 mg   alum & mag hydroxide-simeth (MAALOX/MYLANTA) 200-200-20 MG/5ML suspension 30 mL   magnesium hydroxide (MILK OF MAGNESIA) suspension 30 mL   haloperidol (HALDOL) tablet 5 mg   And   diphenhydrAMINE (BENADRYL) capsule 50 mg   haloperidol lactate (HALDOL) injection 5 mg   And   diphenhydrAMINE (BENADRYL) injection 50 mg   And   LORazepam (ATIVAN) injection 2 mg   haloperidol lactate (HALDOL) injection 10 mg   And   diphenhydrAMINE (BENADRYL) injection 50 mg   And   LORazepam (ATIVAN) injection 2 mg   hydrOXYzine (ATARAX) tablet 25 mg   traZODone (DESYREL) tablet 50 mg   [COMPLETED] cloNIDine (CATAPRES) tablet 0.1 mg   multivitamin with minerals tablet 1 tablet   loperamide (  IMODIUM) capsule 2-4 mg   ondansetron (ZOFRAN-ODT) disintegrating tablet 4 mg   thiamine (VITAMIN B1) tablet 100 mg   [COMPLETED] cloNIDine (CATAPRES) tablet 0.1 mg   losartan (COZAAR) tablet 100 mg   naltrexone (DEPADE) tablet 50 mg   cloNIDine (CATAPRES) tablet 0.1 mg    amLODipine (NORVASC) tablet 5 mg    Long Term Goals: Improvement in symptoms so as ready for discharge  Short Term Goals: Patient will verbalize feelings in meetings with treatment team members., Patient will attend at least of 50% of the groups daily., Patient will participate in completing the [place] Suicide Severity Rating Scale, Patient will score a low risk of violence for 24 hours prior to discharge, and Patient will take medications as prescribed daily.  Medical Decision Making  Alcohol Use Disorder EtOH 172, LFTs WNL -Ativan taper -CIWA with Ativan as needed for CIWA greater than 10  -Last CIWA score is 2 on 4/11 at 6AM -Continue Naltrexone to 50 mg qhs -Thiamine 100 mg IM first day and PO after that -Multivitamin with minerals daily -Tylenol 650 mg every 6 hours as needed for pain -Zofran 4 mg every 6 hours as needed for nausea or vomiting -Imodium 2 to 4 mg as needed for diarrhea or loose stools  -Maalox/Mylanta 30 mL every 4 hours as needed for indigestion -Milk of Mag 30 mL as needed for constipation   Stimulant Use Disorder (cocaine type) -Encourage cessation -PRN Tylenol, maalox/mylanta, milk of magnesia   Medical:  HTN-   -stop losartan  -restart home entresto bid  -restart home lasix 40 mg daily prn  -restart home coreg 12.5 mg bid  -clonidine 0.1 mg tid prn for SBP>150 and DBP>100  Dyslipidemia -restart home lipitor 80 mg daily  Recommendations  Based on my evaluation the patient does not appear to have an emergency medical condition.  Augusta Blizzard, MD 08/25/23  8:24 AM

## 2023-08-25 NOTE — ED Notes (Signed)
 Patient alert & oriented x4. Denies intent to harm self or others when asked. Denies A/VH. Patient reports headache rating 8/10, PRN Tylenol was administered at 0638, provider Augusta Blizzard, MD made aware and is addressing pain concerns. No acute distress noted. Scheduled medications administered with no complications. Support and encouragement provided. Routine safety checks conducted per facility protocol. Encouraged patient to notify staff if any thoughts of harm towards self or others arise. Patient verbalizes understanding and agreement.

## 2023-08-25 NOTE — Group Note (Signed)
 Group Topic: Decisional Balance/Substance Abuse  Group Date: 08/25/2023 Start Time: 1930 End Time: 2000 Facilitators: Renetta Carter, NT  Department: [facility]  Number of Participants: 5  Group Focus: coping skills Treatment Modality:  Skills Training Interventions utilized were group exercise Purpose: express feelings  Name: Joseph Reese Date of Birth: Jul 12, 1965  MR: [id number]    Level of Participation: active Quality of Participation: cooperative Interactions with others: gave feedback Mood/Affect: appropriate Triggers (if applicable):  Cognition: coherent/clear Progress: Moderate Response:  Plan: follow-up needed  Patients Problems:  Patient Active Problem List   Diagnosis Date Noted   Substance use disorder 08/22/2023   Cocaine abuse with cocaine-induced mood disorder (HCC) 06/27/2023   Alcohol use disorder, severe, in controlled environment (HCC) 06/27/2023   MDD (major depressive disorder), recurrent episode, severe (HCC) 06/27/2023   Substance abuse (HCC) 06/23/2023   Acute respiratory failure with hypoxia (HCC) 05/06/2021   Acute pulmonary edema (HCC) 05/06/2021   Hypertensive emergency

## 2023-08-26 DIAGNOSIS — F1590 Other stimulant use, unspecified, uncomplicated: Secondary | ICD-10-CM | POA: Diagnosis not present

## 2023-08-26 DIAGNOSIS — E785 Hyperlipidemia, unspecified: Secondary | ICD-10-CM | POA: Diagnosis not present

## 2023-08-26 DIAGNOSIS — F1490 Cocaine use, unspecified, uncomplicated: Secondary | ICD-10-CM | POA: Diagnosis not present

## 2023-08-26 DIAGNOSIS — F1010 Alcohol abuse, uncomplicated: Secondary | ICD-10-CM | POA: Diagnosis not present

## 2023-08-26 MED ORDER — IBUPROFEN 200 MG PO TABS
200.0000 mg | ORAL_TABLET | Freq: Four times a day (QID) | ORAL | Status: DC | PRN
  Administered 2023-08-26: 200 mg via ORAL
  Filled 2023-08-26: qty 1

## 2023-08-26 MED ORDER — TRAZODONE HCL 50 MG PO TABS
50.0000 mg | ORAL_TABLET | Freq: Once | ORAL | Status: AC
  Administered 2023-08-26: 50 mg via ORAL
  Filled 2023-08-26: qty 1

## 2023-08-26 NOTE — ED Notes (Addendum)
 Pt came up to the nursing station complaining of not being able to sleep to sleep. Trazodone 50mg  administered.

## 2023-08-26 NOTE — Group Note (Signed)
 Group Topic: Relapse and Recovery  Group Date: 08/26/2023 Start Time: 1300 End Time: 1330 Facilitators: Arlan Belling, RN  Department: [facility]  Number of Participants: 7  Group Focus: chemical dependency education and chemical dependency issues Treatment Modality:  Behavior Modification Therapy Interventions utilized were clarification, confrontation, exploration, group exercise, and patient education Purpose: explore maladaptive thinking, express feelings, express irrational fears, improve communication skills, increase insight, regain self-worth, and reinforce self-care   Name: Joseph Reese Date of Birth: 07-29-1965  MR: [id number]    Level of Participation: moderate Quality of Participation: attentive and cooperative Interactions with others: gave feedback Mood/Affect: appropriate Triggers (if applicable):   Cognition: coherent/clear Progress: Moderate Response:   Plan: follow-up needed  Patients Problems:  Patient Active Problem List   Diagnosis Date Noted   Substance use disorder 08/22/2023   Cocaine abuse with cocaine-induced mood disorder (HCC) 06/27/2023   Alcohol use disorder, severe, in controlled environment (HCC) 06/27/2023   MDD (major depressive disorder), recurrent episode, severe (HCC) 06/27/2023   Substance abuse (HCC) 06/23/2023   Acute respiratory failure with hypoxia (HCC) 05/06/2021   Acute pulmonary edema (HCC) 05/06/2021   Hypertensive emergency

## 2023-08-26 NOTE — Group Note (Signed)
 Group Topic: Social Support  Group Date: 08/26/2023 Start Time: 2015 End Time: 2030 Facilitators: Soila Dunnings  Department: [facility]  Number of Participants: 4  Group Focus: acceptance and coping skills Treatment Modality:  Leisure Development Interventions utilized were leisure development Purpose: enhance coping skills and relapse prevention strategies  Name: Joseph Reese Date of Birth: [birth year]  MR: [id number]    Level of Participation: minimal Quality of Participation: attentive Interactions with others: gave feedback Mood/Affect: appropriate Triggers (if applicable): n/a Cognition: coherent/clear Progress: Gaining insight Response: pt listened to others and shared that he did have a plan after d/c  Plan: patient will be encouraged to continue attending groups   Patients Problems:  Patient Active Problem List   Diagnosis Date Noted   Substance use disorder 08/22/2023   Cocaine abuse with cocaine-induced mood disorder (HCC) 06/27/2023   Alcohol use disorder, severe, in controlled environment (HCC) 06/27/2023   MDD (major depressive disorder), recurrent episode, severe (HCC) 06/27/2023   Substance abuse (HCC) 06/23/2023   Acute respiratory failure with hypoxia (HCC) 05/06/2021   Acute pulmonary edema (HCC) 05/06/2021   Hypertensive emergency

## 2023-08-26 NOTE — ED Provider Notes (Addendum)
 Facility Based Crisis Progress Note  Date: 08/26/23 Patient Name: Joseph Reese MRN: [id number]  Diagnoses:  Final diagnoses:  Alcohol use disorder  Stimulant use disorder    Joseph Reese is a 58 year old male with a past psychiatric history of alcohol use disorder and stimulant use disorder cocaine type who presents to the [facility] from [facility] for detox and substance use treatment.  Interval History Patient seen in in the milieu, no acute distress. Patient reports feeling "good" today. Patient reports good sleep and good appetite. Regarding withdrawal symptoms, he denies. He reports having headaches and he states that he takes ibuprofen at home. I discussed starting PRN ibuprofen to aid with HA. Regarding cravings, he denies. He denies adverse effects from naltrexone and he feels that it is helping with alcohol cravings. Patient denies current SI, HI, and AVH. Regarding discharge plans, he continues to want residential facility treatment after detox.   PHQ 2-9:  Flowsheet Row ED from 06/23/2023 in [facility] Most recent reading at 06/27/2023  8:29 AM ED from 06/23/2023 in [facility] Most recent reading at 06/23/2023  4:18 AM Office Visit from 05/11/2021 in [facility] [place] Most recent reading at 05/11/2021  9:36 AM  Thoughts that you would be better off dead, or of hurting yourself in some way Not at all Several days Not at all  PHQ-9 Total Score 0 19 2       Flowsheet Row ED from 08/22/2023 in [facility] Most recent reading at 08/22/2023  4:05 PM ED from 08/22/2023 in [facility] Most recent reading at 08/22/2023  4:00 AM ED from 08/16/2023 in [facility] Emergency Department at [facility] Most recent reading at 08/16/2023  9:51 PM  C-SSRS RISK CATEGORY No Risk No Risk No Risk       Screenings    Flowsheet Row Most  Recent Value  CIWA-Ar Total 0       Total Time spent with patient: 30 minutes  Musculoskeletal  Strength & Muscle Tone: within normal limits Gait & Station: normal Patient leans: N/A  Psychiatric Specialty Exam  Presentation General Appearance:  Appropriate for Environment; Fairly Groomed  Eye Contact: Fair  Speech: Clear and Coherent; Normal Rate  Speech Volume: Normal  Handedness: Right   Mood and Affect  Mood: Euthymic  Affect: Congruent; Appropriate   Thought Process  Thought Processes: Coherent  Descriptions of Associations:Intact  Orientation:Full (Time, Place and Person)  Thought Content:Logical  Diagnosis of Schizophrenia or Schizoaffective disorder in past: No   Hallucinations:Hallucinations: None   Ideas of Reference:None  Suicidal Thoughts:Suicidal Thoughts: No   Homicidal Thoughts:Homicidal Thoughts: No    Sensorium  Memory: Remote Good  Judgment: Fair  Insight: Fair   Art therapist  Concentration: Good  Attention Span: Good  Recall: Good  Fund of Knowledge: Good  Language: Good   Psychomotor Activity  Psychomotor Activity: Psychomotor Activity: Normal    Assets  Assets: Communication Skills; Resilience; Social Support        Physical Exam Vitals reviewed.  Constitutional:      Appearance: Normal appearance.  HENT:     Head: Normocephalic and atraumatic.  Cardiovascular:     Rate and Rhythm: Normal rate.  Pulmonary:     Effort: Pulmonary effort is normal.  Neurological:     General: No focal deficit present.     Mental Status: He  is alert and oriented to person, place, and time.    Review of Systems  Constitutional:  Negative for chills and fever.  Respiratory:  Negative for shortness of breath.   Cardiovascular:  Negative for chest pain and palpitations.  Gastrointestinal:  Negative for nausea and vomiting.  Neurological:  Positive for tremors. Negative for headaches.     Blood pressure (!) 136/90, pulse 83, temperature 98.3 F (36.8 C), temperature source Oral, resp. rate 20, SpO2 100%. There is no height or weight on file to calculate BMI.  Past Psychiatric History:  Dx: AUD, stimulant use d/o Current psychotropic medications: Denies Previous psychotropic medications: Denies Psychiatrist: Denies Therapist: Denies  Past Medical History:  Dx: HTN Medications: per chart review, losartan  Family Psychiatric History: Denies  Social History:  Living: with daughter Job: Roofing Support: mother, daughter, brother, sister Legal History: denies  Substance History:  Smoking: few cigarettes daily Alcohol:   Onset: 35 years ago  Amount and frequency: 1.5 liquor daily  Last use: 4/9  Hx of withdrawal symptoms: states only tremors, denies Dts and seizures  Periods of sobriety: 1995-1998 Illicit drugs: yes, crack cocaine daily since he was in his 20's; uses three times weekly, $80 worth at a time, most recently used 4/9 Rehab: yes multiple times  Is the patient at risk to self? No  Has the patient been a risk to self in the past 6 months? No .    Has the patient been a risk to self within the distant past? No   Is the patient a risk to others? No   Has the patient been a risk to others in the past 6 months? No   Has the patient been a risk to others within the distant past? No   Last Labs:  Admission on 08/22/2023, Discharged on 08/22/2023  Component Date Value Ref Range Status   WBC 08/22/2023 6.1  4.0 - 10.5 K/uL Final   RBC 08/22/2023 5.43  4.22 - 5.81 MIL/uL Final   Hemoglobin 08/22/2023 15.3  13.0 - 17.0 g/dL Final   HCT 40/98/1191 45.3  39.0 - 52.0 % Final   MCV 08/22/2023 83.4  80.0 - 100.0 fL Final   MCH 08/22/2023 28.2  26.0 - 34.0 pg Final   MCHC 08/22/2023 33.8  30.0 - 36.0 g/dL Final   RDW 47/82/9562 14.1  11.5 - 15.5 % Final   Platelets 08/22/2023 293  150 - 400 K/uL Final   nRBC 08/22/2023 0.0  0.0 - 0.2 % Final   Neutrophils  Relative % 08/22/2023 52  % Final   Neutro Abs 08/22/2023 3.1  1.7 - 7.7 K/uL Final   Lymphocytes Relative 08/22/2023 37  % Final   Lymphs Abs 08/22/2023 2.3  0.7 - 4.0 K/uL Final   Monocytes Relative 08/22/2023 8  % Final   Monocytes Absolute 08/22/2023 0.5  0.1 - 1.0 K/uL Final   Eosinophils Relative 08/22/2023 3  % Final   Eosinophils Absolute 08/22/2023 0.2  0.0 - 0.5 K/uL Final   Basophils Relative 08/22/2023 0  % Final   Basophils Absolute 08/22/2023 0.0  0.0 - 0.1 K/uL Final   Immature Granulocytes 08/22/2023 0  % Final   Abs Immature Granulocytes 08/22/2023 0.02  0.00 - 0.07 K/uL Final   Performed at [facility], [address] [place], [place] [address]   Sodium 08/22/2023 142  135 - 145 mmol/L Final   Potassium 08/22/2023 3.9  3.5 - 5.1 mmol/L Final   Chloride 08/22/2023  105  98 - 111 mmol/L Final   CO2 08/22/2023 24  22 - 32 mmol/L Final   Glucose, Bld 08/22/2023 82  70 - 99 mg/dL Final   Glucose reference range applies only to samples taken after fasting for at least 8 hours.   BUN 08/22/2023 8  6 - 20 mg/dL Final   Creatinine, Ser 08/22/2023 0.77  0.61 - 1.24 mg/dL Final   Calcium 81/19/1478 9.6  8.9 - 10.3 mg/dL Final   Total Protein 29/56/2130 7.6  6.5 - 8.1 g/dL Final   Albumin 86/57/8469 4.1  3.5 - 5.0 g/dL Final   AST 62/95/2841 25  15 - 41 U/L Final   ALT 08/22/2023 23  0 - 44 U/L Final   Alkaline Phosphatase 08/22/2023 59  38 - 126 U/L Final   Total Bilirubin 08/22/2023 0.6  0.0 - 1.2 mg/dL Final   GFR, Estimated 08/22/2023 >60  >60 mL/min Final   Comment: (NOTE) Calculated using the CKD-EPI Creatinine Equation (2021)    Anion gap 08/22/2023 13  5 - 15 Final   Performed at [facility], [address], [place], [place] [address]   Alcohol, Ethyl (B) 08/22/2023 172 (H)  <10 mg/dL Final   Comment: (NOTE) Lowest detectable limit for serum alcohol is 10 mg/dL.  For medical purposes only. Performed at [facility], [address],  [place], [place] [address]    TSH 08/22/2023 0.901  0.350 - 4.500 uIU/mL Final   Comment: Performed by a 3rd Generation assay with a functional sensitivity of <=0.01 uIU/mL. Performed at [facility], [address], [place], [place] [address]    POC Amphetamine UR 08/22/2023 None Detected  NONE DETECTED (Cut Off Level 1000 ng/mL) Final   POC Secobarbital (BAR) 08/22/2023 None Detected  NONE DETECTED (Cut Off Level 300 ng/mL) Final   POC Buprenorphine (BUP) 08/22/2023 None Detected  NONE DETECTED (Cut Off Level 10 ng/mL) Final   POC Oxazepam (BZO) 08/22/2023 None Detected  NONE DETECTED (Cut Off Level 300 ng/mL) Final   POC Cocaine UR 08/22/2023 None Detected  NONE DETECTED (Cut Off Level 300 ng/mL) Final   POC Methamphetamine UR 08/22/2023 None Detected  NONE DETECTED (Cut Off Level 1000 ng/mL) Final   POC Morphine 08/22/2023 None Detected  NONE DETECTED (Cut Off Level 300 ng/mL) Final   POC Methadone UR 08/22/2023 None Detected  NONE DETECTED (Cut Off Level 300 ng/mL) Final   POC Oxycodone UR 08/22/2023 None Detected  NONE DETECTED (Cut Off Level 100 ng/mL) Final   POC Marijuana UR 08/22/2023 None Detected  NONE DETECTED (Cut Off Level 50 ng/mL) Final  Admission on 08/16/2023, Discharged on 08/17/2023  Component Date Value Ref Range Status   Sodium 08/16/2023 138  135 - 145 mmol/L Final   Potassium 08/16/2023 3.6  3.5 - 5.1 mmol/L Final   Chloride 08/16/2023 108  98 - 111 mmol/L Final   CO2 08/16/2023 22  22 - 32 mmol/L Final   Glucose, Bld 08/16/2023 82  70 - 99 mg/dL Final   Glucose reference range applies only to samples taken after fasting for at least 8 hours.   BUN 08/16/2023 9  6 - 20 mg/dL Final   Creatinine, Ser 08/16/2023 0.86  0.61 - 1.24 mg/dL Final   Calcium 40/34/7425 9.3  8.9 - 10.3 mg/dL Final   GFR, Estimated 08/16/2023 >60  >60 mL/min Final   Comment: (NOTE) Calculated using the CKD-EPI Creatinine Equation (2021)    Anion gap  08/16/2023 8  5 - 15 Final   Performed  at [facility], [address] [place], [place] [address]   WBC 08/16/2023 5.7  4.0 - 10.5 K/uL Final   RBC 08/16/2023 4.68  4.22 - 5.81 MIL/uL Final   Hemoglobin 08/16/2023 13.1  13.0 - 17.0 g/dL Final   HCT 60/45/4098 38.4 (L)  39.0 - 52.0 % Final   MCV 08/16/2023 82.1  80.0 - 100.0 fL Final   MCH 08/16/2023 28.0  26.0 - 34.0 pg Final   MCHC 08/16/2023 34.1  30.0 - 36.0 g/dL Final   RDW 11/91/4782 14.3  11.5 - 15.5 % Final   Platelets 08/16/2023 276  150 - 400 K/uL Final   nRBC 08/16/2023 0.0  0.0 - 0.2 % Final   Performed at [facility], [address], [place], [place] [address]   Troponin I (High Sensitivity) 08/16/2023 21 (H)  <18 ng/L Final   Comment: (NOTE) Elevated high sensitivity troponin I (hsTnI) values and significant  changes across serial measurements may suggest ACS but many other  chronic and acute conditions are known to elevate hsTnI results.  Refer to the "Links" section for chest pain algorithms and additional  guidance. Performed at [facility], [address], [place], [place] [address]    Troponin I (High Sensitivity) 08/16/2023 20 (H)  <18 ng/L Final   Comment: (NOTE) Elevated high sensitivity troponin I (hsTnI) values and significant  changes across serial measurements may suggest ACS but many other  chronic and acute conditions are known to elevate hsTnI results.  Refer to the "Links" section for chest pain algorithms and additional  guidance. Performed at [facility], [address], [place], [place] [address]   Admission on 06/27/2023, Discharged on 06/29/2023  Component Date Value Ref Range Status   POC Amphetamine UR 06/27/2023 None Detected  NONE DETECTED (Cut Off Level 1000 ng/mL) Final   POC Secobarbital (BAR) 06/27/2023 None Detected  NONE DETECTED (Cut Off Level 300 ng/mL) Final   POC Buprenorphine (BUP) 06/27/2023 None Detected  NONE DETECTED (Cut Off Level 10 ng/mL) Final   POC Oxazepam (BZO) 06/27/2023 None  Detected  NONE DETECTED (Cut Off Level 300 ng/mL) Final   POC Cocaine UR 06/27/2023 None Detected  NONE DETECTED (Cut Off Level 300 ng/mL) Final   POC Methamphetamine UR 06/27/2023 None Detected  NONE DETECTED (Cut Off Level 1000 ng/mL) Final   POC Morphine 06/27/2023 None Detected  NONE DETECTED (Cut Off Level 300 ng/mL) Final   POC Methadone UR 06/27/2023 None Detected  NONE DETECTED (Cut Off Level 300 ng/mL) Final   POC Oxycodone UR 06/27/2023 None Detected  NONE DETECTED (Cut Off Level 100 ng/mL) Final   POC Marijuana UR 06/27/2023 Positive (A)  NONE DETECTED (Cut Off Level 50 ng/mL) Final  Admission on 06/23/2023, Discharged on 06/27/2023  Component Date Value Ref Range Status   WBC 06/23/2023 4.4  4.0 - 10.5 K/uL Final   RBC 06/23/2023 4.93  4.22 - 5.81 MIL/uL Final   Hemoglobin 06/23/2023 14.2  13.0 - 17.0 g/dL Final   HCT 62/95/2841 41.6  39.0 - 52.0 % Final   MCV 06/23/2023 84.4  80.0 - 100.0 fL Final   MCH 06/23/2023 28.8  26.0 - 34.0 pg Final   MCHC 06/23/2023 34.1  30.0 - 36.0 g/dL Final   RDW 32/44/0102 14.2  11.5 - 15.5 % Final   Platelets 06/23/2023 224  150 - 400 K/uL Final  nRBC 06/23/2023 0.0  0.0 - 0.2 % Final   Neutrophils Relative % 06/23/2023 38  % Final   Neutro Abs 06/23/2023 1.7  1.7 - 7.7 K/uL Final   Lymphocytes Relative 06/23/2023 43  % Final   Lymphs Abs 06/23/2023 1.9  0.7 - 4.0 K/uL Final   Monocytes Relative 06/23/2023 12  % Final   Monocytes Absolute 06/23/2023 0.5  0.1 - 1.0 K/uL Final   Eosinophils Relative 06/23/2023 6  % Final   Eosinophils Absolute 06/23/2023 0.3  0.0 - 0.5 K/uL Final   Basophils Relative 06/23/2023 0  % Final   Basophils Absolute 06/23/2023 0.0  0.0 - 0.1 K/uL Final   Immature Granulocytes 06/23/2023 1  % Final   Abs Immature Granulocytes 06/23/2023 0.02  0.00 - 0.07 K/uL Final   Performed at [facility], [address], [place], [place] [address]   Sodium 06/23/2023 139  135 - 145 mmol/L Final   Potassium 06/23/2023 3.7   3.5 - 5.1 mmol/L Final   Chloride 06/23/2023 107  98 - 111 mmol/L Final   CO2 06/23/2023 24  22 - 32 mmol/L Final   Glucose, Bld 06/23/2023 112 (H)  70 - 99 mg/dL Final   Glucose reference range applies only to samples taken after fasting for at least 8 hours.   BUN 06/23/2023 9  6 - 20 mg/dL Final   Creatinine, Ser 06/23/2023 0.88  0.61 - 1.24 mg/dL Final   Calcium 95/62/1308 9.3  8.9 - 10.3 mg/dL Final   Total Protein 65/78/4696 6.8  6.5 - 8.1 g/dL Final   Albumin 29/52/8413 3.6  3.5 - 5.0 g/dL Final   AST 24/40/1027 22  15 - 41 U/L Final   ALT 06/23/2023 16  0 - 44 U/L Final   Alkaline Phosphatase 06/23/2023 54  38 - 126 U/L Final   Total Bilirubin 06/23/2023 0.6  0.0 - 1.2 mg/dL Final   GFR, Estimated 06/23/2023 >60  >60 mL/min Final   Comment: (NOTE) Calculated using the CKD-EPI Creatinine Equation (2021)    Anion gap 06/23/2023 8  5 - 15 Final   Performed at [facility], [address] [place], [place] [address]   Hgb A1c MFr Bld 06/23/2023 5.1  4.8 - 5.6 % Final   Comment: (NOTE) Pre diabetes:          5.7%-6.4%  Diabetes:              >6.4%  Glycemic control for   <7.0% adults with diabetes    Mean Plasma Glucose 06/23/2023 99.67  mg/dL Final   Performed at [facility], [address] [place], [place] [address]   Alcohol, Ethyl (B) 06/23/2023 <10  <10 mg/dL Final   Comment: (NOTE) Lowest detectable limit for serum alcohol is 10 mg/dL.  For medical purposes only. Performed at [facility], [address], [place], [place] [address]    POC Amphetamine UR 06/23/2023 None Detected  NONE DETECTED (Cut Off Level 1000 ng/mL) Final   POC Secobarbital (BAR) 06/23/2023 None Detected  NONE DETECTED (Cut Off Level 300 ng/mL) Final   POC Buprenorphine (BUP) 06/23/2023 None Detected  NONE DETECTED (Cut Off Level 10 ng/mL) Final   POC Oxazepam (BZO) 06/23/2023 None Detected  NONE DETECTED (Cut Off Level 300 ng/mL) Final   POC Cocaine UR 06/23/2023 Positive  (A)  NONE DETECTED (Cut Off Level 300 ng/mL) Final   POC Methamphetamine UR 06/23/2023 None Detected  NONE DETECTED (  Cut Off Level 1000 ng/mL) Final   POC Morphine 06/23/2023 None Detected  NONE DETECTED (Cut Off Level 300 ng/mL) Final   POC Methadone UR 06/23/2023 None Detected  NONE DETECTED (Cut Off Level 300 ng/mL) Final   POC Oxycodone UR 06/23/2023 None Detected  NONE DETECTED (Cut Off Level 100 ng/mL) Final   POC Marijuana UR 06/23/2023 Positive (A)  NONE DETECTED (Cut Off Level 50 ng/mL) Final   Cholesterol 06/23/2023 192  0 - 200 mg/dL Final   Triglycerides 60/45/4098 46  <150 mg/dL Final   HDL 11/91/4782 76  >40 mg/dL Final   Total CHOL/HDL Ratio 06/23/2023 2.5  RATIO Final   VLDL 06/23/2023 9  0 - 40 mg/dL Final   LDL Cholesterol 06/23/2023 107 (H)  0 - 99 mg/dL Final   Comment:        Total Cholesterol/HDL:CHD Risk Coronary Heart Disease Risk Table                     Men   Women  1/2 Average Risk   3.4   3.3  Average Risk       5.0   4.4  2 X Average Risk   9.6   7.1  3 X Average Risk  23.4   11.0        Use the calculated Patient Ratio above and the CHD Risk Table to determine the patient's CHD Risk.        ATP III CLASSIFICATION (LDL):  <100     mg/dL   Optimal  956-213  mg/dL   Near or Above                    Optimal  130-159  mg/dL   Borderline  086-578  mg/dL   High  >469     mg/dL   Very High Performed at [facility], [address] [place], [place] [address]    TSH 06/23/2023 1.229  0.350 - 4.500 uIU/mL Final   Comment: Performed by a 3rd Generation assay with a functional sensitivity of <=0.01 uIU/mL. Performed at [facility], [address], [place], [place] [address]   Admission on 06/20/2023, Discharged on 06/20/2023  Component Date Value Ref Range Status   Sodium 06/20/2023 134 (L)  135 - 145 mmol/L Final   Potassium 06/20/2023 3.9  3.5 - 5.1 mmol/L Final   Chloride 06/20/2023 100  98 - 111 mmol/L Final   CO2 06/20/2023 22  22 - 32  mmol/L Final   Glucose, Bld 06/20/2023 77  70 - 99 mg/dL Final   Glucose reference range applies only to samples taken after fasting for at least 8 hours.   BUN 06/20/2023 8  6 - 20 mg/dL Final   Creatinine, Ser 06/20/2023 0.85  0.61 - 1.24 mg/dL Final   Calcium 44/05/270 9.0  8.9 - 10.3 mg/dL Final   GFR, Estimated 06/20/2023 >60  >60 mL/min Final   Comment: (NOTE) Calculated using the CKD-EPI Creatinine Equation (2021)    Anion gap 06/20/2023 12  5 - 15 Final   Performed at [facility], [address], [place], [place] [address]   WBC 06/20/2023 5.0  4.0 - 10.5 K/uL Final   RBC 06/20/2023 5.19  4.22 - 5.81 MIL/uL Final   Hemoglobin 06/20/2023 15.2  13.0 - 17.0 g/dL Final   HCT 40/34/7425 43.9  39.0 - 52.0 % Final   MCV 06/20/2023 84.6  80.0 - 100.0  fL Final   MCH 06/20/2023 29.3  26.0 - 34.0 pg Final   MCHC 06/20/2023 34.6  30.0 - 36.0 g/dL Final   RDW 16/02/9603 14.1  11.5 - 15.5 % Final   Platelets 06/20/2023 239  150 - 400 K/uL Final   nRBC 06/20/2023 0.0  0.0 - 0.2 % Final   Performed at [facility], [address], [place], [place] [address]   Troponin I (High Sensitivity) 06/20/2023 21 (H)  <18 ng/L Final   Comment: (NOTE) Elevated high sensitivity troponin I (hsTnI) values and significant  changes across serial measurements may suggest ACS but many other  chronic and acute conditions are known to elevate hsTnI results.  Refer to the "Links" section for chest pain algorithms and additional  guidance. Performed at [facility], [address], [place], [place] [address]    B Natriuretic Peptide 06/20/2023 54.9  0.0 - 100.0 pg/mL Final   Performed at [facility], [address], [place], [place] [address]   Troponin I (High Sensitivity) 06/20/2023 19 (H)  <18 ng/L Final   Comment: (NOTE) Elevated high sensitivity troponin I (hsTnI) values and significant  changes across serial measurements may suggest ACS but many other  chronic and acute  conditions are known to elevate hsTnI results.  Refer to the "Links" section for chest pain algorithms and additional  guidance. Performed at [facility], [address], [place], [place] [address]   Admission on 06/17/2023, Discharged on 06/18/2023  Component Date Value Ref Range Status   Sodium 06/17/2023 137  135 - 145 mmol/L Final   Potassium 06/17/2023 3.4 (L)  3.5 - 5.1 mmol/L Final   Chloride 06/17/2023 102  98 - 111 mmol/L Final   CO2 06/17/2023 23  22 - 32 mmol/L Final   Glucose, Bld 06/17/2023 84  70 - 99 mg/dL Final   Glucose reference range applies only to samples taken after fasting for at least 8 hours.   BUN 06/17/2023 5 (L)  6 - 20 mg/dL Final   Creatinine, Ser 06/17/2023 1.01  0.61 - 1.24 mg/dL Final   Calcium 86/57/8469 8.9  8.9 - 10.3 mg/dL Final   GFR, Estimated 06/17/2023 >60  >60 mL/min Final   Comment: (NOTE) Calculated using the CKD-EPI Creatinine Equation (2021)    Anion gap 06/17/2023 12  5 - 15 Final   Performed at [facility], [address], [place], [place] [address]   WBC 06/17/2023 5.0  4.0 - 10.5 K/uL Final   RBC 06/17/2023 4.63  4.22 - 5.81 MIL/uL Final   Hemoglobin 06/17/2023 13.4  13.0 - 17.0 g/dL Final   HCT 84/13/2440 39.7  39.0 - 52.0 % Final   MCV 06/17/2023 85.7  80.0 - 100.0 fL Final   MCH 06/17/2023 28.9  26.0 - 34.0 pg Final   MCHC 06/17/2023 33.8  30.0 - 36.0 g/dL Final   RDW 03/10/2535 14.6  11.5 - 15.5 % Final   Platelets 06/17/2023 234  150 - 400 K/uL Final   nRBC 06/17/2023 0.0  0.0 - 0.2 % Final   Performed at [facility], [address] [place], [place] [address]   Troponin I (High Sensitivity) 06/17/2023 36 (H)  <18 ng/L Final   Comment: (NOTE) Elevated high sensitivity troponin I (hsTnI) values and significant  changes across serial measurements may suggest ACS but many other  chronic and acute conditions are known to elevate hsTnI results.  Refer to  the "Links" section for chest pain algorithms and  additional  guidance. Performed at [facility], [address] [place], [place] [address]    B Natriuretic Peptide 06/17/2023 185.1 (H)  0.0 - 100.0 pg/mL Final   Performed at [facility], [address] [place], [place] [address]   SARS Coronavirus 2 by RT PCR 06/17/2023 NEGATIVE  NEGATIVE Final   Influenza A by PCR 06/17/2023 NEGATIVE  NEGATIVE Final   Influenza B by PCR 06/17/2023 NEGATIVE  NEGATIVE Final   Comment: (NOTE) The Xpert Xpress SARS-CoV-2/FLU/RSV plus assay is intended as an aid in the diagnosis of influenza from Nasopharyngeal swab specimens and should not be used as a sole basis for treatment. Nasal washings and aspirates are unacceptable for Xpert Xpress SARS-CoV-2/FLU/RSV testing.  Fact Sheet for Patients: BloggerCourse.com  Fact Sheet for Healthcare Providers: SeriousBroker.it  This test is not yet approved or cleared by the [place]  FDA and has been authorized for detection and/or diagnosis of SARS-CoV-2 by FDA under an Emergency Use Authorization (EUA). This EUA will remain in effect (meaning this test can be used) for the duration of the COVID-19 declaration under Section 564(b)(1) of the Act, 21 U.S.C. section 360bbb-3(b)(1), unless the authorization is terminated or revoked.     Resp Syncytial Virus by PCR 06/17/2023 NEGATIVE  NEGATIVE Final   Comment: (NOTE) Fact Sheet for Patients: BloggerCourse.com  Fact Sheet for Healthcare Providers: SeriousBroker.it  This test is not yet approved or cleared by the [place]  FDA and has been authorized for detection and/or diagnosis of SARS-CoV-2 by FDA under an Emergency Use Authorization (EUA). This EUA will remain in effect (meaning this test can be used) for the duration of the COVID-19 declaration under Section 564(b)(1) of the Act, 21 U.S.C. section 360bbb-3(b)(1), unless the  authorization is terminated or revoked.  Performed at [facility], [address], [place], [place] [address]    Troponin I (High Sensitivity) 06/17/2023 37 (H)  <18 ng/L Final   Comment: (NOTE) Elevated high sensitivity troponin I (hsTnI) values and significant  changes across serial measurements may suggest ACS but many other  chronic and acute conditions are known to elevate hsTnI results.  Refer to the "Links" section for chest pain algorithms and additional  guidance. Performed at [facility], [address], [place], [place] [address]     Allergies: Patient has no known allergies.  Medications:  Facility Ordered Medications  Medication   [COMPLETED] thiamine (VITAMIN B1) injection 100 mg   [EXPIRED] LORazepam (ATIVAN) tablet 1 mg   [COMPLETED] LORazepam (ATIVAN) tablet 1 mg   Followed by   [COMPLETED] LORazepam (ATIVAN) tablet 1 mg   Followed by   [COMPLETED] LORazepam (ATIVAN) tablet 1 mg   Followed by   [COMPLETED] LORazepam (ATIVAN) tablet 1 mg   acetaminophen (TYLENOL) tablet 650 mg   alum & mag hydroxide-simeth (MAALOX/MYLANTA) 200-200-20 MG/5ML suspension 30 mL   magnesium hydroxide (MILK OF MAGNESIA) suspension 30 mL   haloperidol (HALDOL) tablet 5 mg   And   diphenhydrAMINE (BENADRYL) capsule 50 mg   haloperidol lactate (HALDOL) injection 5 mg   And   diphenhydrAMINE (BENADRYL) injection 50 mg   And   LORazepam (ATIVAN) injection 2 mg   haloperidol lactate (HALDOL) injection 10 mg   And   diphenhydrAMINE (BENADRYL) injection 50 mg   And   LORazepam (ATIVAN) injection 2 mg   hydrOXYzine (ATARAX) tablet 25 mg   traZODone (DESYREL) tablet 50  mg   [COMPLETED] cloNIDine (CATAPRES) tablet 0.1 mg   multivitamin with minerals tablet 1 tablet   [EXPIRED] loperamide (IMODIUM) capsule 2-4 mg   [EXPIRED] ondansetron (ZOFRAN-ODT) disintegrating tablet 4 mg   thiamine (VITAMIN B1) tablet 100 mg   [COMPLETED] cloNIDine (CATAPRES) tablet 0.1 mg    naltrexone (DEPADE) tablet 50 mg   cloNIDine (CATAPRES) tablet 0.1 mg   carvedilol (COREG) tablet 12.5 mg   sacubitril-valsartan (ENTRESTO) 24-26 mg per tablet   atorvastatin (LIPITOR) tablet 80 mg   furosemide (LASIX) tablet 40 mg   [COMPLETED] traZODone (DESYREL) tablet 50 mg   ibuprofen (ADVIL) tablet 200 mg    Long Term Goals: Improvement in symptoms so as ready for discharge  Short Term Goals: Patient will verbalize feelings in meetings with treatment team members., Patient will attend at least of 50% of the groups daily., Patient will participate in completing the [place] Suicide Severity Rating Scale, Patient will score a low risk of violence for 24 hours prior to discharge, and Patient will take medications as prescribed daily.  Medical Decision Making  Alcohol Use Disorder EtOH 172, LFTs WNL -Ativan taper completed in 4/13 --D/c CIWA as scores have been <4 for >24 hours  -Continue Naltrexone to 50 mg qhs -Thiamine 100 mg IM first day and PO after that -Multivitamin with minerals daily -Tylenol 650 mg every 6 hours as needed for pain -Zofran 4 mg every 6 hours as needed for nausea or vomiting -Imodium 2 to 4 mg as needed for diarrhea or loose stools  -Maalox/Mylanta 30 mL every 4 hours as needed for indigestion -Milk of Mag 30 mL as needed for constipation   Stimulant Use Disorder (cocaine type) -Encourage cessation -PRN Tylenol, maalox/mylanta, milk of magnesia, ibuprofen   Medical:  HTN-   -stop losartan  -restart home entresto bid  -restart home lasix 40 mg daily prn  -restart home coreg 12.5 mg bid  -clonidine 0.1 mg tid prn for SBP>150 and DBP>100  Dyslipidemia -restart home lipitor 80 mg daily  Dispo: Residential pending   Joice Nares, MD 08/26/23  10:06 AM

## 2023-08-26 NOTE — ED Notes (Signed)
 Patient is awake and alert on unit.  He is social with select peers and is calm and pleasant.  No distress or complaint.  No withdrawal at this time.  He makes needs known to staff.  Will monitor.

## 2023-08-26 NOTE — ED Notes (Signed)
 BP is 136/90. Shalon NP Notified

## 2023-08-26 NOTE — Care Management (Signed)
 [facility] Care Management   Writer referred patient to the [facility]

## 2023-08-26 NOTE — Group Note (Signed)
 Group Topic: Change and Accountability AA Group meeting Group Date: 08/26/2023 Start Time: 1015 End Time: 1055 Facilitators: Brantley Caldwell B  Department: [facility]  Number of Participants: 6  Group Focus: daily focus and self-awareness Treatment Modality:  Psychoeducation Interventions utilized were patient education and problem solving Purpose: relapse prevention strategies  Name: Joseph Reese Date of Birth: [birth year]  MR: [id number]    Level of Participation: minimal Quality of Participation: attentive and cooperative Interactions with others: pt did not want to express feeling at this time Mood/Affect: positive Triggers (if applicable): N/A Cognition: coherent/clear Progress: Moderate Response: N/A Plan: follow-up needed  Patients Problems:  Patient Active Problem List   Diagnosis Date Noted   Substance use disorder 08/22/2023   Cocaine abuse with cocaine-induced mood disorder (HCC) 06/27/2023   Alcohol use disorder, severe, in controlled environment (HCC) 06/27/2023   MDD (major depressive disorder), recurrent episode, severe (HCC) 06/27/2023   Substance abuse (HCC) 06/23/2023   Acute respiratory failure with hypoxia (HCC) 05/06/2021   Acute pulmonary edema (HCC) 05/06/2021   Hypertensive emergency

## 2023-08-26 NOTE — ED Notes (Signed)
 Pt is in the dayroom watching TV with peers. Pt denies SI/HI/AVH. Pt has no further complain.No acute distress noted. Will continue to monitor for safety and provide support.

## 2023-08-26 NOTE — ED Notes (Signed)
 Pt is in the bed resting. Respirations are even and unlabored. No acute distress noted. Will continue to monitor for safety

## 2023-08-26 NOTE — ED Notes (Signed)
 Pt is on bed awake. Respirations are even and unlabored. No acute distress noted. Will continue to monitor for safety

## 2023-08-27 DIAGNOSIS — F1590 Other stimulant use, unspecified, uncomplicated: Secondary | ICD-10-CM | POA: Diagnosis not present

## 2023-08-27 DIAGNOSIS — F1490 Cocaine use, unspecified, uncomplicated: Secondary | ICD-10-CM | POA: Diagnosis not present

## 2023-08-27 DIAGNOSIS — F1010 Alcohol abuse, uncomplicated: Secondary | ICD-10-CM | POA: Diagnosis not present

## 2023-08-27 DIAGNOSIS — E785 Hyperlipidemia, unspecified: Secondary | ICD-10-CM | POA: Diagnosis not present

## 2023-08-27 LAB — BASIC METABOLIC PANEL WITH GFR
Anion gap: 7 (ref 5–15)
BUN: 7 mg/dL (ref 6–20)
CO2: 24 mmol/L (ref 22–32)
Calcium: 9.4 mg/dL (ref 8.9–10.3)
Chloride: 108 mmol/L (ref 98–111)
Creatinine, Ser: 0.9 mg/dL (ref 0.61–1.24)
GFR, Estimated: >60 mL/min (ref >60)
Glucose, Bld: 107 mg/dL — ABNORMAL HIGH (ref 70–99)
Potassium: 3.7 mmol/L (ref 3.5–5.1)
Sodium: 139 mmol/L (ref 135–145)

## 2023-08-27 MED ORDER — MELATONIN 5 MG PO TABS
5.0000 mg | ORAL_TABLET | Freq: Every day | ORAL | Status: DC
  Administered 2023-08-27 – 2023-08-28 (×2): 5 mg via ORAL
  Filled 2023-08-27: qty 1
  Filled 2023-08-27: qty 14
  Filled 2023-08-27: qty 1

## 2023-08-27 MED ORDER — FLUTICASONE PROPIONATE 50 MCG/ACT NA SUSP
2.0000 | Freq: Once | NASAL | Status: AC
  Administered 2023-08-27: 2 via NASAL
  Filled 2023-08-27: qty 16

## 2023-08-27 MED ORDER — TRAZODONE HCL 100 MG PO TABS
100.0000 mg | ORAL_TABLET | Freq: Every evening | ORAL | Status: DC | PRN
  Administered 2023-08-27 – 2023-08-28 (×2): 100 mg via ORAL
  Filled 2023-08-27 (×2): qty 1
  Filled 2023-08-27: qty 14

## 2023-08-27 MED ORDER — NALTREXONE HCL 50 MG PO TABS
50.0000 mg | ORAL_TABLET | Freq: Every day | ORAL | Status: DC
  Administered 2023-08-27 – 2023-08-28 (×2): 50 mg via ORAL
  Filled 2023-08-27: qty 1
  Filled 2023-08-27: qty 14
  Filled 2023-08-27: qty 1

## 2023-08-27 NOTE — ED Notes (Signed)
 Alert and oriented x 4. Calm and withdrawn.  Medication compliant. Denied current SI plan and intent.  Denied HI and AV hallucinations.  \ Q 15 minute observations in place for safety

## 2023-08-27 NOTE — Group Note (Signed)
 Group Topic: Social Support  Group Date: 08/27/2023 Start Time: 2000 End Time: 2030 Facilitators: Wendall Halls B  Department: [facility]  Number of Participants: 1  Group Focus: check in Treatment Modality:  Leisure Development Interventions utilized were leisure development Purpose: express feelings, improve communication skills, increase insight, and regain self-worth  Name: Joseph Reese Date of Birth: 10-06-1965  MR: [id number]    Level of Participation: active Quality of Participation: attentive and cooperative Interactions with others: gave feedback Mood/Affect: appropriate Triggers (if applicable): NA Cognition: coherent/clear Progress: Gaining insight Response: NA Plan: patient will be encouraged to keep going to groups.   Patients Problems:  Patient Active Problem List   Diagnosis Date Noted   Substance use disorder 08/22/2023   Cocaine abuse with cocaine-induced mood disorder (HCC) 06/27/2023   Alcohol use disorder, severe, in controlled environment (HCC) 06/27/2023   MDD (major depressive disorder), recurrent episode, severe (HCC) 06/27/2023   Substance abuse (HCC) 06/23/2023   Acute respiratory failure with hypoxia (HCC) 05/06/2021   Acute pulmonary edema (HCC) 05/06/2021   Hypertensive emergency

## 2023-08-27 NOTE — ED Notes (Signed)
 Pt on bed. Not asleep.

## 2023-08-27 NOTE — Group Note (Signed)
 Group Topic: Fears and Unhealthy Coping Skills  Group Date: 08/27/2023 Start Time: 1200 End Time: 1215 Facilitators: Arlan Belling, RN  Department: [facility]  Number of Participants: 8  Group Focus: check in Treatment Modality:  Behavior Modification Therapy Interventions utilized were exploration, group exercise, problem solving, and support Purpose: enhance coping skills, explore maladaptive thinking, express feelings, express irrational fears, improve communication skills, increase insight, regain self-worth, and reinforce self-care  Name: Joseph Reese Date of Birth: [birth year]  MR: [id number]    Level of Participation: moderate Quality of Participation: attentive and cooperative Interactions with others: gave feedback Mood/Affect: appropriate Triggers (if applicable):   Cognition: coherent/clear and goal directed Progress: Gaining insight Response:   Plan: follow-up needed  Patients Problems:  Patient Active Problem List   Diagnosis Date Noted   Substance use disorder 08/22/2023   Cocaine abuse with cocaine-induced mood disorder (HCC) 06/27/2023   Alcohol use disorder, severe, in controlled environment (HCC) 06/27/2023   MDD (major depressive disorder), recurrent episode, severe (HCC) 06/27/2023   Substance abuse (HCC) 06/23/2023   Acute respiratory failure with hypoxia (HCC) 05/06/2021   Acute pulmonary edema (HCC) 05/06/2021   Hypertensive emergency

## 2023-08-27 NOTE — ED Notes (Signed)
 Patient complained of cold symptoms and flonase ordered and given. Afebrile.  MD aware

## 2023-08-27 NOTE — ED Notes (Signed)
 Pt is in the dayroom watching TV with peers. Pt complained of not sleeping well for the last 2 days. Pt denies SI/HI/AVH. Pt has no further complain.No acute distress noted. Will continue to monitor for safety and provide support.

## 2023-08-27 NOTE — ED Provider Notes (Signed)
 Facility Based Crisis Progress Note  Date: 08/27/23 Patient Name: Joseph Reese MRN: [id number]  Diagnoses:  Final diagnoses:  Alcohol use disorder  Stimulant use disorder    Joseph Reese is a 58 year old male with a past psychiatric history of alcohol use disorder and stimulant use disorder cocaine type who presents to the [facility] from [facility] for detox and substance use treatment.  Interval History Patient seen in the milieu, no acute distress. Patient reports feeling "good" today. Patient reports poor sleep and reports having difficulty falling and staying asleep. I discussed that we will adjust his medication to aid with his sleep. He reports good appetite. Regarding withdrawal symptoms, he denies. Regarding cravings, he denies. He reports no adverse effects from his medications. Patient denies current SI, HI, and AVH. Regarding discharge plans, he continues to want to go to residential.    PHQ 2-9:  Flowsheet Row ED from 06/23/2023 in [facility] Most recent reading at 06/27/2023  8:29 AM ED from 06/23/2023 in [facility] Most recent reading at 06/23/2023  4:18 AM Office Visit from 05/11/2021 in [facility] [place] Most recent reading at 05/11/2021  9:36 AM  Thoughts that you would be better off dead, or of hurting yourself in some way Not at all Several days Not at all  PHQ-9 Total Score 0 19 2       Flowsheet Row ED from 08/22/2023 in [facility] Most recent reading at 08/22/2023  4:05 PM ED from 08/22/2023 in [facility] Most recent reading at 08/22/2023  4:00 AM ED from 08/16/2023 in [facility] Emergency Department at [facility] Most recent reading at 08/16/2023  9:51 PM  C-SSRS RISK CATEGORY No Risk No Risk No Risk       Screenings    Flowsheet Row Most Recent Value  CIWA-Ar Total 0       Total Time spent with  patient: 30 minutes  Musculoskeletal  Strength & Muscle Tone: within normal limits Gait & Station: normal Patient leans: N/A  Psychiatric Specialty Exam  Presentation General Appearance:  Appropriate for Environment; Fairly Groomed  Eye Contact: Fair  Speech: Clear and Coherent; Normal Rate  Speech Volume: Normal  Handedness: Right   Mood and Affect  Mood: Euthymic  Affect: Congruent; Appropriate   Thought Process  Thought Processes: Coherent  Descriptions of Associations:Intact  Orientation:Full (Time, Place and Person)  Thought Content:Logical  Diagnosis of Schizophrenia or Schizoaffective disorder in past: No   Hallucinations:Hallucinations: None   Ideas of Reference:None  Suicidal Thoughts:Suicidal Thoughts: No   Homicidal Thoughts:Homicidal Thoughts: No    Sensorium  Memory: Remote Good  Judgment: Fair  Insight: Fair   Art therapist  Concentration: Good  Attention Span: Good  Recall: Good  Fund of Knowledge: Good  Language: Good   Psychomotor Activity  Psychomotor Activity: Psychomotor Activity: Normal    Assets  Assets: Communication Skills; Resilience; Social Support        Physical Exam Vitals reviewed.  Constitutional:      Appearance: Normal appearance.  HENT:     Head: Normocephalic and atraumatic.  Cardiovascular:     Rate and Rhythm: Normal rate.  Pulmonary:     Effort: Pulmonary effort is normal.  Neurological:     General: No focal deficit present.     Mental Status: He is alert and oriented to person, place, and time.  Review of Systems  Constitutional:  Negative for chills and fever.  Respiratory:  Negative for shortness of breath.   Cardiovascular:  Negative for chest pain and palpitations.  Gastrointestinal:  Negative for nausea and vomiting.  Neurological:  Negative for tremors and headaches.    Blood pressure (!) 156/93, pulse 67, temperature 98.4 F (36.9 C),  temperature source Oral, resp. rate 19, SpO2 99%. There is no height or weight on file to calculate BMI.  Past Psychiatric History:  Dx: AUD, stimulant use d/o Current psychotropic medications: Denies Previous psychotropic medications: Denies Psychiatrist: Denies Therapist: Denies  Past Medical History:  Dx: HTN Medications: per chart review, losartan  Family Psychiatric History: Denies  Social History:  Living: with daughter Job: Roofing Support: mother, daughter, brother, sister Legal History: denies  Substance History:  Smoking: few cigarettes daily Alcohol:   Onset: 35 years ago  Amount and frequency: 1.5 liquor daily  Last use: 4/9  Hx of withdrawal symptoms: states only tremors, denies Dts and seizures  Periods of sobriety: 1995-1998 Illicit drugs: yes, crack cocaine daily since he was in his 20's; uses three times weekly, $80 worth at a time, most recently used 4/9 Rehab: yes multiple times  Is the patient at risk to self? No  Has the patient been a risk to self in the past 6 months? No .    Has the patient been a risk to self within the distant past? No   Is the patient a risk to others? No   Has the patient been a risk to others in the past 6 months? No   Has the patient been a risk to others within the distant past? No   Last Labs:  Admission on 08/22/2023  Component Date Value Ref Range Status   Sodium 08/27/2023 139  135 - 145 mmol/L Final   Potassium 08/27/2023 3.7  3.5 - 5.1 mmol/L Final   Chloride 08/27/2023 108  98 - 111 mmol/L Final   CO2 08/27/2023 24  22 - 32 mmol/L Final   Glucose, Bld 08/27/2023 107 (H)  70 - 99 mg/dL Final   Glucose reference range applies only to samples taken after fasting for at least 8 hours.   BUN 08/27/2023 7  6 - 20 mg/dL Final   Creatinine, Ser 08/27/2023 0.90  0.61 - 1.24 mg/dL Final   Calcium 40/98/1191 9.4  8.9 - 10.3 mg/dL Final   GFR, Estimated 08/27/2023 >60  >60 mL/min Final   Comment: (NOTE) Calculated using  the CKD-EPI Creatinine Equation (2021)    Anion gap 08/27/2023 7  5 - 15 Final   Performed at [facility], [address] [place], [place] [address]  Admission on 08/22/2023, Discharged on 08/22/2023  Component Date Value Ref Range Status   WBC 08/22/2023 6.1  4.0 - 10.5 K/uL Final   RBC 08/22/2023 5.43  4.22 - 5.81 MIL/uL Final   Hemoglobin 08/22/2023 15.3  13.0 - 17.0 g/dL Final   HCT 56/21/3086 45.3  39.0 - 52.0 % Final   MCV 08/22/2023 83.4  80.0 - 100.0 fL Final   MCH 08/22/2023 28.2  26.0 - 34.0 pg Final   MCHC 08/22/2023 33.8  30.0 - 36.0 g/dL Final   RDW 57/84/6962 14.1  11.5 - 15.5 % Final   Platelets 08/22/2023 293  150 - 400 K/uL Final   nRBC 08/22/2023 0.0  0.0 - 0.2 % Final   Neutrophils Relative % 08/22/2023 52  % Final   Neutro Abs 08/22/2023  3.1  1.7 - 7.7 K/uL Final   Lymphocytes Relative 08/22/2023 37  % Final   Lymphs Abs 08/22/2023 2.3  0.7 - 4.0 K/uL Final   Monocytes Relative 08/22/2023 8  % Final   Monocytes Absolute 08/22/2023 0.5  0.1 - 1.0 K/uL Final   Eosinophils Relative 08/22/2023 3  % Final   Eosinophils Absolute 08/22/2023 0.2  0.0 - 0.5 K/uL Final   Basophils Relative 08/22/2023 0  % Final   Basophils Absolute 08/22/2023 0.0  0.0 - 0.1 K/uL Final   Immature Granulocytes 08/22/2023 0  % Final   Abs Immature Granulocytes 08/22/2023 0.02  0.00 - 0.07 K/uL Final   Performed at [facility], [address], [place], [place] [address]   Sodium 08/22/2023 142  135 - 145 mmol/L Final   Potassium 08/22/2023 3.9  3.5 - 5.1 mmol/L Final   Chloride 08/22/2023 105  98 - 111 mmol/L Final   CO2 08/22/2023 24  22 - 32 mmol/L Final   Glucose, Bld 08/22/2023 82  70 - 99 mg/dL Final   Glucose reference range applies only to samples taken after fasting for at least 8 hours.   BUN 08/22/2023 8  6 - 20 mg/dL Final   Creatinine, Ser 08/22/2023 0.77  0.61 - 1.24 mg/dL Final   Calcium 19/14/7829 9.6  8.9 - 10.3 mg/dL Final   Total Protein 56/21/3086 7.6  6.5  - 8.1 g/dL Final   Albumin 57/84/6962 4.1  3.5 - 5.0 g/dL Final   AST 95/28/4132 25  15 - 41 U/L Final   ALT 08/22/2023 23  0 - 44 U/L Final   Alkaline Phosphatase 08/22/2023 59  38 - 126 U/L Final   Total Bilirubin 08/22/2023 0.6  0.0 - 1.2 mg/dL Final   GFR, Estimated 08/22/2023 >60  >60 mL/min Final   Comment: (NOTE) Calculated using the CKD-EPI Creatinine Equation (2021)    Anion gap 08/22/2023 13  5 - 15 Final   Performed at [facility], [address], [place], [place] [address]   Alcohol, Ethyl (B) 08/22/2023 172 (H)  <10 mg/dL Final   Comment: (NOTE) Lowest detectable limit for serum alcohol is 10 mg/dL.  For medical purposes only. Performed at [facility], [address], [place], [place] [address]    TSH 08/22/2023 0.901  0.350 - 4.500 uIU/mL Final   Comment: Performed by a 3rd Generation assay with a functional sensitivity of <=0.01 uIU/mL. Performed at [facility], [address], [place], [place] [address]    POC Amphetamine UR 08/22/2023 None Detected  NONE DETECTED (Cut Off Level 1000 ng/mL) Final   POC Secobarbital (BAR) 08/22/2023 None Detected  NONE DETECTED (Cut Off Level 300 ng/mL) Final   POC Buprenorphine (BUP) 08/22/2023 None Detected  NONE DETECTED (Cut Off Level 10 ng/mL) Final   POC Oxazepam (BZO) 08/22/2023 None Detected  NONE DETECTED (Cut Off Level 300 ng/mL) Final   POC Cocaine UR 08/22/2023 None Detected  NONE DETECTED (Cut Off Level 300 ng/mL) Final   POC Methamphetamine UR 08/22/2023 None Detected  NONE DETECTED (Cut Off Level 1000 ng/mL) Final   POC Morphine 08/22/2023 None Detected  NONE DETECTED (Cut Off Level 300 ng/mL) Final   POC Methadone UR 08/22/2023 None Detected  NONE DETECTED (Cut Off Level 300 ng/mL) Final   POC Oxycodone UR 08/22/2023 None Detected  NONE DETECTED (Cut Off Level 100 ng/mL) Final   POC Marijuana UR 08/22/2023 None Detected  NONE  DETECTED (Cut Off Level 50 ng/mL) Final  Admission on 08/16/2023,  Discharged on 08/17/2023  Component Date Value Ref Range Status   Sodium 08/16/2023 138  135 - 145 mmol/L Final   Potassium 08/16/2023 3.6  3.5 - 5.1 mmol/L Final   Chloride 08/16/2023 108  98 - 111 mmol/L Final   CO2 08/16/2023 22  22 - 32 mmol/L Final   Glucose, Bld 08/16/2023 82  70 - 99 mg/dL Final   Glucose reference range applies only to samples taken after fasting for at least 8 hours.   BUN 08/16/2023 9  6 - 20 mg/dL Final   Creatinine, Ser 08/16/2023 0.86  0.61 - 1.24 mg/dL Final   Calcium 29/56/2130 9.3  8.9 - 10.3 mg/dL Final   GFR, Estimated 08/16/2023 >60  >60 mL/min Final   Comment: (NOTE) Calculated using the CKD-EPI Creatinine Equation (2021)    Anion gap 08/16/2023 8  5 - 15 Final   Performed at [facility], [address] [place], [place] [address]   WBC 08/16/2023 5.7  4.0 - 10.5 K/uL Final   RBC 08/16/2023 4.68  4.22 - 5.81 MIL/uL Final   Hemoglobin 08/16/2023 13.1  13.0 - 17.0 g/dL Final   HCT 46/96/2952 38.4 (L)  39.0 - 52.0 % Final   MCV 08/16/2023 82.1  80.0 - 100.0 fL Final   MCH 08/16/2023 28.0  26.0 - 34.0 pg Final   MCHC 08/16/2023 34.1  30.0 - 36.0 g/dL Final   RDW 84/13/2440 14.3  11.5 - 15.5 % Final   Platelets 08/16/2023 276  150 - 400 K/uL Final   nRBC 08/16/2023 0.0  0.0 - 0.2 % Final   Performed at [facility], [address], [place], [place] [address]   Troponin I (High Sensitivity) 08/16/2023 21 (H)  <18 ng/L Final   Comment: (NOTE) Elevated high sensitivity troponin I (hsTnI) values and significant  changes across serial measurements may suggest ACS but many other  chronic and acute conditions are known to elevate hsTnI results.  Refer to the "Links" section for chest pain algorithms and additional  guidance. Performed at [facility], [address] [place], [place] [address]    Troponin I (High Sensitivity) 08/16/2023 20 (H)  <18 ng/L Final   Comment: (NOTE) Elevated high sensitivity troponin I (hsTnI) values and  significant  changes across serial measurements may suggest ACS but many other  chronic and acute conditions are known to elevate hsTnI results.  Refer to the "Links" section for chest pain algorithms and additional  guidance. Performed at [facility], [address], [place], [place] [address]   Admission on 06/27/2023, Discharged on 06/29/2023  Component Date Value Ref Range Status   POC Amphetamine UR 06/27/2023 None Detected  NONE DETECTED (Cut Off Level 1000 ng/mL) Final   POC Secobarbital (BAR) 06/27/2023 None Detected  NONE DETECTED (Cut Off Level 300 ng/mL) Final   POC Buprenorphine (BUP) 06/27/2023 None Detected  NONE DETECTED (Cut Off Level 10 ng/mL) Final   POC Oxazepam (BZO) 06/27/2023 None Detected  NONE DETECTED (Cut Off Level 300 ng/mL) Final   POC Cocaine UR 06/27/2023 None Detected  NONE DETECTED (Cut Off Level 300 ng/mL) Final   POC Methamphetamine UR 06/27/2023 None Detected  NONE DETECTED (Cut Off Level 1000 ng/mL) Final   POC Morphine 06/27/2023 None Detected  NONE DETECTED (Cut Off Level 300 ng/mL) Final   POC Methadone UR 06/27/2023 None Detected  NONE DETECTED (Cut  Off Level 300 ng/mL) Final   POC Oxycodone UR 06/27/2023 None Detected  NONE DETECTED (Cut Off Level 100 ng/mL) Final   POC Marijuana UR 06/27/2023 Positive (A)  NONE DETECTED (Cut Off Level 50 ng/mL) Final  Admission on 06/23/2023, Discharged on 06/27/2023  Component Date Value Ref Range Status   WBC 06/23/2023 4.4  4.0 - 10.5 K/uL Final   RBC 06/23/2023 4.93  4.22 - 5.81 MIL/uL Final   Hemoglobin 06/23/2023 14.2  13.0 - 17.0 g/dL Final   HCT 16/02/9603 41.6  39.0 - 52.0 % Final   MCV 06/23/2023 84.4  80.0 - 100.0 fL Final   MCH 06/23/2023 28.8  26.0 - 34.0 pg Final   MCHC 06/23/2023 34.1  30.0 - 36.0 g/dL Final   RDW 54/01/8118 14.2  11.5 - 15.5 % Final   Platelets 06/23/2023 224  150 - 400 K/uL Final   nRBC 06/23/2023 0.0  0.0 - 0.2 % Final   Neutrophils Relative % 06/23/2023 38  % Final    Neutro Abs 06/23/2023 1.7  1.7 - 7.7 K/uL Final   Lymphocytes Relative 06/23/2023 43  % Final   Lymphs Abs 06/23/2023 1.9  0.7 - 4.0 K/uL Final   Monocytes Relative 06/23/2023 12  % Final   Monocytes Absolute 06/23/2023 0.5  0.1 - 1.0 K/uL Final   Eosinophils Relative 06/23/2023 6  % Final   Eosinophils Absolute 06/23/2023 0.3  0.0 - 0.5 K/uL Final   Basophils Relative 06/23/2023 0  % Final   Basophils Absolute 06/23/2023 0.0  0.0 - 0.1 K/uL Final   Immature Granulocytes 06/23/2023 1  % Final   Abs Immature Granulocytes 06/23/2023 0.02  0.00 - 0.07 K/uL Final   Performed at [facility], [address], [place], [place] [address]   Sodium 06/23/2023 139  135 - 145 mmol/L Final   Potassium 06/23/2023 3.7  3.5 - 5.1 mmol/L Final   Chloride 06/23/2023 107  98 - 111 mmol/L Final   CO2 06/23/2023 24  22 - 32 mmol/L Final   Glucose, Bld 06/23/2023 112 (H)  70 - 99 mg/dL Final   Glucose reference range applies only to samples taken after fasting for at least 8 hours.   BUN 06/23/2023 9  6 - 20 mg/dL Final   Creatinine, Ser 06/23/2023 0.88  0.61 - 1.24 mg/dL Final   Calcium 95/62/1308 9.3  8.9 - 10.3 mg/dL Final   Total Protein 65/78/4696 6.8  6.5 - 8.1 g/dL Final   Albumin 29/52/8413 3.6  3.5 - 5.0 g/dL Final   AST 24/40/1027 22  15 - 41 U/L Final   ALT 06/23/2023 16  0 - 44 U/L Final   Alkaline Phosphatase 06/23/2023 54  38 - 126 U/L Final   Total Bilirubin 06/23/2023 0.6  0.0 - 1.2 mg/dL Final   GFR, Estimated 06/23/2023 >60  >60 mL/min Final   Comment: (NOTE) Calculated using the CKD-EPI Creatinine Equation (2021)    Anion gap 06/23/2023 8  5 - 15 Final   Performed at [facility], [address], [place], [place] [address]   Hgb A1c MFr Bld 06/23/2023 5.1  4.8 - 5.6 % Final   Comment: (NOTE) Pre diabetes:          5.7%-6.4%  Diabetes:              >6.4%  Glycemic control for   <7.0% adults with diabetes    Mean Plasma Glucose 06/23/2023 99.67  mg/dL Final  Performed at [facility], [address], [place], [place] [address]   Alcohol, Ethyl (B) 06/23/2023 <10  <10 mg/dL Final   Comment: (NOTE) Lowest detectable limit for serum alcohol is 10 mg/dL.  For medical purposes only. Performed at [facility], [address], [place], [place] [address]    POC Amphetamine UR 06/23/2023 None Detected  NONE DETECTED (Cut Off Level 1000 ng/mL) Final   POC Secobarbital (BAR) 06/23/2023 None Detected  NONE DETECTED (Cut Off Level 300 ng/mL) Final   POC Buprenorphine (BUP) 06/23/2023 None Detected  NONE DETECTED (Cut Off Level 10 ng/mL) Final   POC Oxazepam (BZO) 06/23/2023 None Detected  NONE DETECTED (Cut Off Level 300 ng/mL) Final   POC Cocaine UR 06/23/2023 Positive (A)  NONE DETECTED (Cut Off Level 300 ng/mL) Final   POC Methamphetamine UR 06/23/2023 None Detected  NONE DETECTED (Cut Off Level 1000 ng/mL) Final   POC Morphine 06/23/2023 None Detected  NONE DETECTED (Cut Off Level 300 ng/mL) Final   POC Methadone UR 06/23/2023 None Detected  NONE DETECTED (Cut Off Level 300 ng/mL) Final   POC Oxycodone UR 06/23/2023 None Detected  NONE DETECTED (Cut Off Level 100 ng/mL) Final   POC Marijuana UR 06/23/2023 Positive (A)  NONE DETECTED (Cut Off Level 50 ng/mL) Final   Cholesterol 06/23/2023 192  0 - 200 mg/dL Final   Triglycerides 09/81/1914 46  <150 mg/dL Final   HDL 78/29/5621 76  >40 mg/dL Final   Total CHOL/HDL Ratio 06/23/2023 2.5  RATIO Final   VLDL 06/23/2023 9  0 - 40 mg/dL Final   LDL Cholesterol 06/23/2023 107 (H)  0 - 99 mg/dL Final   Comment:        Total Cholesterol/HDL:CHD Risk Coronary Heart Disease Risk Table                     Men   Women  1/2 Average Risk   3.4   3.3  Average Risk       5.0   4.4  2 X Average Risk   9.6   7.1  3 X Average Risk  23.4   11.0        Use the calculated Patient Ratio above and the CHD Risk Table to determine the patient's CHD Risk.        ATP III CLASSIFICATION (LDL):  <100     mg/dL    Optimal  308-657  mg/dL   Near or Above                    Optimal  130-159  mg/dL   Borderline  846-962  mg/dL   High  >952     mg/dL   Very High Performed at [facility], [address], [place], [place] [address]    TSH 06/23/2023 1.229  0.350 - 4.500 uIU/mL Final   Comment: Performed by a 3rd Generation assay with a functional sensitivity of <=0.01 uIU/mL. Performed at [facility], [address] [place], [place] [address]   Admission on 06/20/2023, Discharged on 06/20/2023  Component Date Value Ref Range Status   Sodium 06/20/2023 134 (L)  135 - 145 mmol/L Final   Potassium 06/20/2023 3.9  3.5 - 5.1 mmol/L Final   Chloride 06/20/2023 100  98 - 111 mmol/L Final   CO2 06/20/2023 22  22 - 32 mmol/L Final   Glucose, Bld 06/20/2023 77  70 - 99 mg/dL Final  Glucose reference range applies only to samples taken after fasting for at least 8 hours.   BUN 06/20/2023 8  6 - 20 mg/dL Final   Creatinine, Ser 06/20/2023 0.85  0.61 - 1.24 mg/dL Final   Calcium 16/02/9603 9.0  8.9 - 10.3 mg/dL Final   GFR, Estimated 06/20/2023 >60  >60 mL/min Final   Comment: (NOTE) Calculated using the CKD-EPI Creatinine Equation (2021)    Anion gap 06/20/2023 12  5 - 15 Final   Performed at [facility], [address], [place], [place] [address]   WBC 06/20/2023 5.0  4.0 - 10.5 K/uL Final   RBC 06/20/2023 5.19  4.22 - 5.81 MIL/uL Final   Hemoglobin 06/20/2023 15.2  13.0 - 17.0 g/dL Final   HCT 11/91/4782 43.9  39.0 - 52.0 % Final   MCV 06/20/2023 84.6  80.0 - 100.0 fL Final   MCH 06/20/2023 29.3  26.0 - 34.0 pg Final   MCHC 06/20/2023 34.6  30.0 - 36.0 g/dL Final   RDW 95/62/1308 14.1  11.5 - 15.5 % Final   Platelets 06/20/2023 239  150 - 400 K/uL Final   nRBC 06/20/2023 0.0  0.0 - 0.2 % Final   Performed at [facility], [address], [place], [place] [address]   Troponin I (High Sensitivity) 06/20/2023 21 (H)  <18 ng/L Final   Comment: (NOTE) Elevated high  sensitivity troponin I (hsTnI) values and significant  changes across serial measurements may suggest ACS but many other  chronic and acute conditions are known to elevate hsTnI results.  Refer to the "Links" section for chest pain algorithms and additional  guidance. Performed at [facility], [address] [place], [place] [address]    B Natriuretic Peptide 06/20/2023 54.9  0.0 - 100.0 pg/mL Final   Performed at [facility], [address], [place], [place] [address]   Troponin I (High Sensitivity) 06/20/2023 19 (H)  <18 ng/L Final   Comment: (NOTE) Elevated high sensitivity troponin I (hsTnI) values and significant  changes across serial measurements may suggest ACS but many other  chronic and acute conditions are known to elevate hsTnI results.  Refer to the "Links" section for chest pain algorithms and additional  guidance. Performed at [facility], [address] [place], [place] [address]   Admission on 06/17/2023, Discharged on 06/18/2023  Component Date Value Ref Range Status   Sodium 06/17/2023 137  135 - 145 mmol/L Final   Potassium 06/17/2023 3.4 (L)  3.5 - 5.1 mmol/L Final   Chloride 06/17/2023 102  98 - 111 mmol/L Final   CO2 06/17/2023 23  22 - 32 mmol/L Final   Glucose, Bld 06/17/2023 84  70 - 99 mg/dL Final   Glucose reference range applies only to samples taken after fasting for at least 8 hours.   BUN 06/17/2023 5 (L)  6 - 20 mg/dL Final   Creatinine, Ser 06/17/2023 1.01  0.61 - 1.24 mg/dL Final   Calcium 03/10/2535 8.9  8.9 - 10.3 mg/dL Final   GFR, Estimated 06/17/2023 >60  >60 mL/min Final   Comment: (NOTE) Calculated using the CKD-EPI Creatinine Equation (2021)    Anion gap 06/17/2023 12  5 - 15 Final   Performed at [facility], [address], [place], [place] [address]   WBC 06/17/2023 5.0  4.0 - 10.5 K/uL Final   RBC 06/17/2023 4.63  4.22 - 5.81 MIL/uL Final   Hemoglobin 06/17/2023  13.4  13.0 - 17.0 g/dL Final   HCT  96/08/5407 39.7  39.0 - 52.0 % Final   MCV 06/17/2023 85.7  80.0 - 100.0 fL Final   MCH 06/17/2023 28.9  26.0 - 34.0 pg Final   MCHC 06/17/2023 33.8  30.0 - 36.0 g/dL Final   RDW 81/19/1478 14.6  11.5 - 15.5 % Final   Platelets 06/17/2023 234  150 - 400 K/uL Final   nRBC 06/17/2023 0.0  0.0 - 0.2 % Final   Performed at [facility], [address], [place], [place] [address]   Troponin I (High Sensitivity) 06/17/2023 36 (H)  <18 ng/L Final   Comment: (NOTE) Elevated high sensitivity troponin I (hsTnI) values and significant  changes across serial measurements may suggest ACS but many other  chronic and acute conditions are known to elevate hsTnI results.  Refer to the "Links" section for chest pain algorithms and additional  guidance. Performed at [facility], [address], [place], [place] [address]    B Natriuretic Peptide 06/17/2023 185.1 (H)  0.0 - 100.0 pg/mL Final   Performed at [facility], [address] [place], [place] [address]   SARS Coronavirus 2 by RT PCR 06/17/2023 NEGATIVE  NEGATIVE Final   Influenza A by PCR 06/17/2023 NEGATIVE  NEGATIVE Final   Influenza B by PCR 06/17/2023 NEGATIVE  NEGATIVE Final   Comment: (NOTE) The Xpert Xpress SARS-CoV-2/FLU/RSV plus assay is intended as an aid in the diagnosis of influenza from Nasopharyngeal swab specimens and should not be used as a sole basis for treatment. Nasal washings and aspirates are unacceptable for Xpert Xpress SARS-CoV-2/FLU/RSV testing.  Fact Sheet for Patients: BloggerCourse.com  Fact Sheet for Healthcare Providers: SeriousBroker.it  This test is not yet approved or cleared by the [place] FDA and has been authorized for detection and/or diagnosis of SARS-CoV-2 by FDA under an Emergency Use Authorization (EUA). This EUA will remain in effect (meaning this test can be used) for the duration of the COVID-19 declaration under  Section 564(b)(1) of the Act, 21 U.S.C. section 360bbb-3(b)(1), unless the authorization is terminated or revoked.     Resp Syncytial Virus by PCR 06/17/2023 NEGATIVE  NEGATIVE Final   Comment: (NOTE) Fact Sheet for Patients: BloggerCourse.com  Fact Sheet for Healthcare Providers: SeriousBroker.it  This test is not yet approved or cleared by the [place] FDA and has been authorized for detection and/or diagnosis of SARS-CoV-2 by FDA under an Emergency Use Authorization (EUA). This EUA will remain in effect (meaning this test can be used) for the duration of the COVID-19 declaration under Section 564(b)(1) of the Act, 21 U.S.C. section 360bbb-3(b)(1), unless the authorization is terminated or revoked.  Performed at [facility], [address], [place], [place] [address]    Troponin I (High Sensitivity) 06/17/2023 37 (H)  <18 ng/L Final   Comment: (NOTE) Elevated high sensitivity troponin I (hsTnI) values and significant  changes across serial measurements may suggest ACS but many other  chronic and acute conditions are known to elevate hsTnI results.  Refer to the "Links" section for chest pain algorithms and additional  guidance. Performed at [facility], [address], [place], [place] [address]     Allergies: Patient has no known allergies.  Medications:  Facility Ordered Medications  Medication   [COMPLETED] thiamine (VITAMIN B1) injection 100 mg   [EXPIRED] LORazepam (ATIVAN) tablet 1 mg   [COMPLETED] LORazepam (ATIVAN) tablet 1 mg  Followed by   [COMPLETED] LORazepam (ATIVAN) tablet 1 mg   Followed by   [COMPLETED] LORazepam (ATIVAN) tablet 1 mg   Followed by   [COMPLETED] LORazepam (ATIVAN) tablet 1 mg   acetaminophen (TYLENOL) tablet 650 mg   alum & mag hydroxide-simeth (MAALOX/MYLANTA) 200-200-20 MG/5ML suspension 30 mL   magnesium hydroxide (MILK OF MAGNESIA) suspension 30 mL    haloperidol (HALDOL) tablet 5 mg   And   diphenhydrAMINE (BENADRYL) capsule 50 mg   haloperidol lactate (HALDOL) injection 5 mg   And   diphenhydrAMINE (BENADRYL) injection 50 mg   And   LORazepam (ATIVAN) injection 2 mg   haloperidol lactate (HALDOL) injection 10 mg   And   diphenhydrAMINE (BENADRYL) injection 50 mg   And   LORazepam (ATIVAN) injection 2 mg   hydrOXYzine (ATARAX) tablet 25 mg   [COMPLETED] cloNIDine (CATAPRES) tablet 0.1 mg   multivitamin with minerals tablet 1 tablet   [EXPIRED] loperamide (IMODIUM) capsule 2-4 mg   [EXPIRED] ondansetron (ZOFRAN-ODT) disintegrating tablet 4 mg   thiamine (VITAMIN B1) tablet 100 mg   [COMPLETED] cloNIDine (CATAPRES) tablet 0.1 mg   cloNIDine (CATAPRES) tablet 0.1 mg   carvedilol (COREG) tablet 12.5 mg   sacubitril-valsartan (ENTRESTO) 24-26 mg per tablet   atorvastatin (LIPITOR) tablet 80 mg   furosemide (LASIX) tablet 40 mg   [COMPLETED] traZODone (DESYREL) tablet 50 mg   ibuprofen (ADVIL) tablet 200 mg   naltrexone (DEPADE) tablet 50 mg   traZODone (DESYREL) tablet 100 mg   melatonin tablet 5 mg    Long Term Goals: Improvement in symptoms so as ready for discharge  Short Term Goals: Patient will verbalize feelings in meetings with treatment team members., Patient will attend at least of 50% of the groups daily., Patient will participate in completing the [place] Suicide Severity Rating Scale, Patient will score a low risk of violence for 24 hours prior to discharge, and Patient will take medications as prescribed daily.  Medical Decision Making  Alcohol Use Disorder EtOH 172, LFTs WNL -Ativan taper completed in 4/13 -D/c CIWA as scores have been <4 for >24 hours  -Change Naltrexone 50 mg to qam from at bedtime -Thiamine 100 mg IM first day and PO after that -Multivitamin with minerals daily -Tylenol 650 mg every 6 hours as needed for pain -Zofran 4 mg every 6 hours as needed for nausea or vomiting -Imodium 2 to 4 mg  as needed for diarrhea or loose stools  -Maalox/Mylanta 30 mL every 4 hours as needed for indigestion -Milk of Mag 30 mL as needed for constipation   Stimulant Use Disorder (cocaine type) -Encourage cessation -PRN Tylenol, maalox/mylanta, milk of magnesia, ibuprofen   Insomnia -Increase trazodone to 100 mg at bedtime -Start melatonin 5 mg at bedtime   Medical:  HTN-   -stop losartan  -restart home entresto bid  -restart home lasix 40 mg daily prn  -restart home coreg 12.5 mg bid  -clonidine 0.1 mg tid prn for SBP>150 and DBP>100  Dyslipidemia -restart home lipitor 80 mg daily  Dispo: Residential pending   Joice Nares, MD 08/27/23  9:27 AM

## 2023-08-28 DIAGNOSIS — F1590 Other stimulant use, unspecified, uncomplicated: Secondary | ICD-10-CM | POA: Diagnosis not present

## 2023-08-28 DIAGNOSIS — E785 Hyperlipidemia, unspecified: Secondary | ICD-10-CM | POA: Diagnosis not present

## 2023-08-28 DIAGNOSIS — F1010 Alcohol abuse, uncomplicated: Secondary | ICD-10-CM | POA: Diagnosis not present

## 2023-08-28 DIAGNOSIS — F1490 Cocaine use, unspecified, uncomplicated: Secondary | ICD-10-CM | POA: Diagnosis not present

## 2023-08-28 MED ORDER — ATORVASTATIN CALCIUM 80 MG PO TABS: 80.0000 mg | ORAL_TABLET | Freq: Every day | ORAL | 0 refills | Status: DC

## 2023-08-28 MED ORDER — SACUBITRIL-VALSARTAN 24-26 MG PO TABS: 1.0000 | ORAL_TABLET | Freq: Two times a day (BID) | ORAL | 0 refills | Status: DC

## 2023-08-28 MED ORDER — TRAZODONE HCL 100 MG PO TABS: 100.0000 mg | ORAL_TABLET | Freq: Every evening | ORAL | 0 refills | Status: AC | PRN

## 2023-08-28 MED ORDER — CARVEDILOL 12.5 MG PO TABS: 12.5000 mg | ORAL_TABLET | Freq: Two times a day (BID) | ORAL | 0 refills | Status: DC

## 2023-08-28 MED ORDER — MELATONIN 5 MG PO TABS: 5.0000 mg | ORAL_TABLET | Freq: Every day | ORAL | 0 refills | Status: AC

## 2023-08-28 MED ORDER — NALTREXONE HCL 50 MG PO TABS: 50.0000 mg | ORAL_TABLET | Freq: Every day | ORAL | 0 refills | Status: DC

## 2023-08-28 NOTE — ED Notes (Signed)
 Patient is sleeping. Respirations equal and unlabored, skin warm and dry. No change in assessment or acuity. Routine safety checks conducted according to facility protocol. Will continue to monitor for safety.

## 2023-08-28 NOTE — ED Notes (Signed)
 Patient resting with eyes closed in no apparent acute distress. Respirations even and unlabored. Environment secured. Safety checks in place according to facility policy.

## 2023-08-28 NOTE — ED Notes (Signed)
 Patient in the bedroom composed and and concern for not falling asleep despite sleeping medicine given. Pt reassured. Will continue to monitor for safety

## 2023-08-28 NOTE — Group Note (Signed)
 Group Topic: Communication  Group Date: 08/28/2023 Start Time: 1930 End Time: 2000 Facilitators: Wendall Halls B  Department: [facility]  Number of Participants: 5  Group Focus: abuse issues, activities of daily living skills, anger management, check in, communication, coping skills, and daily focus Treatment Modality:  Individual Therapy Interventions utilized were leisure development Purpose: express feelings, express irrational fears, increase insight, and reinforce self-care  Name: Roney Youtz Date of Birth: 04/24/1966  MR: [id number]    Level of Participation: active Quality of Participation: attentive and cooperative Interactions with others: gave feedback Mood/Affect: appropriate Triggers (if applicable): NA Cognition: coherent/clear Progress: Gaining insight Response: NA Plan: patient will be encouraged to keep going to groups.   Patients Problems:  Patient Active Problem List   Diagnosis Date Noted   Substance use disorder 08/22/2023   Cocaine abuse with cocaine-induced mood disorder (HCC) 06/27/2023   Alcohol use disorder, severe, in controlled environment (HCC) 06/27/2023   MDD (major depressive disorder), recurrent episode, severe (HCC) 06/27/2023   Substance abuse (HCC) 06/23/2023   Acute respiratory failure with hypoxia (HCC) 05/06/2021   Acute pulmonary edema (HCC) 05/06/2021   Hypertensive emergency

## 2023-08-28 NOTE — ED Provider Notes (Signed)
 FBC/OBS ASAP Discharge Summary   Date and Time: 08/28/2023 3:00 PM  Name: Joseph Reese  MRN:  [id number]   Discharge Diagnoses:  Final diagnoses:  Alcohol use disorder  Stimulant use disorder    Subjective: Joseph Reese is a 58 year old male with a past psychiatric history of alcohol use disorder and stimulant use disorder cocaine type who presents to the [facility] for detox and substance use treatment.   Stay Summary: The patient was evaluated each day by a clinical provider to ascertain response to treatment. Improvement was noted by the patient's report of decreasing symptoms, improved sleep and appetite, affect, medication tolerance, behavior, and participation in unit programming.  Patient was asked each day to complete a self inventory noting mood, mental status, pain, new symptoms, anxiety and concerns.  The patient's medications were managed with the following directions: Alcohol Use Disorder -Naltrexone 50 mg qam  Insomnia -trazodone 100 mg at bedtime -melatonin 5 mg at bedtime    Medical:  HTN             -restart home entresto bid             -restart home lasix 40 mg daily prn             -restart home coreg 12.5 mg bid             -clonidine 0.1 mg tid prn for SBP>150 and DBP>100   Dyslipidemia-restart home lipitor 80 mg daily  Patient responded well to medication and being in a therapeutic and supportive environment. Positive and appropriate behavior was noted and the patient was motivated for recovery. The patient worked closely with the treatment team and case manager to develop a discharge plan with appropriate goals. Coping skills, problem solving as well as relaxation therapies were also part of the unit programming. Patient has denied SI and HI for over 48 hours.    Total Time spent with patient: 20 minutes  Past Psychiatric History:  Dx: AUD, stimulant use d/o Current psychotropic medications: Denies Previous psychotropic medications: Denies Psychiatrist:  Denies Therapist: Denies   Past Medical History:  Dx: HTN Medications: per chart review, losartan   Family Psychiatric History: Denies   Social History:  Living: with daughter Job: Roofing Support: mother, daughter, brother, sister Legal History: denies   Substance History:  Smoking: few cigarettes daily Alcohol:              Onset: 35 years ago             Amount and frequency: 1.5 liquor daily             Last use: 4/9             Hx of withdrawal symptoms: states only tremors, denies Dts and seizures             Periods of sobriety: 1995-1998 Illicit drugs: yes, crack cocaine daily since he was in his 20's; uses three times weekly, $80 worth at a time, most recently used 4/9 Rehab: yes multiple times Tobacco Cessation:  N/A, patient does not currently use tobacco products  Current Medications:  Current Facility-Administered Medications  Medication Dose Route Frequency Provider Last Rate Last Admin   acetaminophen (TYLENOL) tablet 650 mg  650 mg Oral Q6H PRN White, Patrice L, NP   650 mg at 08/28/23 0947   alum & mag hydroxide-simeth (MAALOX/MYLANTA) 200-200-20 MG/5ML suspension 30 mL  30 mL Oral Q4H PRN White, Chrystine Oiler, NP  atorvastatin (LIPITOR) tablet 80 mg  80 mg Oral Daily Ji, Andrew, MD   80 mg at 08/28/23 0947   carvedilol (COREG) tablet 12.5 mg  12.5 mg Oral BID WC Ji, Andrew, MD   12.5 mg at 08/28/23 0900   cloNIDine (CATAPRES) tablet 0.1 mg  0.1 mg Oral BID PRN Augusta Blizzard, MD   0.1 mg at 08/25/23 0639   haloperidol (HALDOL) tablet 5 mg  5 mg Oral TID PRN White, Patrice L, NP   5 mg at 08/27/23 2241   And   diphenhydrAMINE (BENADRYL) capsule 50 mg  50 mg Oral TID PRN White, Patrice L, NP   50 mg at 08/27/23 2241   haloperidol lactate (HALDOL) injection 5 mg  5 mg Intramuscular TID PRN White, Patrice L, NP       And   diphenhydrAMINE (BENADRYL) injection 50 mg  50 mg Intramuscular TID PRN White, Patrice L, NP       And   LORazepam (ATIVAN) injection 2 mg  2  mg Intramuscular TID PRN White, Patrice L, NP       haloperidol lactate (HALDOL) injection 10 mg  10 mg Intramuscular TID PRN White, Patrice L, NP       And   diphenhydrAMINE (BENADRYL) injection 50 mg  50 mg Intramuscular TID PRN White, Patrice L, NP       And   LORazepam (ATIVAN) injection 2 mg  2 mg Intramuscular TID PRN White, Patrice L, NP       furosemide (LASIX) tablet 40 mg  40 mg Oral Daily PRN Augusta Blizzard, MD       hydrOXYzine (ATARAX) tablet 25 mg  25 mg Oral TID PRN White, Patrice L, NP       ibuprofen (ADVIL) tablet 200 mg  200 mg Oral Q6H PRN Ashawnti Tangen B, MD   200 mg at 08/26/23 1007   magnesium hydroxide (MILK OF MAGNESIA) suspension 30 mL  30 mL Oral Daily PRN White, Patrice L, NP       melatonin tablet 5 mg  5 mg Oral QHS Joash Tony B, MD   5 mg at 08/27/23 2122   multivitamin with minerals tablet 1 tablet  1 tablet Oral Daily Jamaia Brum B, MD   1 tablet at 08/28/23 0948   naltrexone (DEPADE) tablet 50 mg  50 mg Oral Daily Masae Lukacs B, MD   50 mg at 08/28/23 9811   sacubitril-valsartan (ENTRESTO) 24-26 mg per tablet  1 tablet Oral BID Augusta Blizzard, MD   1 tablet at 08/28/23 0947   thiamine (VITAMIN B1) tablet 100 mg  100 mg Oral Daily Verlon Carcione B, MD   100 mg at 08/28/23 0948   traZODone (DESYREL) tablet 100 mg  100 mg Oral QHS PRN Kenzlei Runions B, MD   100 mg at 08/27/23 2122   Current Outpatient Medications  Medication Sig Dispense Refill   [START ON 08/29/2023] atorvastatin (LIPITOR) 80 MG tablet Take 1 tablet (80 mg total) by mouth daily. 30 tablet 0   carvedilol (COREG) 12.5 MG tablet Take 1 tablet (12.5 mg total) by mouth 2 (two) times daily with a meal. 60 tablet 0   melatonin 5 MG TABS Take 1 tablet (5 mg total) by mouth at bedtime. 30 tablet 0   [START ON 08/29/2023] naltrexone (DEPADE) 50 MG tablet Take 1 tablet (50 mg total) by mouth daily. 30 tablet 0   sacubitril-valsartan (ENTRESTO) 24-26 MG Take 1 tablet by mouth 2 (two) times daily.  60 tablet  0   traZODone (DESYREL) 100 MG tablet Take 1 tablet (100 mg total) by mouth at bedtime as needed for sleep. 30 tablet 0    PTA Medications:  Facility Ordered Medications  Medication   [COMPLETED] thiamine (VITAMIN B1) injection 100 mg   [EXPIRED] LORazepam (ATIVAN) tablet 1 mg   [COMPLETED] LORazepam (ATIVAN) tablet 1 mg   Followed by   [COMPLETED] LORazepam (ATIVAN) tablet 1 mg   Followed by   [COMPLETED] LORazepam (ATIVAN) tablet 1 mg   Followed by   [COMPLETED] LORazepam (ATIVAN) tablet 1 mg   acetaminophen (TYLENOL) tablet 650 mg   alum & mag hydroxide-simeth (MAALOX/MYLANTA) 200-200-20 MG/5ML suspension 30 mL   magnesium hydroxide (MILK OF MAGNESIA) suspension 30 mL   haloperidol (HALDOL) tablet 5 mg   And   diphenhydrAMINE (BENADRYL) capsule 50 mg   haloperidol lactate (HALDOL) injection 5 mg   And   diphenhydrAMINE (BENADRYL) injection 50 mg   And   LORazepam (ATIVAN) injection 2 mg   haloperidol lactate (HALDOL) injection 10 mg   And   diphenhydrAMINE (BENADRYL) injection 50 mg   And   LORazepam (ATIVAN) injection 2 mg   hydrOXYzine (ATARAX) tablet 25 mg   [COMPLETED] cloNIDine (CATAPRES) tablet 0.1 mg   multivitamin with minerals tablet 1 tablet   [EXPIRED] loperamide (IMODIUM) capsule 2-4 mg   [EXPIRED] ondansetron (ZOFRAN-ODT) disintegrating tablet 4 mg   thiamine (VITAMIN B1) tablet 100 mg   [COMPLETED] cloNIDine (CATAPRES) tablet 0.1 mg   cloNIDine (CATAPRES) tablet 0.1 mg   carvedilol (COREG) tablet 12.5 mg   sacubitril-valsartan (ENTRESTO) 24-26 mg per tablet   atorvastatin (LIPITOR) tablet 80 mg   furosemide (LASIX) tablet 40 mg   [COMPLETED] traZODone (DESYREL) tablet 50 mg   ibuprofen (ADVIL) tablet 200 mg   naltrexone (DEPADE) tablet 50 mg   traZODone (DESYREL) tablet 100 mg   melatonin tablet 5 mg   [COMPLETED] fluticasone (FLONASE) 50 MCG/ACT nasal spray 2 spray   PTA Medications  Medication Sig   [START ON 08/29/2023] atorvastatin (LIPITOR) 80  MG tablet Take 1 tablet (80 mg total) by mouth daily.   carvedilol (COREG) 12.5 MG tablet Take 1 tablet (12.5 mg total) by mouth 2 (two) times daily with a meal.   sacubitril-valsartan (ENTRESTO) 24-26 MG Take 1 tablet by mouth 2 (two) times daily.   traZODone (DESYREL) 100 MG tablet Take 1 tablet (100 mg total) by mouth at bedtime as needed for sleep.   melatonin 5 MG TABS Take 1 tablet (5 mg total) by mouth at bedtime.   [START ON 08/29/2023] naltrexone (DEPADE) 50 MG tablet Take 1 tablet (50 mg total) by mouth daily.       08/25/2023    3:07 PM 08/23/2023   10:04 AM 06/27/2023   10:32 AM  Depression screen PHQ 2/9  Decreased Interest 0 0 0  Down, Depressed, Hopeless 0 0 0  PHQ - 2 Score 0 0 0    Flowsheet Row ED from 08/22/2023 in [facility] Most recent reading at 08/22/2023  4:05 PM ED from 08/22/2023 in [facility] Most recent reading at 08/22/2023  4:00 AM ED from 08/16/2023 in [facility] Emergency Department at [facility] Most recent reading at 08/16/2023  9:51 PM  C-SSRS RISK CATEGORY No Risk No Risk No Risk       Musculoskeletal  Strength & Muscle Tone: within normal limits Gait & Station: normal Patient leans: N/A  Psychiatric Specialty Exam  Presentation  General Appearance:  Appropriate for Environment; Fairly Groomed  Eye Contact: Fair  Speech: Clear and Coherent; Normal Rate  Speech Volume: Normal  Handedness: Right   Mood and Affect  Mood: Euthymic  Affect: Congruent; Appropriate   Thought Process  Thought Processes: Coherent  Descriptions of Associations:Intact  Orientation:Full (Time, Place and Person)  Thought Content:Logical  Diagnosis of Schizophrenia or Schizoaffective disorder in past: No    Hallucinations:Hallucinations: None  Ideas of Reference:None  Suicidal Thoughts:Suicidal Thoughts: No  Homicidal Thoughts:Homicidal Thoughts: No   Sensorium   Memory: Remote Good  Judgment: Fair  Insight: Fair   Art therapist  Concentration: Good  Attention Span: Good  Recall: Good  Fund of Knowledge: Good  Language: Good   Psychomotor Activity  Psychomotor Activity: Psychomotor Activity: Normal   Assets  Assets: Communication Skills; Resilience; Social Support   Sleep  Sleep: Sleep: Good    Physical Exam  Physical Exam  General: Pleasant, well-appearing . No acute distress. Pulmonary: Normal effort. No wheezing or rales. Skin: No obvious rash or lesions. Neuro: A&Ox3.No focal deficit.   Review of Systems  No reported symptoms   Blood pressure 135/78, pulse 66, temperature 98.3 F (36.8 C), temperature source Oral, resp. rate 19, SpO2 99%. There is no height or weight on file to calculate BMI.  Demographic Factors:  Male  Loss Factors: Decline in physical health  Historical Factors: Impulsivity  Risk Reduction Factors:   Employed and Positive social support  Continued Clinical Symptoms:  Alcohol/Substance Abuse/Dependencies  Cognitive Features That Contribute To Risk:  Closed-mindedness    Suicide Risk:  Mild:  Suicidal ideation of limited frequency, intensity, duration, and specificity.  There are no identifiable plans, no associated intent, mild dysphoria and related symptoms, good self-control (both objective and subjective assessment), few other risk factors, and identifiable protective factors, including available and accessible social support.  Plan Of Care/Follow-up recommendations:  Activity as tolerated Regular diet Continue prescription medications See PCP for medical conditions   Disposition: Joseph Hilda, MD 08/28/2023, 3:00 PM

## 2023-08-28 NOTE — Care Management (Signed)
 [facility] Care Management   Per Moira Andrews, patient was accepted to [facility] on Thursday 08-29-2023 at 9am.   [address] [place], [place]  [phone]

## 2023-08-28 NOTE — ED Notes (Signed)
Patient was given dinner. 

## 2023-08-28 NOTE — ED Notes (Signed)
 Patient sitting in dayroom interacting with peers. No acute distress noted. No concerns voiced. Informed patient to notify staff with any needs or assistance. Patient verbalized understanding or agreement. Safety checks in place per facility policy.

## 2023-08-28 NOTE — ED Provider Notes (Signed)
 Facility Based Crisis Progress Note  Date: 08/28/23 Patient Name: Joseph Reese MRN: [id number]  Diagnoses:  Final diagnoses:  Alcohol use disorder  Stimulant use disorder    Joseph Reese is a 58 year old male with a past psychiatric history of alcohol use disorder and stimulant use disorder cocaine type who presents to the [facility] from [facility] for detox and substance use treatment.  Interval History Patient seen in the milieu, no acute distress. Patient reports feeling "okay" today. Patient reports good sleep and good appetite. Regarding withdrawal symptoms, he denies. Regarding cravings, he denies. Patient denies current SI, HI, and AVH. Regarding discharge plans, he feels ready to go to [facility] tomorrow. He denies concerns with his medication regimen.    PHQ 2-9:  Flowsheet Row ED from 06/23/2023 in [facility] Most recent reading at 06/27/2023  8:29 AM ED from 06/23/2023 in [facility] Most recent reading at 06/23/2023  4:18 AM Office Visit from 05/11/2021 in [facility] [place] Most recent reading at 05/11/2021  9:36 AM  Thoughts that you would be better off dead, or of hurting yourself in some way Not at all Several days Not at all  PHQ-9 Total Score 0 19 2       Flowsheet Row ED from 08/22/2023 in [facility] Most recent reading at 08/22/2023  4:05 PM ED from 08/22/2023 in [facility] Most recent reading at 08/22/2023  4:00 AM ED from 08/16/2023 in [facility] Emergency Department at [facility] Most recent reading at 08/16/2023  9:51 PM  C-SSRS RISK CATEGORY No Risk No Risk No Risk       Screenings    Flowsheet Row Most Recent Value  CIWA-Ar Total 0       Total Time spent with patient: 30 minutes  Musculoskeletal  Strength & Muscle Tone: within normal limits Gait & Station: normal Patient leans:  N/A  Psychiatric Specialty Exam  Presentation General Appearance:  Appropriate for Environment; Fairly Groomed  Eye Contact: Fair  Speech: Clear and Coherent; Normal Rate  Speech Volume: Normal  Handedness: Right   Mood and Affect  Mood: Euthymic  Affect: Congruent; Appropriate   Thought Process  Thought Processes: Coherent  Descriptions of Associations:Intact  Orientation:Full (Time, Place and Person)  Thought Content:Logical  Diagnosis of Schizophrenia or Schizoaffective disorder in past: No   Hallucinations:Hallucinations: None   Ideas of Reference:None  Suicidal Thoughts:Suicidal Thoughts: No   Homicidal Thoughts:Homicidal Thoughts: No    Sensorium  Memory: Remote Good  Judgment: Fair  Insight: Fair   Art therapist  Concentration: Good  Attention Span: Good  Recall: Good  Fund of Knowledge: Good  Language: Good   Psychomotor Activity  Psychomotor Activity: Psychomotor Activity: Normal    Assets  Assets: Communication Skills; Resilience; Social Support        Physical Exam Vitals reviewed.  Constitutional:      Appearance: Normal appearance.  HENT:     Head: Normocephalic and atraumatic.  Cardiovascular:     Rate and Rhythm: Normal rate.  Pulmonary:     Effort: Pulmonary effort is normal.  Neurological:     General: No focal deficit present.     Mental Status: He is alert and oriented to person, place, and time.    Review of Systems  Constitutional:  Negative for chills and fever.  Respiratory:  Negative for shortness of breath.  Cardiovascular:  Negative for chest pain and palpitations.  Gastrointestinal:  Negative for nausea and vomiting.  Neurological:  Negative for tremors and headaches.    Blood pressure 135/78, pulse 66, temperature 98.3 F (36.8 C), temperature source Oral, resp. rate 19, SpO2 99%. There is no height or weight on file to calculate BMI.  Past Psychiatric History:   Dx: AUD, stimulant use d/o Current psychotropic medications: Denies Previous psychotropic medications: Denies Psychiatrist: Denies Therapist: Denies  Past Medical History:  Dx: HTN Medications: per chart review, losartan  Family Psychiatric History: Denies  Social History:  Living: with daughter Job: Roofing Support: mother, daughter, brother, sister Legal History: denies  Substance History:  Smoking: few cigarettes daily Alcohol:   Onset: 35 years ago  Amount and frequency: 1.5 liquor daily  Last use: 4/9  Hx of withdrawal symptoms: states only tremors, denies Dts and seizures  Periods of sobriety: 1995-1998 Illicit drugs: yes, crack cocaine daily since he was in his 20's; uses three times weekly, $80 worth at a time, most recently used 4/9 Rehab: yes multiple times  Is the patient at risk to self? No  Has the patient been a risk to self in the past 6 months? No .    Has the patient been a risk to self within the distant past? No   Is the patient a risk to others? No   Has the patient been a risk to others in the past 6 months? No   Has the patient been a risk to others within the distant past? No   Last Labs:  Admission on 08/22/2023  Component Date Value Ref Range Status   Sodium 08/27/2023 139  135 - 145 mmol/L Final   Potassium 08/27/2023 3.7  3.5 - 5.1 mmol/L Final   Chloride 08/27/2023 108  98 - 111 mmol/L Final   CO2 08/27/2023 24  22 - 32 mmol/L Final   Glucose, Bld 08/27/2023 107 (H)  70 - 99 mg/dL Final   Glucose reference range applies only to samples taken after fasting for at least 8 hours.   BUN 08/27/2023 7  6 - 20 mg/dL Final   Creatinine, Ser 08/27/2023 0.90  0.61 - 1.24 mg/dL Final   Calcium 16/02/9603 9.4  8.9 - 10.3 mg/dL Final   GFR, Estimated 08/27/2023 >60  >60 mL/min Final   Comment: (NOTE) Calculated using the CKD-EPI Creatinine Equation (2021)    Anion gap 08/27/2023 7  5 - 15 Final   Performed at [facility], [address], [place], [place] [address]  Admission on 08/22/2023, Discharged on 08/22/2023  Component Date Value Ref Range Status   WBC 08/22/2023 6.1  4.0 - 10.5 K/uL Final   RBC 08/22/2023 5.43  4.22 - 5.81 MIL/uL Final   Hemoglobin 08/22/2023 15.3  13.0 - 17.0 g/dL Final   HCT 11/91/4782 45.3  39.0 - 52.0 % Final   MCV 08/22/2023 83.4  80.0 - 100.0 fL Final   MCH 08/22/2023 28.2  26.0 - 34.0 pg Final   MCHC 08/22/2023 33.8  30.0 - 36.0 g/dL Final   RDW 95/62/1308 14.1  11.5 - 15.5 % Final   Platelets 08/22/2023 293  150 - 400 K/uL Final   nRBC 08/22/2023 0.0  0.0 - 0.2 % Final   Neutrophils Relative % 08/22/2023 52  % Final   Neutro Abs 08/22/2023 3.1  1.7 - 7.7 K/uL Final   Lymphocytes Relative 08/22/2023 37  % Final   Lymphs Abs 08/22/2023 2.3  0.7 - 4.0 K/uL Final   Monocytes Relative 08/22/2023 8  % Final   Monocytes Absolute 08/22/2023 0.5  0.1 - 1.0 K/uL Final   Eosinophils Relative 08/22/2023 3  % Final   Eosinophils Absolute 08/22/2023 0.2  0.0 - 0.5 K/uL Final   Basophils Relative 08/22/2023 0  % Final   Basophils Absolute 08/22/2023 0.0  0.0 - 0.1 K/uL Final   Immature Granulocytes 08/22/2023 0  % Final   Abs Immature Granulocytes 08/22/2023 0.02  0.00 - 0.07 K/uL Final   Performed at [facility], [address] [place], [place] [address]   Sodium 08/22/2023 142  135 - 145 mmol/L Final   Potassium 08/22/2023 3.9  3.5 - 5.1 mmol/L Final   Chloride 08/22/2023 105  98 - 111 mmol/L Final   CO2 08/22/2023 24  22 - 32 mmol/L Final   Glucose, Bld 08/22/2023 82  70 - 99 mg/dL Final   Glucose reference range applies only to samples taken after fasting for at least 8 hours.   BUN 08/22/2023 8  6 - 20 mg/dL Final   Creatinine, Ser 08/22/2023 0.77  0.61 - 1.24 mg/dL Final   Calcium 52/77/8242 9.6  8.9 - 10.3 mg/dL Final   Total Protein 35/36/1443 7.6  6.5 - 8.1 g/dL Final   Albumin 15/40/0867 4.1  3.5 - 5.0 g/dL Final   AST 61/95/0932 25  15 - 41 U/L Final   ALT 08/22/2023 23  0 - 44  U/L Final   Alkaline Phosphatase 08/22/2023 59  38 - 126 U/L Final   Total Bilirubin 08/22/2023 0.6  0.0 - 1.2 mg/dL Final   GFR, Estimated 08/22/2023 >60  >60 mL/min Final   Comment: (NOTE) Calculated using the CKD-EPI Creatinine Equation (2021)    Anion gap 08/22/2023 13  5 - 15 Final   Performed at [facility], [address], [place], [place] [address]   Alcohol, Ethyl (B) 08/22/2023 172 (H)  <10 mg/dL Final   Comment: (NOTE) Lowest detectable limit for serum alcohol is 10 mg/dL.  For medical purposes only. Performed at [facility], [address], [place], [place] [address]    TSH 08/22/2023 0.901  0.350 - 4.500 uIU/mL Final   Comment: Performed by a 3rd Generation assay with a functional sensitivity of <=0.01 uIU/mL. Performed at [facility], [address] [place], [place] [address]    POC Amphetamine UR 08/22/2023 None Detected  NONE DETECTED (Cut Off Level 1000 ng/mL) Final   POC Secobarbital (BAR) 08/22/2023 None Detected  NONE DETECTED (Cut Off Level 300 ng/mL) Final   POC Buprenorphine (BUP) 08/22/2023 None Detected  NONE DETECTED (Cut Off Level 10 ng/mL) Final   POC Oxazepam (BZO) 08/22/2023 None Detected  NONE DETECTED (Cut Off Level 300 ng/mL) Final   POC Cocaine UR 08/22/2023 None Detected  NONE DETECTED (Cut Off Level 300 ng/mL) Final   POC Methamphetamine UR 08/22/2023 None Detected  NONE DETECTED (Cut Off Level 1000 ng/mL) Final   POC Morphine 08/22/2023 None Detected  NONE DETECTED (Cut Off Level 300 ng/mL) Final   POC Methadone UR 08/22/2023 None Detected  NONE DETECTED (Cut Off Level 300 ng/mL) Final   POC Oxycodone UR 08/22/2023 None Detected  NONE DETECTED (Cut Off Level 100 ng/mL) Final   POC Marijuana UR 08/22/2023 None Detected  NONE DETECTED (Cut Off Level 50 ng/mL) Final  Admission on 08/16/2023, Discharged on 08/17/2023  Component Date Value Ref Range Status  Sodium 08/16/2023 138  135 - 145 mmol/L Final   Potassium 08/16/2023 3.6   3.5 - 5.1 mmol/L Final   Chloride 08/16/2023 108  98 - 111 mmol/L Final   CO2 08/16/2023 22  22 - 32 mmol/L Final   Glucose, Bld 08/16/2023 82  70 - 99 mg/dL Final   Glucose reference range applies only to samples taken after fasting for at least 8 hours.   BUN 08/16/2023 9  6 - 20 mg/dL Final   Creatinine, Ser 08/16/2023 0.86  0.61 - 1.24 mg/dL Final   Calcium 16/02/9603 9.3  8.9 - 10.3 mg/dL Final   GFR, Estimated 08/16/2023 >60  >60 mL/min Final   Comment: (NOTE) Calculated using the CKD-EPI Creatinine Equation (2021)    Anion gap 08/16/2023 8  5 - 15 Final   Performed at [facility], [address] [place], [place] [address]   WBC 08/16/2023 5.7  4.0 - 10.5 K/uL Final   RBC 08/16/2023 4.68  4.22 - 5.81 MIL/uL Final   Hemoglobin 08/16/2023 13.1  13.0 - 17.0 g/dL Final   HCT 11/91/4782 38.4 (L)  39.0 - 52.0 % Final   MCV 08/16/2023 82.1  80.0 - 100.0 fL Final   MCH 08/16/2023 28.0  26.0 - 34.0 pg Final   MCHC 08/16/2023 34.1  30.0 - 36.0 g/dL Final   RDW 95/62/1308 14.3  11.5 - 15.5 % Final   Platelets 08/16/2023 276  150 - 400 K/uL Final   nRBC 08/16/2023 0.0  0.0 - 0.2 % Final   Performed at [facility], [address], [place], [place] [address]   Troponin I (High Sensitivity) 08/16/2023 21 (H)  <18 ng/L Final   Comment: (NOTE) Elevated high sensitivity troponin I (hsTnI) values and significant  changes across serial measurements may suggest ACS but many other  chronic and acute conditions are known to elevate hsTnI results.  Refer to the "Links" section for chest pain algorithms and additional  guidance. Performed at [facility], [address], [place], [place] [address]    Troponin I (High Sensitivity) 08/16/2023 20 (H)  <18 ng/L Final   Comment: (NOTE) Elevated high sensitivity troponin I (hsTnI) values and significant  changes across serial measurements may suggest ACS but many other  chronic and acute conditions are known to elevate hsTnI  results.  Refer to the "Links" section for chest pain algorithms and additional  guidance. Performed at [facility], [address], [place], [place] [address]   Admission on 06/27/2023, Discharged on 06/29/2023  Component Date Value Ref Range Status   POC Amphetamine UR 06/27/2023 None Detected  NONE DETECTED (Cut Off Level 1000 ng/mL) Final   POC Secobarbital (BAR) 06/27/2023 None Detected  NONE DETECTED (Cut Off Level 300 ng/mL) Final   POC Buprenorphine (BUP) 06/27/2023 None Detected  NONE DETECTED (Cut Off Level 10 ng/mL) Final   POC Oxazepam (BZO) 06/27/2023 None Detected  NONE DETECTED (Cut Off Level 300 ng/mL) Final   POC Cocaine UR 06/27/2023 None Detected  NONE DETECTED (Cut Off Level 300 ng/mL) Final   POC Methamphetamine UR 06/27/2023 None Detected  NONE DETECTED (Cut Off Level 1000 ng/mL) Final   POC Morphine 06/27/2023 None Detected  NONE DETECTED (Cut Off Level 300 ng/mL) Final   POC Methadone UR 06/27/2023 None Detected  NONE DETECTED (Cut Off Level 300 ng/mL) Final   POC Oxycodone UR 06/27/2023 None Detected  NONE DETECTED (Cut Off Level 100 ng/mL) Final  POC Marijuana UR 06/27/2023 Positive (A)  NONE DETECTED (Cut Off Level 50 ng/mL) Final  Admission on 06/23/2023, Discharged on 06/27/2023  Component Date Value Ref Range Status   WBC 06/23/2023 4.4  4.0 - 10.5 K/uL Final   RBC 06/23/2023 4.93  4.22 - 5.81 MIL/uL Final   Hemoglobin 06/23/2023 14.2  13.0 - 17.0 g/dL Final   HCT 86/57/8469 41.6  39.0 - 52.0 % Final   MCV 06/23/2023 84.4  80.0 - 100.0 fL Final   MCH 06/23/2023 28.8  26.0 - 34.0 pg Final   MCHC 06/23/2023 34.1  30.0 - 36.0 g/dL Final   RDW 62/95/2841 14.2  11.5 - 15.5 % Final   Platelets 06/23/2023 224  150 - 400 K/uL Final   nRBC 06/23/2023 0.0  0.0 - 0.2 % Final   Neutrophils Relative % 06/23/2023 38  % Final   Neutro Abs 06/23/2023 1.7  1.7 - 7.7 K/uL Final   Lymphocytes Relative 06/23/2023 43  % Final   Lymphs Abs 06/23/2023 1.9  0.7 - 4.0  K/uL Final   Monocytes Relative 06/23/2023 12  % Final   Monocytes Absolute 06/23/2023 0.5  0.1 - 1.0 K/uL Final   Eosinophils Relative 06/23/2023 6  % Final   Eosinophils Absolute 06/23/2023 0.3  0.0 - 0.5 K/uL Final   Basophils Relative 06/23/2023 0  % Final   Basophils Absolute 06/23/2023 0.0  0.0 - 0.1 K/uL Final   Immature Granulocytes 06/23/2023 1  % Final   Abs Immature Granulocytes 06/23/2023 0.02  0.00 - 0.07 K/uL Final   Performed at [facility], [address], [place], [place] [address]   Sodium 06/23/2023 139  135 - 145 mmol/L Final   Potassium 06/23/2023 3.7  3.5 - 5.1 mmol/L Final   Chloride 06/23/2023 107  98 - 111 mmol/L Final   CO2 06/23/2023 24  22 - 32 mmol/L Final   Glucose, Bld 06/23/2023 112 (H)  70 - 99 mg/dL Final   Glucose reference range applies only to samples taken after fasting for at least 8 hours.   BUN 06/23/2023 9  6 - 20 mg/dL Final   Creatinine, Ser 06/23/2023 0.88  0.61 - 1.24 mg/dL Final   Calcium 03/10/2535 9.3  8.9 - 10.3 mg/dL Final   Total Protein 64/40/3474 6.8  6.5 - 8.1 g/dL Final   Albumin 25/95/6387 3.6  3.5 - 5.0 g/dL Final   AST 56/43/3295 22  15 - 41 U/L Final   ALT 06/23/2023 16  0 - 44 U/L Final   Alkaline Phosphatase 06/23/2023 54  38 - 126 U/L Final   Total Bilirubin 06/23/2023 0.6  0.0 - 1.2 mg/dL Final   GFR, Estimated 06/23/2023 >60  >60 mL/min Final   Comment: (NOTE) Calculated using the CKD-EPI Creatinine Equation (2021)    Anion gap 06/23/2023 8  5 - 15 Final   Performed at [facility], [address], [place], [place] [address]   Hgb A1c MFr Bld 06/23/2023 5.1  4.8 - 5.6 % Final   Comment: (NOTE) Pre diabetes:          5.7%-6.4%  Diabetes:              >6.4%  Glycemic control for   <7.0% adults with diabetes    Mean Plasma Glucose 06/23/2023 99.67  mg/dL Final   Performed at [facility], [address] [place], [place] [address]   Alcohol, Ethyl (B) 06/23/2023 <10  <10 mg/dL  Final   Comment:  (NOTE) Lowest detectable limit for serum alcohol is 10 mg/dL.  For medical purposes only. Performed at [facility], [address], [place], [place] [address]    POC Amphetamine UR 06/23/2023 None Detected  NONE DETECTED (Cut Off Level 1000 ng/mL) Final   POC Secobarbital (BAR) 06/23/2023 None Detected  NONE DETECTED (Cut Off Level 300 ng/mL) Final   POC Buprenorphine (BUP) 06/23/2023 None Detected  NONE DETECTED (Cut Off Level 10 ng/mL) Final   POC Oxazepam (BZO) 06/23/2023 None Detected  NONE DETECTED (Cut Off Level 300 ng/mL) Final   POC Cocaine UR 06/23/2023 Positive (A)  NONE DETECTED (Cut Off Level 300 ng/mL) Final   POC Methamphetamine UR 06/23/2023 None Detected  NONE DETECTED (Cut Off Level 1000 ng/mL) Final   POC Morphine 06/23/2023 None Detected  NONE DETECTED (Cut Off Level 300 ng/mL) Final   POC Methadone UR 06/23/2023 None Detected  NONE DETECTED (Cut Off Level 300 ng/mL) Final   POC Oxycodone UR 06/23/2023 None Detected  NONE DETECTED (Cut Off Level 100 ng/mL) Final   POC Marijuana UR 06/23/2023 Positive (A)  NONE DETECTED (Cut Off Level 50 ng/mL) Final   Cholesterol 06/23/2023 192  0 - 200 mg/dL Final   Triglycerides 81/19/1478 46  <150 mg/dL Final   HDL 29/56/2130 76  >40 mg/dL Final   Total CHOL/HDL Ratio 06/23/2023 2.5  RATIO Final   VLDL 06/23/2023 9  0 - 40 mg/dL Final   LDL Cholesterol 06/23/2023 107 (H)  0 - 99 mg/dL Final   Comment:        Total Cholesterol/HDL:CHD Risk Coronary Heart Disease Risk Table                     Men   Women  1/2 Average Risk   3.4   3.3  Average Risk       5.0   4.4  2 X Average Risk   9.6   7.1  3 X Average Risk  23.4   11.0        Use the calculated Patient Ratio above and the CHD Risk Table to determine the patient's CHD Risk.        ATP III CLASSIFICATION (LDL):  <100     mg/dL   Optimal  865-784  mg/dL   Near or Above                    Optimal  130-159  mg/dL   Borderline  696-295  mg/dL   High  >284     mg/dL    Very High Performed at [facility], [address] [place], [place] [address]    TSH 06/23/2023 1.229  0.350 - 4.500 uIU/mL Final   Comment: Performed by a 3rd Generation assay with a functional sensitivity of <=0.01 uIU/mL. Performed at [facility], [address], [place], [place] [address]   Admission on 06/20/2023, Discharged on 06/20/2023  Component Date Value Ref Range Status   Sodium 06/20/2023 134 (L)  135 - 145 mmol/L Final   Potassium 06/20/2023 3.9  3.5 - 5.1 mmol/L Final   Chloride 06/20/2023 100  98 - 111 mmol/L Final   CO2 06/20/2023 22  22 - 32 mmol/L Final   Glucose, Bld 06/20/2023 77  70 - 99 mg/dL Final   Glucose reference range applies only to samples taken after fasting for at least 8 hours.   BUN 06/20/2023 8  6 - 20 mg/dL Final   Creatinine, Ser 06/20/2023 0.85  0.61 - 1.24 mg/dL Final   Calcium 02/72/5366 9.0  8.9 - 10.3 mg/dL Final   GFR, Estimated 06/20/2023 >60  >60 mL/min Final   Comment: (NOTE) Calculated using the CKD-EPI Creatinine Equation (2021)    Anion gap 06/20/2023 12  5 - 15 Final   Performed at [facility], [address], [place], [place] [address]   WBC 06/20/2023 5.0  4.0 - 10.5 K/uL Final   RBC 06/20/2023 5.19  4.22 - 5.81 MIL/uL Final   Hemoglobin 06/20/2023 15.2  13.0 - 17.0 g/dL Final   HCT 74/25/9563 43.9  39.0 - 52.0 % Final   MCV 06/20/2023 84.6  80.0 - 100.0 fL Final   MCH 06/20/2023 29.3  26.0 - 34.0 pg Final   MCHC 06/20/2023 34.6  30.0 - 36.0 g/dL Final   RDW 87/56/4332 14.1  11.5 - 15.5 % Final   Platelets 06/20/2023 239  150 - 400 K/uL Final   nRBC 06/20/2023 0.0  0.0 - 0.2 % Final   Performed at [facility], [address] [place], [place] [address]   Troponin I (High Sensitivity) 06/20/2023 21 (H)  <18 ng/L Final   Comment: (NOTE) Elevated high sensitivity troponin I (hsTnI) values and significant  changes across serial measurements may suggest ACS but many other  chronic and acute conditions  are known to elevate hsTnI results.  Refer to the "Links" section for chest pain algorithms and additional  guidance. Performed at [facility], [address], [place], [place] [address]    B Natriuretic Peptide 06/20/2023 54.9  0.0 - 100.0 pg/mL Final   Performed at [facility], [address] [place], [place] [address]   Troponin I (High Sensitivity) 06/20/2023 19 (H)  <18 ng/L Final   Comment: (NOTE) Elevated high sensitivity troponin I (hsTnI) values and significant  changes across serial measurements may suggest ACS but many other  chronic and acute conditions are known to elevate hsTnI results.  Refer to the "Links" section for chest pain algorithms and additional  guidance. Performed at [facility], [address] [place], [place] [address]   Admission on 06/17/2023, Discharged on 06/18/2023  Component Date Value Ref Range Status   Sodium 06/17/2023 137  135 - 145 mmol/L Final   Potassium 06/17/2023 3.4 (L)  3.5 - 5.1 mmol/L Final   Chloride 06/17/2023 102  98 - 111 mmol/L Final   CO2 06/17/2023 23  22 - 32 mmol/L Final   Glucose, Bld 06/17/2023 84  70 - 99 mg/dL Final   Glucose reference range applies only to samples taken after fasting for at least 8 hours.   BUN 06/17/2023 5 (L)  6 - 20 mg/dL Final   Creatinine, Ser 06/17/2023 1.01  0.61 - 1.24 mg/dL Final   Calcium 23/55/7322 8.9  8.9 - 10.3 mg/dL Final   GFR, Estimated 06/17/2023 >60  >60 mL/min Final   Comment: (NOTE) Calculated using the CKD-EPI Creatinine Equation (2021)    Anion gap 06/17/2023 12  5 - 15 Final   Performed at [facility], [address] [place], [place] [address]   WBC 06/17/2023 5.0  4.0 - 10.5 K/uL Final   RBC 06/17/2023 4.63  4.22 - 5.81 MIL/uL Final   Hemoglobin 06/17/2023 13.4  13.0 - 17.0 g/dL Final   HCT 70/62/3762 39.7  39.0 - 52.0 % Final   MCV  06/17/2023 85.7  80.0 - 100.0 fL Final   MCH 06/17/2023 28.9  26.0 - 34.0 pg Final   MCHC 06/17/2023 33.8  30.0  - 36.0 g/dL Final   RDW 84/13/2440 14.6  11.5 - 15.5 % Final   Platelets 06/17/2023 234  150 - 400 K/uL Final   nRBC 06/17/2023 0.0  0.0 - 0.2 % Final   Performed at [facility], [address], [place], [place] [address]   Troponin I (High Sensitivity) 06/17/2023 36 (H)  <18 ng/L Final   Comment: (NOTE) Elevated high sensitivity troponin I (hsTnI) values and significant  changes across serial measurements may suggest ACS but many other  chronic and acute conditions are known to elevate hsTnI results.  Refer to the "Links" section for chest pain algorithms and additional  guidance. Performed at [facility], [address], [place], [place] [address]    B Natriuretic Peptide 06/17/2023 185.1 (H)  0.0 - 100.0 pg/mL Final   Performed at [facility], [address], [place], [place] [address]   SARS Coronavirus 2 by RT PCR 06/17/2023 NEGATIVE  NEGATIVE Final   Influenza A by PCR 06/17/2023 NEGATIVE  NEGATIVE Final   Influenza B by PCR 06/17/2023 NEGATIVE  NEGATIVE Final   Comment: (NOTE) The Xpert Xpress SARS-CoV-2/FLU/RSV plus assay is intended as an aid in the diagnosis of influenza from Nasopharyngeal swab specimens and should not be used as a sole basis for treatment. Nasal washings and aspirates are unacceptable for Xpert Xpress SARS-CoV-2/FLU/RSV testing.  Fact Sheet for Patients: BloggerCourse.com  Fact Sheet for Healthcare Providers: SeriousBroker.it  This test is not yet approved or cleared by the [place]  FDA and has been authorized for detection and/or diagnosis of SARS-CoV-2 by FDA under an Emergency Use Authorization (EUA). This EUA will remain in effect (meaning this test can be used) for the duration of the COVID-19 declaration under Section 564(b)(1) of the Act, 21 U.S.C. section 360bbb-3(b)(1), unless the authorization is terminated or revoked.     Resp Syncytial Virus by PCR 06/17/2023  NEGATIVE  NEGATIVE Final   Comment: (NOTE) Fact Sheet for Patients: BloggerCourse.com  Fact Sheet for Healthcare Providers: SeriousBroker.it  This test is not yet approved or cleared by the [place]  FDA and has been authorized for detection and/or diagnosis of SARS-CoV-2 by FDA under an Emergency Use Authorization (EUA). This EUA will remain in effect (meaning this test can be used) for the duration of the COVID-19 declaration under Section 564(b)(1) of the Act, 21 U.S.C. section 360bbb-3(b)(1), unless the authorization is terminated or revoked.  Performed at [facility], [address], [place], [place] [address]    Troponin I (High Sensitivity) 06/17/2023 37 (H)  <18 ng/L Final   Comment: (NOTE) Elevated high sensitivity troponin I (hsTnI) values and significant  changes across serial measurements may suggest ACS but many other  chronic and acute conditions are known to elevate hsTnI results.  Refer to the "Links" section for chest pain algorithms and additional  guidance. Performed at [facility], [address] [place], [place] [address]     Allergies: Patient has no known allergies.  Medications:  Facility Ordered Medications  Medication   [COMPLETED] thiamine (VITAMIN B1) injection 100 mg   [EXPIRED] LORazepam (ATIVAN) tablet 1 mg   [COMPLETED] LORazepam (ATIVAN) tablet 1 mg   Followed by   [COMPLETED] LORazepam (ATIVAN) tablet 1 mg   Followed by   [COMPLETED] LORazepam (ATIVAN) tablet  1 mg   Followed by   [COMPLETED] LORazepam (ATIVAN) tablet 1 mg   acetaminophen (TYLENOL) tablet 650 mg   alum & mag hydroxide-simeth (MAALOX/MYLANTA) 200-200-20 MG/5ML suspension 30 mL   magnesium hydroxide (MILK OF MAGNESIA) suspension 30 mL   haloperidol (HALDOL) tablet 5 mg   And   diphenhydrAMINE (BENADRYL) capsule 50 mg   haloperidol lactate (HALDOL) injection 5 mg   And   diphenhydrAMINE (BENADRYL)  injection 50 mg   And   LORazepam (ATIVAN) injection 2 mg   haloperidol lactate (HALDOL) injection 10 mg   And   diphenhydrAMINE (BENADRYL) injection 50 mg   And   LORazepam (ATIVAN) injection 2 mg   hydrOXYzine (ATARAX) tablet 25 mg   [COMPLETED] cloNIDine (CATAPRES) tablet 0.1 mg   multivitamin with minerals tablet 1 tablet   [EXPIRED] loperamide (IMODIUM) capsule 2-4 mg   [EXPIRED] ondansetron (ZOFRAN-ODT) disintegrating tablet 4 mg   thiamine (VITAMIN B1) tablet 100 mg   [COMPLETED] cloNIDine (CATAPRES) tablet 0.1 mg   cloNIDine (CATAPRES) tablet 0.1 mg   carvedilol (COREG) tablet 12.5 mg   sacubitril-valsartan (ENTRESTO) 24-26 mg per tablet   atorvastatin (LIPITOR) tablet 80 mg   furosemide (LASIX) tablet 40 mg   [COMPLETED] traZODone (DESYREL) tablet 50 mg   ibuprofen (ADVIL) tablet 200 mg   naltrexone (DEPADE) tablet 50 mg   traZODone (DESYREL) tablet 100 mg   melatonin tablet 5 mg   [COMPLETED] fluticasone (FLONASE) 50 MCG/ACT nasal spray 2 spray    Long Term Goals: Improvement in symptoms so as ready for discharge  Short Term Goals: Patient will verbalize feelings in meetings with treatment team members., Patient will attend at least of 50% of the groups daily., Patient will participate in completing the [place] Suicide Severity Rating Scale, Patient will score a low risk of violence for 24 hours prior to discharge, and Patient will take medications as prescribed daily.  Medical Decision Making  Alcohol Use Disorder EtOH 172, LFTs WNL -Ativan taper completed in 4/13 -D/c CIWA as scores have been <4 for >24 hours  -continue Naltrexone 50 mg qam -Thiamine 100 mg IM first day and PO after that -Multivitamin with minerals daily -Tylenol 650 mg every 6 hours as needed for pain -Zofran 4 mg every 6 hours as needed for nausea or vomiting -Imodium 2 to 4 mg as needed for diarrhea or loose stools  -Maalox/Mylanta 30 mL every 4 hours as needed for indigestion -Milk of Mag  30 mL as needed for constipation   Stimulant Use Disorder (cocaine type) -Encourage cessation -PRN Tylenol, maalox/mylanta, milk of magnesia, ibuprofen   Insomnia -continue trazodone 100 mg at bedtime -continue melatonin 5 mg at bedtime   Medical:  HTN-   -stop losartan  -restart home entresto bid  -restart home lasix 40 mg daily prn  -restart home coreg 12.5 mg bid  -clonidine 0.1 mg tid prn for SBP>150 and DBP>100  Dyslipidemia -restart home lipitor 80 mg daily  Dispo: [facility] for 4/17   Joice Nares, MD 08/28/23  12:06 PM

## 2023-08-28 NOTE — ED Notes (Signed)
 Patient was provided breakfast

## 2023-08-28 NOTE — ED Notes (Signed)
 Patient alert & oriented x4. Denies intent to harm self or others when asked. Denies A/VH. Patient reported pain in head rating 5/10, PRN medication given. No acute distress noted.  Scheduled and PRN medications administered with no complications. Support and encouragement provided. Routine safety checks conducted per facility protocol. Encouraged patient to notify staff if any thoughts of harm towards self or others arise. Patient verbalizes understanding and agreement.

## 2023-08-28 NOTE — ED Notes (Signed)
 Patient alert and Orient, pleasant and sitting in the dayroom watching TV with other patients. Denies SI, HI/AVH. respirations even and unlabored. Environment secured per policy. Will monitor for safety

## 2023-08-28 NOTE — ED Notes (Signed)
 Abnormal BP of 153/88 obtained, scheduled Coreg administered. Patient asymptomatic, in no acute distress. Provider Floyce Hutching, MD made aware.

## 2023-08-28 NOTE — Discharge Instructions (Signed)

## 2023-08-28 NOTE — Group Note (Signed)
 Group Topic: Social Support  Group Date: 08/28/2023 Start Time: 1630 End Time: 1705 Facilitators: Truett Gab, Milissa Alley  Department: [facility]  Number of Participants: 5  Group Focus: check in Treatment Modality:  Psychoeducation Interventions utilized were patient education Purpose: increase insight  Name: Joseph Reese Date of Birth: 05/02/1966  MR: [id number]    Level of Participation: active Quality of Participation: attentive, cooperative, engaged, motivated, and offered feedback Interactions with others: gave feedback Mood/Affect: appropriate, bright, and positive Triggers (if applicable): N/A Cognition: coherent/clear, concrete, and insightful Progress: Gaining insight Response: Patient was taken outside and asked how his time on the unit has impacted him. Patient expressed that it has been "impactful and a nice time"  Plan: patient will be encouraged to continue to attend group  Patients Problems:  Patient Active Problem List   Diagnosis Date Noted   Substance use disorder 08/22/2023   Cocaine abuse with cocaine-induced mood disorder (HCC) 06/27/2023   Alcohol use disorder, severe, in controlled environment (HCC) 06/27/2023   MDD (major depressive disorder), recurrent episode, severe (HCC) 06/27/2023   Substance abuse (HCC) 06/23/2023   Acute respiratory failure with hypoxia (HCC) 05/06/2021   Acute pulmonary edema (HCC) 05/06/2021   Hypertensive emergency

## 2023-08-29 DIAGNOSIS — F1010 Alcohol abuse, uncomplicated: Secondary | ICD-10-CM | POA: Diagnosis not present

## 2023-08-29 DIAGNOSIS — F1490 Cocaine use, unspecified, uncomplicated: Secondary | ICD-10-CM | POA: Diagnosis not present

## 2023-08-29 DIAGNOSIS — F1590 Other stimulant use, unspecified, uncomplicated: Secondary | ICD-10-CM | POA: Diagnosis not present

## 2023-08-29 DIAGNOSIS — E785 Hyperlipidemia, unspecified: Secondary | ICD-10-CM | POA: Diagnosis not present

## 2023-08-29 NOTE — ED Notes (Signed)
 Patient A&Ox4. Denies intent to harm self/others when asked. Denies A/VH. Patient denies any physical complaints when asked. No acute distress noted. Support and encouragement provided. Routine safety checks conducted according to facility protocol. Encouraged patient to notify staff if thoughts of harm toward self or others arise. Patient verbalize understanding and agreement. Will continue to monitor for safety.

## 2023-08-29 NOTE — ED Notes (Signed)
 Patient A&O x 4, ambulatory. Patient discharged in no acute distress. Patient denied SI/HI, A/VH upon discharge. Patient verbalized understanding of all discharge instructions reviewed on AVS via staff, to include follow up appointments, RX's and safety. Suicide safety plan completed and reviewed with Clinical research associate. A copy given to pt. Patient reported mood 10/10.  Pt belongings returned to patient from locker #5 intact. Patient escorted to lobby via staff for transport to [facility] via [employer] taxi. Safety maintained.

## 2023-08-29 NOTE — ED Notes (Signed)
 Pt has came up to the nursing station and asked while [facility] doing the rounds if he can have something for sleep. RN made aware.

## 2023-08-29 NOTE — Group Note (Signed)
 Group Topic: Communication  Group Date: 08/29/2023 Start Time: 0815 End Time: 0825 Facilitators: Denzil Flatten, RN  Department: [facility]  Number of Participants: 2  Group Focus: discharge education Treatment Modality:  Leisure Development Interventions utilized were patient education Purpose: relapse prevention strategies  Name: Joseph Reese Date of Birth: Feb 26, 1966  MR: [id number]    Level of Participation: active Quality of Participation: attentive and cooperative Interactions with others: gave feedback Mood/Affect: appropriate Triggers (if applicable): none identified Cognition: coherent/clear Progress: Significant Response: Pt verbalized understanding of all discharge instructions reviewed on AVS Plan: patient will be encouraged to continue residential tx and utilized learned coping skills for cravings  Patients Problems:  Patient Active Problem List   Diagnosis Date Noted   Substance use disorder 08/22/2023   Cocaine abuse with cocaine-induced mood disorder (HCC) 06/27/2023   Alcohol use disorder, severe, in controlled environment (HCC) 06/27/2023   MDD (major depressive disorder), recurrent episode, severe (HCC) 06/27/2023   Substance abuse (HCC) 06/23/2023   Acute respiratory failure with hypoxia (HCC) 05/06/2021   Acute pulmonary edema (HCC) 05/06/2021   Hypertensive emergency

## 2023-08-29 NOTE — ED Notes (Signed)
 Pt in the bedroom calm and sleeping. NAD. Will continue to monitor for safety.

## 2023-08-29 NOTE — ED Notes (Signed)
 Pt awake this time using the bathroom.

## 2023-09-16 ENCOUNTER — Ambulatory Visit: Admitting: Physician Assistant

## 2023-09-16 ENCOUNTER — Encounter: Payer: Self-pay | Admitting: Physician Assistant

## 2023-09-16 VITALS — BP 157/78 | HR 64 | Ht 69.0 in | Wt 209.0 lb

## 2023-09-16 DIAGNOSIS — E782 Mixed hyperlipidemia: Secondary | ICD-10-CM

## 2023-09-16 DIAGNOSIS — I10 Essential (primary) hypertension: Secondary | ICD-10-CM

## 2023-09-16 DIAGNOSIS — R252 Cramp and spasm: Secondary | ICD-10-CM | POA: Diagnosis not present

## 2023-09-16 DIAGNOSIS — D229 Melanocytic nevi, unspecified: Secondary | ICD-10-CM

## 2023-09-16 DIAGNOSIS — Z8679 Personal history of other diseases of the circulatory system: Secondary | ICD-10-CM

## 2023-09-16 DIAGNOSIS — F1010 Alcohol abuse, uncomplicated: Secondary | ICD-10-CM

## 2023-09-16 HISTORY — DX: Personal history of other diseases of the circulatory system: Z86.79

## 2023-09-16 HISTORY — DX: Essential (primary) hypertension: I10

## 2023-09-16 HISTORY — DX: Melanocytic nevi, unspecified: D22.9

## 2023-09-16 HISTORY — DX: Mixed hyperlipidemia: E78.2

## 2023-09-16 MED ORDER — SACUBITRIL-VALSARTAN 24-26 MG PO TABS: 1.0000 | ORAL_TABLET | Freq: Two times a day (BID) | ORAL | 0 refills | Status: DC

## 2023-09-16 MED ORDER — CARVEDILOL 12.5 MG PO TABS: 12.5000 mg | ORAL_TABLET | Freq: Two times a day (BID) | ORAL | 0 refills | Status: DC

## 2023-09-16 MED ORDER — ATORVASTATIN CALCIUM 80 MG PO TABS: 80.0000 mg | ORAL_TABLET | Freq: Every day | ORAL | 0 refills | Status: DC

## 2023-09-16 MED ORDER — NALTREXONE HCL 50 MG PO TABS: 50.0000 mg | ORAL_TABLET | Freq: Every day | ORAL | 0 refills | Status: DC

## 2023-09-16 NOTE — Progress Notes (Unsigned)
 New Patient Office Visit  Subjective    Patient ID: Joseph Reese, male    DOB: 06-Jan-1966  Age: 58 y.o. MRN: [id number]  CC:  Chief Complaint  Patient presents with   Medication Refill   muscle cramps   Discussed the use of AI scribe software for clinical note transcription with the patient, who gave verbal consent to proceed.  History of Present Illness   Joseph Reese is a 58 year old male with history of heart failure who presents with leg cramping.  He experiences cramping in his calves, primarily at night when lying down, ongoing for a couple of weeks. The cramps are described as achy and tend to subside with banana consumption. He has been on atorvastatin  for about a year and takes it consistently. His blood pressure readings have been slightly elevated, with a recent reading of 157/78. He takes Entresto  and carvedilol  for blood pressure management. He drinks up to a gallon of water daily, leading to increased urination at night. He does not take diuretics anymore and does not have diabetes. He occasionally takes Benadryl  to help with sleep. His potassium levels were normal two weeks ago, and he was not experiencing cramping at that time. He has not started any new medications recently.      Outpatient Encounter Medications as of 09/16/2023  Medication Sig   atorvastatin  (LIPITOR) 80 MG tablet Take 1 tablet (80 mg total) by mouth daily.   carvedilol  (COREG ) 12.5 MG tablet Take 1 tablet (12.5 mg total) by mouth 2 (two) times daily with a meal.   melatonin 5 MG TABS Take 1 tablet (5 mg total) by mouth at bedtime.   naltrexone  (DEPADE) 50 MG tablet Take 1 tablet (50 mg total) by mouth daily.   sacubitril -valsartan  (ENTRESTO ) 24-26 MG Take 1 tablet by mouth 2 (two) times daily.   traZODone  (DESYREL ) 100 MG tablet Take 1 tablet (100 mg total) by mouth at bedtime as needed for sleep.   [DISCONTINUED] atorvastatin  (LIPITOR) 80 MG tablet Take 1 tablet (80 mg total) by mouth daily.    [DISCONTINUED] carvedilol  (COREG ) 12.5 MG tablet Take 1 tablet (12.5 mg total) by mouth 2 (two) times daily with a meal.   [DISCONTINUED] naltrexone  (DEPADE) 50 MG tablet Take 1 tablet (50 mg total) by mouth daily.   [DISCONTINUED] sacubitril -valsartan  (ENTRESTO ) 24-26 MG Take 1 tablet by mouth 2 (two) times daily.   No facility-administered encounter medications on file as of 09/16/2023.    Past Medical History:  Diagnosis Date   CHF (congestive heart failure), NYHA class II (HCC)    Cocaine abuse (HCC)    Diabetes mellitus without complication (HCC)    ETOH abuse    Hypertension    Tobacco abuse     Past Surgical History:  Procedure Laterality Date   RIGHT/LEFT HEART CATH AND CORONARY ANGIOGRAPHY N/A 01/18/2023   Procedure: RIGHT/LEFT HEART CATH AND CORONARY ANGIOGRAPHY;  Surgeon: Mardell Shade, MD;  Location: [facility] INVASIVE CV LAB;  Service: Cardiovascular;  Laterality: N/A;    Family History  Problem Relation Age of Onset   Diabetes Mellitus II Brother    Coronary artery disease Brother 26       MI   Coronary artery disease Brother 19       MI   Coronary artery disease Brother 39       MI    Social History   Socioeconomic History   Marital status: Single    Spouse name: Not on file  Number of children: 4   Years of education: Not on file   Highest education level: High school graduate  Occupational History   Occupation: unemployed    Comment: contract roofer--unable to work since 12/2020, d/t physical  Tobacco Use   Smoking status: Every Day    Current packs/day: 0.50    Average packs/day: 0.5 packs/day for 40.0 years (20.0 ttl pk-yrs)    Types: Cigarettes   Smokeless tobacco: Never  Vaping Use   Vaping status: Never Used  Substance and Sexual Activity   Alcohol use: Not Currently    Alcohol/week: 18.0 standard drinks of alcohol    Types: 12 Cans of beer, 6 Shots of liquor per week    Comment: 40 years of consistent   Drug use: Not Currently    Frequency:  1.0 times per week    Types: Cocaine    Comment: last use 12/21, 3-4x/mo   Sexual activity: Not on file  Other Topics Concern   Not on file  Social History Narrative   Not on file   Social Drivers of Health   Financial Resource Strain: High Risk (01/02/2023)   Overall Financial Resource Strain (CARDIA)    Difficulty of Paying Living Expenses: Hard  Food Insecurity: Food Insecurity Present (08/22/2023)   Hunger Vital Sign    Worried About Running Out of Food in the Last Year: Never true    Ran Out of Food in the Last Year: Sometimes true  Transportation Needs: No Transportation Needs (08/22/2023)   PRAPARE - Administrator, Civil Service (Medical): No    Lack of Transportation (Non-Medical): No  Physical Activity: Not on File (05/25/2019)   Received from [facility]   Physical Activity    Physical Activity: 0  Recent Concern: Physical Activity - At Risk (05/25/2019)   Received from [facility]   Physical Activity    Physical Activity: 2  Stress: Not on File (05/25/2019)   Received from [facility]   Stress    Stress: 0  Social Connections: Not on File (05/25/2019)   Received from [facility]   Social Connections    Connectedness: 0  Intimate Partner Violence: Not At Risk (08/22/2023)   Humiliation, Afraid, Rape, and Kick questionnaire    Fear of Current or Ex-Partner: No    Emotionally Abused: No    Physically Abused: No    Sexually Abused: No    Review of Systems  Constitutional: Negative.   HENT: Negative.    Eyes: Negative.   Respiratory:  Negative for shortness of breath.   Cardiovascular:  Negative for chest pain.  Gastrointestinal: Negative.   Genitourinary: Negative.   Musculoskeletal:  Positive for myalgias.  Skin: Negative.   Neurological: Negative.   Endo/Heme/Allergies: Negative.   Psychiatric/Behavioral: Negative.          Objective    BP (!) 157/78 (BP Location: Left Arm, Patient Position: Sitting, Cuff Size: Normal)   Pulse 64   Ht 5\' 9"  (1.753 m)   Wt 209  lb (94.8 kg)   SpO2 100%   BMI 30.86 kg/m   Physical Exam Vitals and nursing note reviewed.  Skin:    Comments: See photos; raised appearance     GENERAL: Alert, cooperative, well developed, no acute distress. HEENT: Normocephalic, normal oropharynx, moist mucous membranes. Mole on head with irregular borders, raised. CHEST: Clear to auscultation bilaterally. No wheezes, rhonchi, or crackles. CARDIOVASCULAR: Normal heart rate and rhythm. S1 and S2 normal without murmurs. ABDOMEN: Soft, non-tender, non-distended, without organomegaly. Normal  bowel sounds. EXTREMITIES: No cyanosis or edema. NEUROLOGICAL: Cranial nerves grossly intact. Moves all extremities without gross motor or sensory deficit.       Assessment & Plan:   Problem List Items Addressed This Visit       Cardiovascular and Mediastinum   Essential hypertension - Primary   Relevant Medications   atorvastatin  (LIPITOR) 80 MG tablet   carvedilol  (COREG ) 12.5 MG tablet   sacubitril -valsartan  (ENTRESTO ) 24-26 MG     Musculoskeletal and Integument   Fleshy skin mole     Other   Alcohol abuse   Relevant Medications   naltrexone  (DEPADE) 50 MG tablet   Mixed hyperlipidemia   Relevant Medications   atorvastatin  (LIPITOR) 80 MG tablet   carvedilol  (COREG ) 12.5 MG tablet   sacubitril -valsartan  (ENTRESTO ) 24-26 MG   History of heart failure   Other Visit Diagnoses       Muscle cramps at night       Relevant Orders   Basic metabolic panel with GFR (Completed)     Elevated blood pressure reading in office with diagnosis of hypertension       Relevant Medications   atorvastatin  (LIPITOR) 80 MG tablet   carvedilol  (COREG ) 12.5 MG tablet   sacubitril -valsartan  (ENTRESTO ) 24-26 MG       1. Essential hypertension (Primary) Continue current regimen.  Patient encouraged to continue checking blood pressure on a daily basis, keeping a written log and have it available for all office visits.  Patient to follow-up  with mobile unit in 2 weeks - carvedilol  (COREG ) 12.5 MG tablet; Take 1 tablet (12.5 mg total) by mouth 2 (two) times daily with a meal.  Dispense: 60 tablet; Refill: 0 - sacubitril -valsartan  (ENTRESTO ) 24-26 MG; Take 1 tablet by mouth 2 (two) times daily.  Dispense: 60 tablet; Refill: 0  2. Muscle cramps at night Patient education given on supportive care, lifestyle modifications - Basic metabolic panel with GFR  3. Elevated blood pressure reading in office with diagnosis of hypertension   4. History of heart failure   5. Mixed hyperlipidemia Continue current regimen - atorvastatin  (LIPITOR) 80 MG tablet; Take 1 tablet (80 mg total) by mouth daily.  Dispense: 30 tablet; Refill: 0  6. Fleshy skin mole Documented on chart, patient encouraged to follow-up with dermatology as soon as he is able, patient understands and agrees  7. Alcohol abuse Continue current regimen, currently in substance abuse treatment program - naltrexone  (DEPADE) 50 MG tablet; Take 1 tablet (50 mg total) by mouth daily.  Dispense: 30 tablet; Refill: 0   I have reviewed the patient's medical history (PMH, PSH, Social History, Family History, Medications, and allergies) , and have been updated if relevant. I spent 30 minutes reviewing chart and  face to face time with patient.    Return in about 2 weeks (around 09/30/2023) for With [facility].   Etter Hermann Mayers, PA-C

## 2023-09-16 NOTE — Patient Instructions (Signed)
 VISIT SUMMARY:  During today's visit, we discussed your leg cramping, blood pressure management, heart failure, and a mole on your head. We reviewed your current medications and lifestyle habits to address these issues.  YOUR PLAN:  -HYPERTENSION: Hypertension means high blood pressure. Your recent reading was slightly elevated at 157/78 mmHg. We aim to keep it below 130/80 mmHg, especially given your history of heart failure. Continue taking Entresto  and carvedilol  as prescribed. Monitor your blood pressure daily and keep a log. To reduce nighttime urination, try to drink less fluid in the late afternoon.  -HEART FAILURE: Heart failure means your heart doesn't pump blood as well as it should. You are currently managing this with Entresto  and carvedilol . It's important to continue these medications and monitor your blood pressure and fluid intake closely.  -MUSCLE CRAMPS: Muscle cramps are sudden, involuntary contractions of a muscle. Your cramps may be related to potassium levels or your overhydration. We will check your potassium levels. Try doing some stretching exercises before bed and monitor your water intake to avoid electrolyte imbalance.  -MOLE: You have a mole on your head with irregular borders and a raised appearance. This needs to be monitored due to the risk of skin cancer from sun exposure.  INSTRUCTIONS:  Please follow up with a dermatologist for the mole on your head. Continue to monitor your blood pressure daily and keep a log. We will check your potassium levels to investigate the cause of your muscle cramps.  Leg Cramps: What They Mean Leg cramps happen when one or more muscles tighten and there's no control over it. They can happen during exercise or when you're resting. Leg cramps are painful and can last for a few seconds to minutes. They can also come back many times before stopping. Usually, leg cramps aren't caused by a serious medical problem. Often, the cause isn't  known. Some common causes include: Problems with moving or not moving the body, like: Working your muscles too hard, such as during intense exercise. Doing the same motion over and over. Not warming up or stretching before playing sports or doing activities. Using the wrong technique or form when playing sports or doing activities. Staying in one position for a long time. Water or electrolyte balance issues, like: Not drinking enough fluids or being dehydrated. Getting sick from too much heat. Having low levels of minerals called electrolytes in your blood, like potassium and calcium . This can happen from: Pregnancy. Taking medicines that make you pee more, also called diuretic medicines. Not getting enough nutrients from your diet. Side effects of some medicines. Follow these instructions at home: Eating and drinking Eat and drink as told. Eat a healthy diet that includes plenty of nutrients to help your muscles work well. A healthy diet includes fruits and vegetables, lean protein, whole grains, and low-fat or nonfat dairy products. Drink enough fluids to keep your pee pale yellow. Drinking more water may help prevent cramps. Managing pain and muscle cramping     Massage, stretch, and relax the cramped muscle. Do this for several minutes at a time. Use ice or an ice pack as told. Place a towel between your skin and the ice. Leave the ice on for 20 minutes, 2-3 times a day. Use heat as told. Use the heat source that your provider recommends, such as a moist heat pack or a heating pad. Do this as often as told. Place a towel between your skin and the heat source. Leave the heat on for 20-30  minutes. If your skin turns red, take off the ice or heat right away to prevent skin damage. The risk of damage is higher if you can't feel pain, heat, or cold. Take hot showers or baths to help relax tight muscles. General instructions If you're having a lot of leg cramps, avoid hard workouts  for several days. Take supplements and medicines only as told. Contact a health care provider if: Your leg cramps get worse or happen more often. Your leg cramps don't get better over time. Your foot becomes cold, numb, or blue. This information is not intended to replace advice given to you by your health care provider. Make sure you discuss any questions you have with your health care provider. Document Revised: 01/09/2023 Document Reviewed: 01/09/2023 Elsevier Patient Education  2024 [employer].

## 2023-09-17 LAB — BASIC METABOLIC PANEL WITH GFR
BUN/Creatinine Ratio: 10 (ref 10–24)
BUN: 9 mg/dL (ref 8–27)
CO2: 20 mmol/L (ref 20–29)
Calcium: 9.5 mg/dL (ref 8.6–10.2)
Chloride: 104 mmol/L (ref 96–106)
Creatinine, Ser: 0.92 mg/dL (ref 0.76–1.27)
Glucose: 120 mg/dL — ABNORMAL HIGH (ref 70–99)
Potassium: 4.3 mmol/L (ref 3.5–5.2)
Sodium: 142 mmol/L (ref 134–144)
eGFR: 95 mL/min/{1.73_m2} (ref >59)

## 2023-09-30 ENCOUNTER — Encounter: Payer: Self-pay | Admitting: Physician Assistant

## 2023-09-30 ENCOUNTER — Ambulatory Visit: Admitting: Physician Assistant

## 2023-09-30 VITALS — BP 138/83 | HR 71 | Ht 69.0 in | Wt 215.0 lb

## 2023-09-30 DIAGNOSIS — M545 Low back pain, unspecified: Secondary | ICD-10-CM | POA: Diagnosis not present

## 2023-09-30 DIAGNOSIS — I10 Essential (primary) hypertension: Secondary | ICD-10-CM

## 2023-09-30 DIAGNOSIS — Z8679 Personal history of other diseases of the circulatory system: Secondary | ICD-10-CM

## 2023-09-30 DIAGNOSIS — E782 Mixed hyperlipidemia: Secondary | ICD-10-CM | POA: Diagnosis not present

## 2023-09-30 DIAGNOSIS — F1010 Alcohol abuse, uncomplicated: Secondary | ICD-10-CM

## 2023-09-30 MED ORDER — ATORVASTATIN CALCIUM 80 MG PO TABS: 80.0000 mg | ORAL_TABLET | Freq: Every day | ORAL | 1 refills | Status: AC

## 2023-09-30 MED ORDER — CYCLOBENZAPRINE HCL 10 MG PO TABS: 10.0000 mg | ORAL_TABLET | Freq: Three times a day (TID) | ORAL | 0 refills | Status: AC | PRN

## 2023-09-30 MED ORDER — CARVEDILOL 12.5 MG PO TABS: 12.5000 mg | ORAL_TABLET | Freq: Two times a day (BID) | ORAL | 1 refills | Status: AC

## 2023-09-30 MED ORDER — NALTREXONE HCL 50 MG PO TABS: 50.0000 mg | ORAL_TABLET | Freq: Every day | ORAL | 0 refills | Status: AC

## 2023-09-30 MED ORDER — ENTRESTO 49-51 MG PO TABS: 1.0000 | ORAL_TABLET | Freq: Two times a day (BID) | ORAL | 1 refills | Status: AC

## 2023-09-30 NOTE — Patient Instructions (Addendum)
 VISIT SUMMARY:  Joseph Reese, a 58 year old male with hypertension and heart failure, visited for medication refills and blood pressure management. He also reported acute lower left back pain that began on Friday and has since improved with over-the-counter medications.  YOUR PLAN:  -BACK PAIN: You have acute lower left back pain, likely due to muscle strain. This means that a muscle in your lower back is injured, causing pain. We will prescribe muscle relaxants to help with the pain, and you should continue taking Tylenol  and ibuprofen  as needed. Additionally, stretching exercises and strengthening your lower back and core muscles can help prevent future pain.  -HYPERTENSION: Your blood pressure has been variable, which means it has not been consistently controlled. To improve this, we are increasing your dose of Entresto  to 49/51 mg twice daily while continuing your current dose of carvedilol . Regularly monitor your blood pressure to ensure it stays within a healthy range. Follow up with your healthcare provider at [facility] and consider seeing a cardiologist due to your heart failure.   -GENERAL HEALTH MAINTENANCE: Routine screenings for prostate and colon cancer are important for early detection and management. We recommend a blood test for prostate cancer screening and a colonoscopy for colon cancer screening.  Acute Back Pain, Adult Acute back pain is sudden and usually short-lived. It is often caused by an injury to the muscles and tissues in the back. The injury may result from: A muscle, tendon, or ligament getting overstretched or torn. Ligaments are tissues that connect bones to each other. Lifting something improperly can cause a back strain. Wear and tear (degeneration) of the spinal disks. Spinal disks are circular tissue that provide cushioning between the bones of the spine (vertebrae). Twisting motions, such as while playing sports or doing yard work. A hit to the  back. Arthritis. You may have a physical exam, lab tests, and imaging tests to find the cause of your pain. Acute back pain usually goes away with rest and home care. Follow these instructions at home: Managing pain, stiffness, and swelling Take over-the-counter and prescription medicines only as told by your health care provider. Treatment may include medicines for pain and inflammation that are taken by mouth or applied to the skin, or muscle relaxants. Your health care provider may recommend applying ice during the first 24-48 hours after your pain starts. To do this: Put ice in a plastic bag. Place a towel between your skin and the bag. Leave the ice on for 20 minutes, 2-3 times a day. Remove the ice if your skin turns bright red. This is very important. If you cannot feel pain, heat, or cold, you have a greater risk of damage to the area. If directed, apply heat to the affected area as often as told by your health care provider. Use the heat source that your health care provider recommends, such as a moist heat pack or a heating pad. Place a towel between your skin and the heat source. Leave the heat on for 20-30 minutes. Remove the heat if your skin turns bright red. This is especially important if you are unable to feel pain, heat, or cold. You have a greater risk of getting burned. Activity  Do not stay in bed. Staying in bed for more than 1-2 days can delay your recovery. Sit up and stand up straight. Avoid leaning forward when you sit or hunching over when you stand. If you work at a desk, sit close to it so you do not need to  lean over. Keep your chin tucked in. Keep your neck drawn back, and keep your elbows bent at a 90-degree angle (right angle). Sit high and close to the steering wheel when you drive. Add lower back (lumbar) support to your car seat, if needed. Take short walks on even surfaces as soon as you are able. Try to increase the length of time you walk each day. Do not  sit, drive, or stand in one place for more than 30 minutes at a time. Sitting or standing for long periods of time can put stress on your back. Do not drive or use heavy machinery while taking prescription pain medicine. Use proper lifting techniques. When you bend and lift, use positions that put less stress on your back: [place] your knees. Keep the load close to your body. Avoid twisting. Exercise regularly as told by your health care provider. Exercising helps your back heal faster and helps prevent back injuries by keeping muscles strong and flexible. Work with a physical therapist to make a safe exercise program, as recommended by your health care provider. Do any exercises as told by your physical therapist. Lifestyle Maintain a healthy weight. Extra weight puts stress on your back and makes it difficult to have good posture. Avoid activities or situations that make you feel anxious or stressed. Stress and anxiety increase muscle tension and can make back pain worse. Learn ways to manage anxiety and stress, such as through exercise. General instructions Sleep on a firm mattress in a comfortable position. Try lying on your side with your knees slightly bent. If you lie on your back, put a pillow under your knees. Keep your head and neck in a straight line with your spine (neutral position) when using electronic equipment like smartphones or pads. To do this: Raise your smartphone or pad to look at it instead of bending your head or neck to look down. Put the smartphone or pad at the level of your face while looking at the screen. Follow your treatment plan as told by your health care provider. This may include: Cognitive or behavioral therapy. Acupuncture or massage therapy. Meditation or yoga. Contact a health care provider if: You have pain that is not relieved with rest or medicine. You have increasing pain going down into your legs or buttocks. Your pain does not improve after 2  weeks. You have pain at night. You lose weight without trying. You have a fever or chills. You develop nausea or vomiting. You develop abdominal pain. Get help right away if: You develop new bowel or bladder control problems. You have unusual weakness or numbness in your arms or legs. You feel faint. These symptoms may represent a serious problem that is an emergency. Do not wait to see if the symptoms will go away. Get medical help right away. Call your local emergency services (911 in the [place] Do not drive yourself to the hospital. Summary Acute back pain is sudden and usually short-lived. Use proper lifting techniques. When you bend and lift, use positions that put less stress on your back. Take over-the-counter and prescription medicines only as told by your health care provider, and apply heat or ice as told. This information is not intended to replace advice given to you by your health care provider. Make sure you discuss any questions you have with your health care provider. Document Revised: 07/22/2020 Document Reviewed: 07/22/2020 Elsevier Patient Education  2024 [employer].

## 2023-09-30 NOTE — Progress Notes (Signed)
 Established Patient Office Visit  Subjective   Patient ID: Joseph Reese, male    DOB: [birth year]  Age: 58 y.o. MRN: [id number]  Chief Complaint  Patient presents with   Medication Refill   Back Pain    Pt states, back went out on Friday. Alleviating method of OTC medications     Discussed the use of AI scribe software for clinical note transcription with the patient, who gave verbal consent to proceed.  History of Present Illness   Joseph Reese is a 58 year old male with hypertension and heart failure who presents for medication refills and management of blood pressure.  He is transitioning from [facility] to [facility] and requires medication refills. Blood pressure readings have been variable, with some days around 123/84 mmHg and others reaching the 150s. Current reading is 138/83 mmHg. He takes Entresto  24/26 mg twice daily and carvedilol ,   He experienced an episode of acute back pain beginning Friday, located in the lower left back without radiation. He was incapacitated on Saturday due to the pain. Tylenol  and ibuprofen  have provided relief, and he feels significantly better now, though some soreness persists. No specific injury is recalled.  Cramping in calves has resolved .    Past Medical History:  Diagnosis Date   CHF (congestive heart failure), NYHA class II (HCC)    Cocaine abuse (HCC)    Diabetes mellitus without complication (HCC)    ETOH abuse    Hypertension    Tobacco abuse    Social History   Socioeconomic History   Marital status: Single    Spouse name: Not on file   Number of children: 4   Years of education: Not on file   Highest education level: High school graduate  Occupational History   Occupation: unemployed    Comment: contract roofer--unable to work since 12/2020, d/t physical  Tobacco Use   Smoking status: Every Day    Current packs/day: 0.50    Average packs/day: 0.5 packs/day for 40.0 years (20.0 ttl pk-yrs)    Types:  Cigarettes   Smokeless tobacco: Never  Vaping Use   Vaping status: Never Used  Substance and Sexual Activity   Alcohol use: Not Currently    Alcohol/week: 18.0 standard drinks of alcohol    Types: 12 Cans of beer, 6 Shots of liquor per week    Comment: 40 years of consistent   Drug use: Not Currently    Frequency: 1.0 times per week    Types: Cocaine    Comment: Last use 04.01.2025   Sexual activity: Not on file  Other Topics Concern   Not on file  Social History Narrative   Not on file   Social Drivers of Health   Financial Resource Strain: High Risk (01/02/2023)   Overall Financial Resource Strain (CARDIA)    Difficulty of Paying Living Expenses: Hard  Food Insecurity: Food Insecurity Present (08/22/2023)   Hunger Vital Sign    Worried About Running Out of Food in the Last Year: Never true    Ran Out of Food in the Last Year: Sometimes true  Transportation Needs: No Transportation Needs (08/22/2023)   PRAPARE - Administrator, Civil Service (Medical): No    Lack of Transportation (Non-Medical): No  Physical Activity: Not on File (05/25/2019)   Received from [facility]   Physical Activity    Physical Activity: 0  Recent Concern: Physical Activity - At Risk (05/25/2019)   Received from [facility]   Physical  Activity    Physical Activity: 2  Stress: Not on File (05/25/2019)   Received from [facility]   Stress    Stress: 0  Social Connections: Not on File (05/25/2019)   Received from [facility]   Social Connections    Connectedness: 0  Intimate Partner Violence: Not At Risk (08/22/2023)   Humiliation, Afraid, Rape, and Kick questionnaire    Fear of Current or Ex-Partner: No    Emotionally Abused: No    Physically Abused: No    Sexually Abused: No   Family History  Problem Relation Age of Onset   Diabetes Mellitus II Brother    Coronary artery disease Brother 15       MI   Coronary artery disease Brother 55       MI   Coronary artery disease Brother 71       MI   No Known  Allergies  Review of Systems  Constitutional: Negative.   HENT: Negative.    Eyes: Negative.   Respiratory:  Negative for shortness of breath.   Cardiovascular:  Negative for chest pain.  Gastrointestinal: Negative.   Genitourinary:  Negative for dysuria and frequency.  Musculoskeletal:  Positive for back pain and myalgias.  Skin: Negative.   Neurological:  Negative for dizziness and weakness.  Endo/Heme/Allergies: Negative.   Psychiatric/Behavioral: Negative.        Objective:     BP 138/83 (BP Location: Left Arm, Patient Position: Sitting)   Pulse 71   Ht 5\' 9"  (1.753 m)   Wt 215 lb (97.5 kg)   SpO2 99%   BMI 31.75 kg/m  BP Readings from Last 3 Encounters:  09/30/23 138/83  09/16/23 (!) 157/78  08/17/23 (!) 154/85   Wt Readings from Last 3 Encounters:  09/30/23 215 lb (97.5 kg)  09/16/23 209 lb (94.8 kg)  08/16/23 179 lb 14.3 oz (81.6 kg)    Physical Exam Vitals and nursing note reviewed.  Musculoskeletal:     Cervical back: Normal.     Thoracic back: Normal.     Lumbar back: No swelling or tenderness. Normal range of motion.     GENERAL: Alert, cooperative, well developed, no acute distress. HEENT: Normocephalic, normal oropharynx, moist mucous membranes. CHEST: Clear to auscultation bilaterally, no wheezes, rhonchi, or crackles. CARDIOVASCULAR: Normal heart rate and rhythm, S1 and S2 normal without murmurs. ABDOMEN: Soft, non-tender, non-distended, without organomegaly, normal bowel sounds. EXTREMITIES: No cyanosis or edema. NEUROLOGICAL: Cranial nerves grossly intact, moves all extremities without gross motor or sensory deficit.    Assessment & Plan:   Problem List Items Addressed This Visit       Cardiovascular and Mediastinum   Essential hypertension - Primary   Relevant Medications   sacubitril -valsartan  (ENTRESTO ) 49-51 MG   atorvastatin  (LIPITOR) 80 MG tablet   carvedilol  (COREG ) 12.5 MG tablet     Other   Alcohol abuse   Mixed  hyperlipidemia   Relevant Medications   sacubitril -valsartan  (ENTRESTO ) 49-51 MG   atorvastatin  (LIPITOR) 80 MG tablet   carvedilol  (COREG ) 12.5 MG tablet   History of heart failure   Other Visit Diagnoses       Muscle cramps at night         Elevated blood pressure reading in office with diagnosis of hypertension       Relevant Medications   sacubitril -valsartan  (ENTRESTO ) 49-51 MG   atorvastatin  (LIPITOR) 80 MG tablet   carvedilol  (COREG ) 12.5 MG tablet     Acute left-sided low back pain without sciatica  Relevant Medications   cyclobenzaprine  (FLEXERIL ) 10 MG tablet      1. Essential hypertension (Primary) Hypertension with variable control. Current reading 138/83 mmHg. Increased Entresto  to improve control while maintaining carvedilol . Regular monitoring to avoid hypotension. - Increase Entresto  to 49/51 mg twice daily. - Continue carvedilol  at current dose. - Encourage regular blood pressure monitoring. - Follow up with healthcare provider at [facility]. - Consider cardiology follow-up due to heart failure. - sacubitril -valsartan  (ENTRESTO ) 49-51 MG; Take 1 tablet by mouth 2 (two) times daily.  Dispense: 60 tablet; Refill: 1 - carvedilol  (COREG ) 12.5 MG tablet; Take 1 tablet (12.5 mg total) by mouth 2 (two) times daily with a meal.  Dispense: 60 tablet; Refill: 1  2. Elevated blood pressure reading in office with diagnosis of hypertension   3. Acute left-sided low back pain without sciatica Acute lower left back pain likely due to muscle strain. Pain improved with Tylenol  and ibuprofen , but soreness persists. No radiation. Change in sleeping arrangements may have contributed. - Prescribe muscle relaxants for symptomatic relief. - Encourage stretching exercises to improve flexibility and prevent recurrence. - Advise on strengthening lower back and core muscles.  - cyclobenzaprine  (FLEXERIL ) 10 MG tablet; Take 1 tablet (10 mg total) by mouth 3 (three) times daily  as needed for muscle spasms.  Dispense: 30 tablet; Refill: 0  4. History of heart failure   5. Mixed hyperlipidemia Continue current regmen - atorvastatin  (LIPITOR) 80 MG tablet; Take 1 tablet (80 mg total) by mouth daily.  Dispense: 30 tablet; Refill: 1  6. Alcohol abuse Continue current regimen, patient currently in substance abuse treatment program - naltrexone  (DEPADE) 50 MG tablet; Take 1 tablet (50 mg total) by mouth daily.  Dispense: 30 tablet; Refill: 0    I have reviewed the patient's medical history (PMH, PSH, Social History, Family History, Medications, and allergies) , and have been updated if relevant. I spent 30 minutes reviewing chart and  face to face time with patient.   Return if symptoms worsen or fail to improve.    Etter Hermann Mayers, PA-C

## 2023-10-01 ENCOUNTER — Encounter: Payer: Self-pay | Admitting: Physician Assistant

## 2024-01-14 DIAGNOSIS — I509 Heart failure, unspecified: Secondary | ICD-10-CM | POA: Insufficient documentation

## 2024-01-14 DIAGNOSIS — E119 Type 2 diabetes mellitus without complications: Secondary | ICD-10-CM | POA: Insufficient documentation

## 2024-01-14 DIAGNOSIS — Z720 Tobacco use: Secondary | ICD-10-CM | POA: Insufficient documentation

## 2024-01-14 DIAGNOSIS — I10 Essential (primary) hypertension: Secondary | ICD-10-CM | POA: Insufficient documentation

## 2024-01-14 DIAGNOSIS — F1010 Alcohol abuse, uncomplicated: Secondary | ICD-10-CM | POA: Insufficient documentation

## 2024-01-14 DIAGNOSIS — F1410 Cocaine abuse, uncomplicated: Secondary | ICD-10-CM | POA: Insufficient documentation

## 2024-01-16 ENCOUNTER — Ambulatory Visit: Admitting: Cardiology

## 2024-01-17 ENCOUNTER — Encounter: Payer: Self-pay | Admitting: Cardiology

## 2024-02-19 ENCOUNTER — Ambulatory Visit: Admitting: Cardiology

## 2024-05-11 ENCOUNTER — Other Ambulatory Visit (HOSPITAL_COMMUNITY): Payer: Self-pay
